# Patient Record
Sex: Female | Born: 1935
Health system: Southern US, Community
[De-identification: ages and names within clinical notes are randomized; demographics above are authoritative.]

---

## 1997-09-23 ENCOUNTER — Ambulatory Visit (HOSPITAL_COMMUNITY): Admission: RE | Admit: 1997-09-23 | Discharge: 1997-09-23 | Payer: Self-pay | Admitting: Internal Medicine

## 1998-12-30 ENCOUNTER — Encounter: Payer: Self-pay | Admitting: Emergency Medicine

## 1998-12-30 ENCOUNTER — Emergency Department (HOSPITAL_COMMUNITY): Admission: EM | Admit: 1998-12-30 | Discharge: 1998-12-30 | Payer: Self-pay | Admitting: Emergency Medicine

## 2002-06-12 ENCOUNTER — Other Ambulatory Visit: Admission: RE | Admit: 2002-06-12 | Discharge: 2002-06-12 | Payer: Self-pay | Admitting: Internal Medicine

## 2004-05-14 ENCOUNTER — Ambulatory Visit: Payer: Self-pay | Admitting: Internal Medicine

## 2004-08-10 ENCOUNTER — Ambulatory Visit: Payer: Self-pay | Admitting: Internal Medicine

## 2004-08-17 ENCOUNTER — Ambulatory Visit: Payer: Self-pay | Admitting: Internal Medicine

## 2004-08-19 ENCOUNTER — Encounter: Admission: RE | Admit: 2004-08-19 | Discharge: 2004-08-19 | Payer: Self-pay | Admitting: Internal Medicine

## 2004-09-15 ENCOUNTER — Ambulatory Visit (HOSPITAL_COMMUNITY): Admission: RE | Admit: 2004-09-15 | Discharge: 2004-09-15 | Payer: Self-pay | Admitting: Urology

## 2004-10-23 ENCOUNTER — Ambulatory Visit (HOSPITAL_COMMUNITY): Admission: RE | Admit: 2004-10-23 | Discharge: 2004-10-23 | Payer: Self-pay | Admitting: Urology

## 2004-10-23 ENCOUNTER — Ambulatory Visit (HOSPITAL_BASED_OUTPATIENT_CLINIC_OR_DEPARTMENT_OTHER): Admission: RE | Admit: 2004-10-23 | Discharge: 2004-10-23 | Payer: Self-pay | Admitting: Urology

## 2004-12-04 ENCOUNTER — Ambulatory Visit (HOSPITAL_COMMUNITY): Admission: RE | Admit: 2004-12-04 | Discharge: 2004-12-04 | Payer: Self-pay | Admitting: Urology

## 2005-02-21 ENCOUNTER — Emergency Department (HOSPITAL_COMMUNITY): Admission: EM | Admit: 2005-02-21 | Discharge: 2005-02-21 | Payer: Self-pay | Admitting: Emergency Medicine

## 2005-04-09 ENCOUNTER — Ambulatory Visit: Payer: Self-pay | Admitting: Internal Medicine

## 2005-04-09 ENCOUNTER — Ambulatory Visit (HOSPITAL_COMMUNITY): Admission: RE | Admit: 2005-04-09 | Discharge: 2005-04-09 | Payer: Self-pay | Admitting: Specialist

## 2005-05-04 ENCOUNTER — Ambulatory Visit: Payer: Self-pay | Admitting: Internal Medicine

## 2005-05-26 ENCOUNTER — Ambulatory Visit: Payer: Self-pay | Admitting: Internal Medicine

## 2005-11-08 ENCOUNTER — Ambulatory Visit: Payer: Self-pay | Admitting: Internal Medicine

## 2007-01-03 ENCOUNTER — Encounter: Payer: Self-pay | Admitting: Internal Medicine

## 2007-01-03 DIAGNOSIS — K219 Gastro-esophageal reflux disease without esophagitis: Secondary | ICD-10-CM | POA: Insufficient documentation

## 2007-01-03 DIAGNOSIS — N951 Menopausal and female climacteric states: Secondary | ICD-10-CM | POA: Insufficient documentation

## 2007-01-03 DIAGNOSIS — N1330 Unspecified hydronephrosis: Secondary | ICD-10-CM

## 2007-01-04 ENCOUNTER — Encounter: Payer: Self-pay | Admitting: Internal Medicine

## 2007-01-04 ENCOUNTER — Ambulatory Visit: Payer: Self-pay | Admitting: Internal Medicine

## 2007-05-10 ENCOUNTER — Telehealth (INDEPENDENT_AMBULATORY_CARE_PROVIDER_SITE_OTHER): Payer: Self-pay | Admitting: *Deleted

## 2007-05-11 ENCOUNTER — Ambulatory Visit: Payer: Self-pay | Admitting: Internal Medicine

## 2009-06-05 ENCOUNTER — Encounter: Payer: Self-pay | Admitting: Internal Medicine

## 2009-07-15 ENCOUNTER — Telehealth: Payer: Self-pay | Admitting: Internal Medicine

## 2009-07-15 ENCOUNTER — Ambulatory Visit: Payer: Self-pay | Admitting: Internal Medicine

## 2009-07-15 LAB — CONVERTED CEMR LAB
ALT: 13 units/L (ref 0–35)
AST: 21 units/L (ref 0–37)
Albumin: 3.8 g/dL (ref 3.5–5.2)
Basophils Absolute: 0.1 10*3/uL (ref 0.0–0.1)
Chloride: 109 meq/L (ref 96–112)
Eosinophils Absolute: 0.2 10*3/uL (ref 0.0–0.7)
Eosinophils Absolute: 0.2 10*3/uL (ref 0.0–0.7)
GFR calc non Af Amer: 74.88 mL/min (ref >60)
Glucose, Bld: 100 mg/dL — ABNORMAL HIGH (ref 70–99)
HCT: 25.3 % — ABNORMAL LOW (ref 36.0–46.0)
HCT: 27 % — ABNORMAL LOW (ref 36.0–46.0)
Hemoglobin: 7.5 g/dL — CL (ref 12.0–15.0)
Hemoglobin: 7.4 g/dL — ABNORMAL LOW (ref 12.0–15.0)
MCHC: 29.6 g/dL — ABNORMAL LOW (ref 30.0–36.0)
MCHC: 27.4 g/dL — ABNORMAL LOW (ref 30.0–36.0)
MCV: 67.3 fL — ABNORMAL LOW (ref 78.0–100.0)
Monocytes Relative: 14 % — ABNORMAL HIGH (ref 3–12)
Neutro Abs: 2.7 10*3/uL (ref 1.7–7.7)
Neutrophils Relative %: 54 % (ref 43–77)
Platelets: 356 10*3/uL (ref 150–400)
RBC: 4.01 M/uL (ref 3.87–5.11)
Sodium: 143 meq/L (ref 135–145)
TSH: 1.07 microintl units/mL (ref 0.35–5.50)
Total Protein: 6.9 g/dL (ref 6.0–8.3)

## 2009-07-16 ENCOUNTER — Telehealth: Payer: Self-pay | Admitting: Internal Medicine

## 2009-07-17 ENCOUNTER — Ambulatory Visit: Payer: Self-pay | Admitting: Internal Medicine

## 2009-07-17 DIAGNOSIS — D500 Iron deficiency anemia secondary to blood loss (chronic): Secondary | ICD-10-CM

## 2009-07-17 LAB — CONVERTED CEMR LAB
Basophils Absolute: 0 10*3/uL (ref 0.0–0.1)
Basophils Relative: 0 % (ref 0.0–3.0)
Eosinophils Relative: 4.4 % (ref 0.0–5.0)
Ferritin: 4 ng/mL — ABNORMAL LOW (ref 10.0–291.0)
Hemoglobin: 7.5 g/dL — CL (ref 12.0–15.0)
Lymphocytes Relative: 17.6 % (ref 12.0–46.0)
MCHC: 29.4 g/dL — ABNORMAL LOW (ref 30.0–36.0)
Monocytes Absolute: 0.4 10*3/uL (ref 0.1–1.0)
Monocytes Relative: 5.8 % (ref 3.0–12.0)
Neutro Abs: 4.6 10*3/uL (ref 1.4–7.7)
Neutrophils Relative %: 72.2 % (ref 43.0–77.0)
Platelets: 379 10*3/uL (ref 150.0–400.0)
RBC: 3.83 M/uL — ABNORMAL LOW (ref 3.87–5.11)

## 2009-07-18 ENCOUNTER — Telehealth: Payer: Self-pay | Admitting: Internal Medicine

## 2009-07-18 ENCOUNTER — Ambulatory Visit: Payer: Self-pay | Admitting: Gastroenterology

## 2009-07-18 ENCOUNTER — Encounter: Payer: Self-pay | Admitting: Gastroenterology

## 2009-07-18 DIAGNOSIS — R195 Other fecal abnormalities: Secondary | ICD-10-CM

## 2009-07-18 DIAGNOSIS — D509 Iron deficiency anemia, unspecified: Secondary | ICD-10-CM

## 2009-07-21 ENCOUNTER — Ambulatory Visit: Payer: Self-pay | Admitting: Gastroenterology

## 2009-07-21 ENCOUNTER — Encounter: Payer: Self-pay | Admitting: Physician Assistant

## 2009-07-21 LAB — CONVERTED CEMR LAB
Iron: 10 ug/dL — ABNORMAL LOW (ref 42–145)
Lymphocytes Relative: 22.8 % (ref 12.0–46.0)
MCHC: 30.4 g/dL (ref 30.0–36.0)
MCV: 65.7 fL — ABNORMAL LOW (ref 78.0–100.0)
Monocytes Relative: 10.6 % (ref 3.0–12.0)
Platelets: 352 10*3/uL (ref 150.0–400.0)
RBC: 3.84 M/uL — ABNORMAL LOW (ref 3.87–5.11)
RDW: 20.6 % — ABNORMAL HIGH (ref 11.5–14.6)
WBC: 6.1 10*3/uL (ref 4.5–10.5)

## 2009-07-22 ENCOUNTER — Telehealth: Payer: Self-pay | Admitting: Gastroenterology

## 2009-07-24 ENCOUNTER — Encounter: Payer: Self-pay | Admitting: Gastroenterology

## 2009-08-19 ENCOUNTER — Ambulatory Visit: Payer: Self-pay | Admitting: Gastroenterology

## 2009-08-22 LAB — CONVERTED CEMR LAB
Basophils Relative: 1.8 % (ref 0.0–3.0)
Eosinophils Absolute: 0.2 10*3/uL (ref 0.0–0.7)
Eosinophils Relative: 3.6 % (ref 0.0–5.0)
Ferritin: 5.8 ng/mL — ABNORMAL LOW (ref 10.0–291.0)
Folate: >20 ng/mL
Lymphs Abs: 1.3 10*3/uL (ref 0.7–4.0)
Monocytes Absolute: 0.6 10*3/uL (ref 0.1–1.0)
Monocytes Relative: 9.5 % (ref 3.0–12.0)
RBC: 4.45 M/uL (ref 3.87–5.11)
Vitamin B-12: 613 pg/mL (ref 211–911)

## 2009-08-25 ENCOUNTER — Encounter: Payer: Self-pay | Admitting: Gastroenterology

## 2009-08-25 ENCOUNTER — Ambulatory Visit: Payer: Self-pay | Admitting: Internal Medicine

## 2009-08-25 DIAGNOSIS — B029 Zoster without complications: Secondary | ICD-10-CM | POA: Insufficient documentation

## 2009-09-01 ENCOUNTER — Ambulatory Visit: Payer: Self-pay | Admitting: Internal Medicine

## 2009-09-02 ENCOUNTER — Encounter (HOSPITAL_COMMUNITY): Admission: RE | Admit: 2009-09-02 | Discharge: 2009-12-01 | Payer: Self-pay | Admitting: Surgery

## 2009-09-02 ENCOUNTER — Telehealth: Payer: Self-pay | Admitting: Gastroenterology

## 2009-09-05 ENCOUNTER — Ambulatory Visit: Payer: Self-pay | Admitting: Internal Medicine

## 2009-09-05 ENCOUNTER — Telehealth: Payer: Self-pay | Admitting: Internal Medicine

## 2009-09-08 ENCOUNTER — Telehealth: Payer: Self-pay

## 2009-09-10 ENCOUNTER — Encounter: Payer: Self-pay | Admitting: Gastroenterology

## 2009-09-11 ENCOUNTER — Encounter: Payer: Self-pay | Admitting: Internal Medicine

## 2009-09-11 ENCOUNTER — Telehealth: Payer: Self-pay | Admitting: Internal Medicine

## 2009-09-15 ENCOUNTER — Ambulatory Visit: Payer: Self-pay | Admitting: Internal Medicine

## 2009-09-25 ENCOUNTER — Telehealth: Payer: Self-pay | Admitting: Internal Medicine

## 2009-09-29 ENCOUNTER — Ambulatory Visit: Payer: Self-pay | Admitting: Family Medicine

## 2009-10-07 ENCOUNTER — Ambulatory Visit: Payer: Self-pay | Admitting: Internal Medicine

## 2009-10-07 DIAGNOSIS — B0229 Other postherpetic nervous system involvement: Secondary | ICD-10-CM | POA: Insufficient documentation

## 2009-10-20 ENCOUNTER — Ambulatory Visit: Payer: Self-pay | Admitting: Physician Assistant

## 2009-10-20 LAB — CONVERTED CEMR LAB: Transferrin: 216.5 mg/dL (ref 212.0–360.0)

## 2009-10-21 ENCOUNTER — Ambulatory Visit: Payer: Self-pay | Admitting: Internal Medicine

## 2009-10-22 ENCOUNTER — Telehealth: Payer: Self-pay | Admitting: Internal Medicine

## 2009-10-28 ENCOUNTER — Telehealth: Payer: Self-pay

## 2009-11-13 ENCOUNTER — Telehealth: Payer: Self-pay

## 2009-11-14 ENCOUNTER — Encounter: Payer: Self-pay | Admitting: Internal Medicine

## 2010-01-09 ENCOUNTER — Ambulatory Visit: Payer: Self-pay | Admitting: Internal Medicine

## 2010-01-09 LAB — CONVERTED CEMR LAB
BUN: 16 mg/dL (ref 6–23)
CO2: 30 meq/L (ref 19–32)
Chloride: 108 meq/L (ref 96–112)
Creatinine, Ser: 0.7 mg/dL (ref 0.4–1.2)
Eosinophils Relative: 3.2 % (ref 0.0–5.0)
GFR calc non Af Amer: 91.76 mL/min (ref >60)
Glucose, Bld: 95 mg/dL (ref 70–99)
HCT: 37.6 % (ref 36.0–46.0)
Lymphocytes Relative: 20 % (ref 12.0–46.0)
MCV: 90.8 fL (ref 78.0–100.0)
Monocytes Relative: 9.1 % (ref 3.0–12.0)
Neutrophils Relative %: 66.8 % (ref 43.0–77.0)
Platelets: 221 10*3/uL (ref 150.0–400.0)
Potassium: 4.8 meq/L (ref 3.5–5.1)
RBC: 4.14 M/uL (ref 3.87–5.11)

## 2010-05-04 ENCOUNTER — Ambulatory Visit: Payer: Self-pay | Admitting: Internal Medicine

## 2010-07-28 NOTE — Assessment & Plan Note (Signed)
Summary: COUGH/FILL IN PAPER FOR DMV/CJR   Vital Signs:  Patient profile:   76 year old female Weight:      112 pounds Temp:     98.5 degrees F oral Pulse rate:   120 / minute Pulse rhythm:   regular BP sitting:   140 / 86  (left arm) Cuff size:   regular  Vitals Entered By: Raechel Ache, RN (July 15, 2009 3:26 PM) CC: Needs form for DMV. C/o dry cough x 1 month.   CC:  Needs form for DMV. C/o dry cough x 1 month..  History of Present Illness: 76 year old patient who is seen today with the chief complaint of cough for 3 weeks duration.  Cough is  nonproductive, but is refractory,  bothersome and interfering with sleep, and work.  denies any shortness of breath, chest pain, fever or chills.  Her main reason for the visit today is DMV form completion.  She does have a history of visual impairmentand has seen Dr. Ashley Royalty in the past.  It is unclear that she has any medical conditions that interfere with her ability to safely operate a motor vehicle.  Her last visit here was over two years ago for a DMV exam.  She takes no chronic medications except OTC omeprazole.  Preventive Screening-Counseling & Management  Alcohol-Tobacco     Smoking Status: never  Allergies (verified): 1)  ! Bactrim (Sulfamethoxazole-Trimethoprim) 2)  ! Bicillin L-A (Penicillin G Benzathine) 3)  ! Biaxin (Clarithromycin)  Past History:  Past Medical History: Reviewed history from 01/03/2007 and no changes required. Menopausal syndrome GERD Bilateral hydronephrosis  Past Surgical History: Tonsillectomy G1P1A0  Family History: followed is 34, coronary artery disease mother died age 35, colon cancer one brother two sisters  Social History: Reviewed history and no changes required. Never Smoked one child, one grandchild  Review of Systems       The patient complains of severe indigestion/heartburn.  The patient denies anorexia, fever, weight loss, weight gain, vision loss, decreased  hearing, hoarseness, chest pain, syncope, dyspnea on exertion, peripheral edema, prolonged cough, headaches, hemoptysis, abdominal pain, melena, hematochezia, hematuria, incontinence, genital sores, muscle weakness, suspicious skin lesions, transient blindness, difficulty walking, depression, unusual weight change, abnormal bleeding, enlarged lymph nodes, angioedema, and breast masses.    Physical Exam  General:  underweight appearing.  anxious in no acute distress blood pressure 130/80underweight appearing.   Head:  Normocephalic and atraumatic without obvious abnormalities. No apparent alopecia or balding. Eyes:  No corneal or conjunctival inflammation noted. EOMI. Perrla. Funduscopic exam benign, without hemorrhages, exudates or papilledema. Vision grossly normal. Ears:  External ear exam shows no significant lesions or deformities.  Otoscopic examination reveals clear canals, tympanic membranes are intact bilaterally without bulging, retraction, inflammation or discharge. Hearing is grossly normal bilaterally. Nose:  External nasal examination shows no deformity or inflammation. Nasal mucosa are pink and moist without lesions or exudates. Mouth:  Oral mucosa and oropharynx without lesions or exudates.  Teeth in good repair. Neck:  No deformities, masses, or tenderness noted. Chest Wall:  No deformities, masses, or tenderness noted. Lungs:  Normal respiratory effort, chest expands symmetrically. Lungs are clear to auscultation, no crackles or wheezes. O2 saturation 98% Heart:  Normal rate and regular rhythm. S1 and S2 normal without gallop, murmur, click, rub or other extra sounds.  pulse rate 104 Abdomen:  Bowel sounds positive,abdomen soft and non-tender without masses, organomegaly or hernias noted. Msk:  osteoarthritic  changes involving the small joints of the  hands Pulses:  R and L carotid,radial,femoral,dorsalis pedis and posterior tibial pulses are full and equal  bilaterally Extremities:  No clubbing, cyanosis, edema, or deformity noted with normal full range of motion of all joints.   Skin:  Intact without suspicious lesions or rashes Cervical Nodes:  No lymphadenopathy noted   Impression & Recommendations:  Problem # 1:  HYDRONEPHROSIS, BILATERAL (ICD-591)  Problem # 2:  GERD (ICD-530.81)  Her updated medication list for this problem includes:    Prilosec Otc 20 Mg Tbec (Omeprazole magnesium) .Marland Kitchen... 1 once daily    Her updated medication list for this problem includes:    Prilosec Otc 20 Mg Tbec (Omeprazole magnesium) .Marland Kitchen... 1 once daily  Problem # 3:  MENOPAUSAL SYNDROME (ICD-627.2)  Complete Medication List: 1)  Prilosec Otc 20 Mg Tbec (Omeprazole magnesium) .Marland Kitchen.. 1 once daily 2)  Hydrocodone-homatropine 5-1.5 Mg/30ml Syrp (Hydrocodone-homatropine) .... One tsp every 6 hours for cough  Other Orders: Venipuncture ([address]) TLB-BMP (Basic Metabolic Panel-BMET) (80048-METABOL) TLB-CBC Platelet - w/Differential (85025-CBCD) TLB-Hepatic/Liver Function Pnl (80076-HEPATIC) TLB-TSH (Thyroid Stimulating Hormone) (84443-TSH)  Patient Instructions: 1)  Please schedule a follow-up appointment in 4 months CPX 2)  Limit your Sodium (Salt) to less than 2 grams a day(slightly less than 1/2 a teaspoon) to prevent fluid retention, swelling, or worsening of symptoms. 3)  Avoid foods high in acid (tomatoes, citrus juices, spicy foods). Avoid eating within two hours of lying down or before exercising. Do not over eat; try smaller more frequent meals. Elevate head of bed twelve inches when sleeping. 4)  It is important that you exercise regularly at least 20 minutes 5 times a week. If you develop chest pain, have severe difficulty breathing, or feel very tired , stop exercising immediately and seek medical attention. 5)  Take calcium +Vitamin D daily. Prescriptions: HYDROCODONE-HOMATROPINE 5-1.5 MG/5ML SYRP (HYDROCODONE-HOMATROPINE) one tsp every 6 hours for  cough  #6 oz x 0   Entered and Authorized by:   Gordy Savers  MD   Signed by:   Gordy Savers  MD on 07/15/2009   Method used:   Print then Give to Patient   RxID:   [id number]   Appended Document: Orders Update    Clinical Lists Changes  Orders: Added new Test order of T- * Misc. Laboratory test [phone]) - Signed

## 2010-07-28 NOTE — Progress Notes (Signed)
Summary: Critical value  Phone Note Other Incoming   Caller: Lab Reason for Call: Discuss lab or test results Action Taken: Information Sent Summary of Call: Critical value: Hgb:  7.5 gm Hct:  25.6 Initial call taken by: Lynann Beaver CMA,  July 18, 2009 2:02 PM

## 2010-07-28 NOTE — Assessment & Plan Note (Signed)
Summary: 1 month fup//ccm   Vital Signs:  Patient profile:   76 year old female Weight:      114 pounds Temp:     98.1 degrees F oral BP sitting:   140 / 80  (right arm) Cuff size:   regular  Vitals Entered By: Duard Brady LPN (October 21, 2009 3:17 PM) CC: 1 mos rov - no better - "cant work" Is Patient Diabetic? No   Primary Care Provider:  Eleonore Chiquito MD  CC:  1 mos rov - no better - "cant work".  History of Present Illness: a 76 year old patient who is in today for severe postherpetic neuralgia.  Two scheduled to return to work today, but she is unable due to her chest wall pain.  She remains fairly miserable and states that she can't even hardly bear to light touch of clothing to the area.  Her rash has totally resolved.  She is on low-dose Neurontin, but has been unable to tolerate any higher dosing due to weakness and dizziness  Allergies: 1)  ! Bactrim (Sulfamethoxazole-Trimethoprim) 2)  ! Bicillin L-A (Penicillin G Benzathine) 3)  ! Biaxin (Clarithromycin)  Past History:  Past Medical History: Menopausal syndrome GERD Bilateral hydronephrosis microcytic anemia/NEW 1/11 shingles February 2011 postherpetic neuralgia  Physical Exam  General:  alert  underweight appearing.  normal blood pressure, weight 114underweight appearing.   Chest Wall:  no dermatitis   Impression & Recommendations:  Problem # 1:  POSTHERPETIC NEURALGIA (ICD-053.19)  Problem # 2:  ANEMIA-IRON DEFICIENCY (ICD-280.9)  Her updated medication list for this problem includes:    Nu-iron 150 Mg Caps (Polysaccharide iron complex) .Marland Kitchen... Take 1 tab twice daily  Her updated medication list for this problem includes:    Nu-iron 150 Mg Caps (Polysaccharide iron complex) .Marland Kitchen... Take 1 tab twice daily  Complete Medication List: 1)  Prilosec Otc 20 Mg Tbec (Omeprazole magnesium) .Marland Kitchen.. 1 once daily 2)  Hydrocodone-homatropine 5-1.5 Mg/34ml Syrp (Hydrocodone-homatropine) .... One tsp every 6  hours for cough 3)  Nu-iron 150 Mg Caps (Polysaccharide iron complex) .... Take 1 tab twice daily 4)  Dexilant 60 Mg Cpdr (Dexlansoprazole) .Marland Kitchen.. 1 by mouth qd 5)  Valtrex 1 Gm Tabs (Valacyclovir hcl) .... One tid 6)  Hydrocodone-acetaminophen 5-500 Mg Tabs (Hydrocodone-acetaminophen) .... One or two every 6 hours for pain 7)  Lidoderm 5 % Ptch (Lidocaine) .... Apply every 12-24 hrs 8)  Gabapentin 100 Mg Caps (Gabapentin) .... One capsule 3 times daily 9)  Nortriptyline Hcl 10 Mg Caps (Nortriptyline hcl) .... One at bedtime  Patient Instructions: 1)  Please schedule a follow-up appointment in 3 months. Prescriptions: NORTRIPTYLINE HCL 10 MG CAPS (NORTRIPTYLINE HCL) one at bedtime  #90 x 0   Entered and Authorized by:   Gordy Savers  MD   Signed by:   Gordy Savers  MD on 10/21/2009   Method used:   [id number]       [address]       [place], [place]  [address]       Ph: [phone] or [phone]       Fax: [phone]   RxID:   [id number]

## 2010-07-28 NOTE — Progress Notes (Signed)
  Phone Note Outgoing Call   Call placed by: Raechel Ache, RN,  July 16, 2009 8:22 AM Call placed to: k Summary of Call: [facility] ASAP  Follow-up for Phone Call        no longer at work #- not employed there. Follow-up by: Raechel Ache, RN,  July 16, 2009 12:30 PM  Additional Follow-up for Phone Call Additional follow up Details #1::        needs appt with Dr Kirtland Bouchard ASAP! Additional Follow-up by: Raechel Ache, RN,  July 16, 2009 12:30 PM    Additional Follow-up for Phone Call Additional follow up Details #2::    left message on machine- call us ASAP- important! Follow-up by: Raechel Ache, RN,  July 17, 2009 9:49 AM  Additional Follow-up for Phone Call Additional follow up Details #3:: Details for Additional Follow-up Action Taken: patient called- will come in today for appt 3:30 - will call if she cannot get off work. I stressed importance of visit Additional Follow-up by: Raechel Ache, RN,  July 17, 2009 12:39 PM

## 2010-07-28 NOTE — Assessment & Plan Note (Signed)
Summary: ? shingles//ccm   Vital Signs:  Patient profile:   76 year old female Temp:     98 degrees F oral BP sitting:   160 / 90  (left arm) Cuff size:   regular  Vitals Entered By: Sid Falcon LPN (September 29, 1608 5:12 PM) CC: Shingles, ongoing pain   History of Present Illness: Followup shingles. Diagnosed last month. Involvement of left thoracic region around toward the breast. Severe neuropathic pain. Currently on gabapentin 200 mg 3 times daily with poor pain control. Also using hydrocodone with minimal improvement and Cymbalta 30 mg daily. Has been out of work for several weeks. Patient has burning ,stinging pain which is severe at times.  Poor sleep sec to pain.  Allergies: 1)  ! Bactrim (Sulfamethoxazole-Trimethoprim) 2)  ! Bicillin L-A (Penicillin G Benzathine) 3)  ! Biaxin (Clarithromycin)  Past History:  Past Medical History: Last updated: 08/25/2009 Menopausal syndrome GERD Bilateral hydronephrosis microcytic anemia/NEW 1/11 shingles February 2011 PMH reviewed for relevance  Physical Exam  General:  Well-developed,well-nourished,in no acute distress; alert,appropriate and cooperative throughout examination Lungs:  Normal respiratory effort, chest expands symmetrically. Lungs are clear to auscultation, no crackles or wheezes. Heart:  normal rate and regular rhythm.   Skin:  healing rash left thoracic region   Impression & Recommendations:  Problem # 1:  SHINGLES (ICD-053.9) patient has persistent postherpetic neuralgia. Titrate gabapentin 300 mg t.i.d.  Complete Medication List: 1)  Prilosec Otc 20 Mg Tbec (Omeprazole magnesium) .Marland Kitchen.. 1 once daily 2)  Hydrocodone-homatropine 5-1.5 Mg/59ml Syrp (Hydrocodone-homatropine) .... One tsp every 6 hours for cough 3)  Nu-iron 150 Mg Caps (Polysaccharide iron complex) .... Take 1 tab twice daily 4)  Dexilant 60 Mg Cpdr (Dexlansoprazole) .Marland Kitchen.. 1 by mouth qd 5)  Valtrex 1 Gm Tabs (Valacyclovir hcl) .... One tid 6)   Hydrocodone-acetaminophen 5-500 Mg Tabs (Hydrocodone-acetaminophen) .... One or two every 6 hours for pain 7)  Gabapentin 300 Mg Caps (Gabapentin) .... One by mouth three times a day 8)  Lidoderm 5 % Ptch (Lidocaine) .... Apply every 12-24 hrs  Patient Instructions: 1)  Increase gabapentin to 300 mg 3 times daily. 2)  Continue hydrocodone as needed at nighttime for relief Prescriptions: GABAPENTIN 300 MG CAPS (GABAPENTIN) one by mouth three times a day  #90 x 3   Entered and Authorized by:   Evelena Peat MD   Signed by:   Evelena Peat MD on 09/29/2009   Method used:   [facility]  [id number]       [address]       [place], [place]  [address]       Ph: [phone] or [phone]       Fax: [phone]   RxID:   [id number]

## 2010-07-28 NOTE — Letter (Signed)
Summary: Return to Work Letter  Return to Work Letter   Imported By: Maryln Gottron 11/18/2009 15:22:10  _____________________________________________________________________  External Attachment:    Type:   Image     Comment:   External Document

## 2010-07-28 NOTE — Op Note (Signed)
Summary: Infusion/[facility]  Infusion/[facility]   Imported By: Sherian Rein 09/22/2009 07:49:50  _____________________________________________________________________  External Attachment:    Type:   Image     Comment:   External Document

## 2010-07-28 NOTE — Progress Notes (Signed)
Summary: work release  work release faxed and pt aware original ready for pick up. KIK

## 2010-07-28 NOTE — Letter (Signed)
Summary: Medical Review for [facility]  Medical Review for DMV   Imported By: Maryln Gottron 09/16/2009 12:22:33  _____________________________________________________________________  External Attachment:    Type:   Image     Comment:   External Document

## 2010-07-28 NOTE — Letter (Signed)
Summary: Out of Work  Adult nurse at [employer]  [address]   [place], [place] [address]   Phone: [phone]  Fax: [phone]    September 15, 2009   Employee:  Neill Loft    To Whom It May Concern:   For Medical reasons, please excuse the above named employee from work for the following dates:  Start:   09-01-09  End:   09-30-09  If you need additional information, please feel free to contact our office.         Sincerely,    Gordy Savers  MD

## 2010-07-28 NOTE — Assessment & Plan Note (Signed)
Summary: 3 mo rov/mm/pt rescd//ccm----PT [facility] // RS---PT [facility]// RS  aand a the and and a the and a chief in the we will be a  Vital Signs:  Patient profile:   76 year old female Weight:      116 pounds Temp:     98.0 degrees F oral BP sitting:   112 / 80  (right arm) Cuff size:   regular  Vitals Entered By: Duard Brady LPN (January 09, 2010 1:37 PM) Lyndee Leo, [place] andCC: 3 mos rov - doing well Is Patient Diabetic? No   Primary Care Provider:  Eleonore Chiquito MD  CC:  3 mos rov - doing well.  History of Present Illness: a 76 year old patient who is seen today for follow-up.  She has suffered for several months with postherpetic neuralgia.  This now has resolved, and she is off medication.  She has a history of iron deficiency anemia, urinary retention with history of bilateral hydronephrosis.  She feels well today  Allergies: 1)  ! Bactrim (Sulfamethoxazole-Trimethoprim) 2)  ! Bicillin L-A (Penicillin G Benzathine) 3)  ! Biaxin (Clarithromycin)  Past History:  Past Medical History: Menopausal syndrome GERD Bilateral hydronephrosis microcytic anemia/NEW 1/11 shingles February 2011 postherpetic neuralgia chronic urinary retention  Past Surgical History: Tonsillectomy G1P1A0 no prior colonoscopies-declines urological procedure for hydronephrosis  Review of Systems  The patient denies anorexia, fever, weight loss, weight gain, vision loss, decreased hearing, hoarseness, chest pain, syncope, dyspnea on exertion, peripheral edema, prolonged cough, headaches, hemoptysis, abdominal pain, melena, hematochezia, severe indigestion/heartburn, hematuria, incontinence, genital sores, muscle weakness, suspicious skin lesions, transient blindness, difficulty walking, depression, unusual weight change, abnormal bleeding, enlarged lymph nodes, angioedema, and breast masses.    Physical Exam  General:  underweight appearing. normal blood pressureunderweight appearing.   Head:   Normocephalic and atraumatic without obvious abnormalities. No apparent alopecia or balding. Eyes:  No corneal or conjunctival inflammation noted. EOMI. Perrla. Funduscopic exam benign, without hemorrhages, exudates or papilledema. Vision grossly normal. Mouth:  Oral mucosa and oropharynx without lesions or exudates.  Teeth in good repair. Neck:  No deformities, masses, or tenderness noted. Lungs:  Normal respiratory effort, chest expands symmetrically. Lungs are clear to auscultation, no crackles or wheezes. Heart:  Normal rate and regular rhythm. S1 and S2 normal without gallop, murmur, click, rub or other extra sounds. Abdomen:  Bowel sounds positive,abdomen soft and non-tender without masses, organomegaly or hernias noted. Msk:  No deformity or scoliosis noted of thoracic or lumbar spine.     Impression & Recommendations:  Problem # 1:  POSTHERPETIC NEURALGIA (ICD-053.19)  Problem # 2:  ANEMIA-IRON DEFICIENCY (ICD-280.9)  Her updated medication list for this problem includes:    Nu-iron 150 Mg Caps (Polysaccharide iron complex) .Marland Kitchen... Take 1 tab twice daily  Her updated medication list for this problem includes:    Nu-iron 150 Mg Caps (Polysaccharide iron complex) .Marland Kitchen... Take 1 tab twice daily  Complete Medication List: 1)  Prilosec Otc 20 Mg Tbec (Omeprazole magnesium) .Marland Kitchen.. 1 once daily 2)  Nu-iron 150 Mg Caps (Polysaccharide iron complex) .... Take 1 tab twice daily 3)  Dexilant 60 Mg Cpdr (Dexlansoprazole) .Marland Kitchen.. 1 by mouth qd  Other Orders: Venipuncture ([address]) TLB-CBC Platelet - w/Differential (85025-CBCD) TLB-BMP (Basic Metabolic Panel-BMET) (80048-METABOL)  Patient Instructions: 1)  Please schedule a follow-up appointment in 6 months. 2)  Avoid foods high in acid (tomatoes, citrus juices, spicy foods). Avoid eating within two hours of lying down or before exercising. Do not over eat; try smaller more  frequent meals. Elevate head of bed twelve inches when  sleeping. Prescriptions: NU-IRON 150 MG CAPS (POLYSACCHARIDE IRON COMPLEX) Take 1 tab twice daily  #60 x 3   Entered and Authorized by:   Gordy Savers  MD   Signed by:   Gordy Savers  MD on 01/09/2010   Method used:   [facility]  [id number]       [address]       [place], [place]  [address]       Ph: [phone] or [phone]       Fax: [phone]   RxID:   [id number] DEXILANT 60 MG CPDR (DEXLANSOPRAZOLE) 1 by mouth qd  #30 x 11   Entered and Authorized by:   Gordy Savers  MD   Signed by:   Gordy Savers  MD on 01/09/2010   Method used:   [facility]        [facility]  [address] [id number]       [address]       [place], [place]  [address]       Ph: [phone] or [phone]       Fax: [phone]   RxID:   [id number]

## 2010-07-28 NOTE — Letter (Signed)
Summary: Out of Work  Adult nurse at [employer]  [address]   [place], [place] [address]   Phone: [phone]  Fax: [phone]    July 15, 2009   Employee:  Neill Loft    To Whom It May Concern:   For Medical reasons, please excuse the above named employee from work for the following dates:  Start:   07-15-2009  End:   07-16-2009  If you need additional information, please feel free to contact our office.         Sincerely,    Gordy Savers  MD

## 2010-07-28 NOTE — Letter (Signed)
Summary: Out of Work  Adult nurse at [employer]  [address]   [place], [place] [address]   Phone: [phone]  Fax: [phone]    January 09, 2010   Employee:  Neill Loft    To Whom It May Concern:   For Medical reasons, please excuse the above named employee from work for the following dates:  Start:   01-09-2010  End:   01-10-2010  If you need additional information, please feel free to contact our office.         Sincerely,    Gordy Savers  MD

## 2010-07-28 NOTE — Progress Notes (Signed)
  Phone Note From Other Clinic   Summary of Call: Notified by lab that patient has severe anemia with H/H 7.5/25.3.  Called and left patient a message Initial call taken by: Gordy Savers  MD,  July 15, 2009 5:30 PM

## 2010-07-28 NOTE — Assessment & Plan Note (Signed)
Summary: discuss pneumonia shots/dm  Nurse Visit   Allergies: 1)  ! Bactrim (Sulfamethoxazole-Trimethoprim) 2)  ! Bicillin L-A (Penicillin G Benzathine) 3)  ! Biaxin (Clarithromycin)  Immunizations Administered:  Pneumonia Vaccine:    Vaccine Type: Pneumovax    Site: right deltoid    Mfr: Merck    Dose: 0.5 ml    Route: IM    Given by: Duard Brady LPN    Exp. Date: 10/20/2011    Lot #: [id number]    VIS given: 06/02/09 version given May 04, 2010.    Physician counseled: yes  Orders Added: 1)  Pneumococcal Vaccine [90732] 2)  Admin 1st Vaccine [phone]

## 2010-07-28 NOTE — Miscellaneous (Signed)
Summary: RX NuIron  Clinical Lists Changes  Medications: Added new medication of NU-IRON 150 MG CAPS (POLYSACCHARIDE IRON COMPLEX) Take 1 tab twice daily - Signed Rx of NU-IRON 150 MG CAPS (POLYSACCHARIDE IRON COMPLEX) Take 1 tab twice daily;  #60 x 3;  Signed;  Entered by: Lowry Ram NCMA;  Authorized by: Sammuel Cooper PA-c;  Method used: [facility]  [address] [id number] [address] [place], [place], [place]  [address], Ph: [phone] or [phone], Fax: [phone]    Prescriptions: NU-IRON 150 MG CAPS (POLYSACCHARIDE IRON COMPLEX) Take 1 tab twice daily  #60 x 3   Entered by:   Lowry Ram NCMA   Authorized by:   Sammuel Cooper PA-c   Signed by:   Lowry Ram NCMA on 07/21/2009   Method used:   [facility]        [facility]  [address] [id number]       [address]       [place], [place]  [address]       Ph: [phone] or [phone]       Fax: [phone]   RxID:   [id number]

## 2010-07-28 NOTE — Progress Notes (Signed)
Summary: refills  Phone Note Call from Patient Call back at Home Phone [phone]   Caller: Patient Call For: Jarold Motto Reason for Call: Refill Medication, Talk to Nurse Summary of Call: Patient needs rx refills for her iron medication Initial call taken by: Tawni Levy,  September 02, 2009 10:53 AM  Follow-up for Phone Call        Rx refilled.  Pt notified. Follow-up by: Ashok Cordia RN,  September 02, 2009 12:30 PM    Prescriptions: NU-IRON 150 MG CAPS (POLYSACCHARIDE IRON COMPLEX) Take 1 tab twice daily  #60 x 3   Entered by:   Ashok Cordia RN   Authorized by:   Mardella Layman MD [facility]   Signed by:   Ashok Cordia RN on 09/02/2009   Method used:   [facility]        [facility]  [address] [id number]       [address]       [place], [place]  [address]       Ph: [phone] or [phone]       Fax: [phone]   RxID:   [id number]

## 2010-07-28 NOTE — Assessment & Plan Note (Signed)
Summary: ? shingles?/dm   Vital Signs:  Patient profile:   76 year old female Weight:      109 pounds Temp:     98.8 degrees F oral BP sitting:   160 / 76  (right arm)  Vitals Entered By: Duard Brady LPN (August 25, 2009 11:17 AM) ForCC: c.o rash on back - shingles? pain increasing, not sleeping , will need note for work Is Patient Diabetic? No   Primary Care Provider:  Eleonore Chiquito MD  CC:  c.o rash on back - shingles? pain increasing, not sleeping , and will need note for work.  History of Present Illness: 76 year old patient who has a history of iron deficiency anemia is status post a GI evaluation.  She remains on iron supplementation and is scheduled for parenteral iron therapy.  For the past two days.  She has noted pain and a rash involving her left posterior back region.  This has been quite painful.  Allergies: 1)  ! Bactrim (Sulfamethoxazole-Trimethoprim) 2)  ! Bicillin L-A (Penicillin G Benzathine) 3)  ! Biaxin (Clarithromycin)  Past History:  Past Medical History: Menopausal syndrome GERD Bilateral hydronephrosis microcytic anemia/NEW 1/11 shingles February 2011  Review of Systems       The patient complains of suspicious skin lesions.  The patient denies anorexia, fever, weight loss, weight gain, vision loss, decreased hearing, hoarseness, chest pain, syncope, dyspnea on exertion, peripheral edema, prolonged cough, headaches, hemoptysis, abdominal pain, melena, hematochezia, severe indigestion/heartburn, hematuria, incontinence, genital sores, muscle weakness, transient blindness, difficulty walking, depression, unusual weight change, abnormal bleeding, enlarged lymph nodes, angioedema, and breast masses.    Physical Exam  General:  uncomfortable but in no acute distress.  Blood pressure normal Skin:  herpetic rash in the left posterior back region   Impression & Recommendations:  Problem # 1:  SHINGLES (ICD-053.9)  Problem # 2:  ANEMIA,  SECONDARY TO CHRONIC BLOOD LOSS (ICD-280.0)  Her updated medication list for this problem includes:    Nu-iron 150 Mg Caps (Polysaccharide iron complex) .Marland Kitchen... Take 1 tab twice daily  Complete Medication List: 1)  Prilosec Otc 20 Mg Tbec (Omeprazole magnesium) .Marland Kitchen.. 1 once daily 2)  Hydrocodone-homatropine 5-1.5 Mg/73ml Syrp (Hydrocodone-homatropine) .... One tsp every 6 hours for cough 3)  Nu-iron 150 Mg Caps (Polysaccharide iron complex) .... Take 1 tab twice daily 4)  Dexilant 60 Mg Cpdr (Dexlansoprazole) .Marland Kitchen.. 1 by mouth qd 5)  Valtrex 1 Gm Tabs (Valacyclovir hcl) .... One tid 6)  Hydrocodone-acetaminophen 5-500 Mg Tabs (Hydrocodone-acetaminophen) .... One or two every 6 hours for pain  Patient Instructions: 1)  Please schedule a follow-up appointment in 1 month. Prescriptions: HYDROCODONE-ACETAMINOPHEN 5-500 MG TABS (HYDROCODONE-ACETAMINOPHEN) one or two every 6 hours for pain  #50 x 2   Entered and Authorized by:   Gordy Savers  MD   Signed by:   Gordy Savers  MD on 08/25/2009   Method used:   Print then Give to Patient   RxID:   [id number] VALTREX 1 GM TABS (VALACYCLOVIR HCL) one TID  #30 x 0   Entered and Authorized by:   Gordy Savers  MD   Signed by:   Gordy Savers  MD on 08/25/2009   Method used:   Print then Give to Patient   RxID:   [id number]

## 2010-07-28 NOTE — Progress Notes (Signed)
Summary: [employer] called re: pts dissability claim  Phone Note From Other Clinic Call back at [employer] [phone] Joni Reining    Caller: [employer]   -   Joni Reining Summary of Call: [employer] called re: pts dissability claim. Need to know when pt is suppose to be returning back to work. Please call to clarify.  Initial call taken by: Lucy Antigua,  Oct 28, 2009 1:09 PM  Follow-up for Phone Call        called Joni Reining -[facility] - [facility] if questions - pt was seen 4/26 - given a letter about unclear when to return to work -  will have rov in 3 mos - will make determination at that time. KIK Follow-up by: Duard Brady LPN,  Oct 29, 979 1:31 PM

## 2010-07-28 NOTE — Assessment & Plan Note (Signed)
Summary: F/U post EGD   History of Present Illness Visit Type: Follow-up Visit Primary GI MD: Elie Goody MD [facility] Primary Provider: Eleonore Chiquito MD Requesting Provider: Eleonore Chiquito MD Chief Complaint: follow-up EGD pt. has no problems or complaints at this time  Pt. had Dexilant samples but ran out and took Prilosec OTC   History of Present Illness:   Patient Is asymptomatic on daily Dexilant 60 mg a day. Endoscopy showed a large hiatal hernia with Sheria Lang erosions as the source of her chronic GI blood loss. She also is on Nu-Iron daily and denies NSAID abuse. She denies lower GI or hepatobiliary complaints.   GI Review of Systems      Denies abdominal pain, acid reflux, belching, bloating, chest pain, dysphagia with liquids, dysphagia with solids, heartburn, loss of appetite, nausea, vomiting, vomiting blood, weight loss, and  weight gain.        Denies anal fissure, black tarry stools, change in bowel habit, constipation, diarrhea, diverticulosis, fecal incontinence, heme positive stool, hemorrhoids, irritable bowel syndrome, jaundice, light color stool, liver problems, rectal bleeding, and  rectal pain.    Current Medications (verified): 1)  Prilosec Otc 20 Mg Tbec (Omeprazole Magnesium) .Marland Kitchen.. 1 Once Daily 2)  Hydrocodone-Homatropine 5-1.5 Mg/54ml Syrp (Hydrocodone-Homatropine) .... One Tsp Every 6 Hours For Cough 3)  Nu-Iron 150 Mg Caps (Polysaccharide Iron Complex) .... Take 1 Tab Twice Daily  Allergies (verified): 1)  ! Bactrim (Sulfamethoxazole-Trimethoprim) 2)  ! Bicillin L-A (Penicillin G Benzathine) 3)  ! Biaxin (Clarithromycin)  Past History:  Past medical, surgical, family and social histories (including risk factors) reviewed for relevance to current acute and chronic problems.  Past Medical History: Reviewed history from 07/18/2009 and no changes required. Menopausal syndrome GERD Bilateral hydronephrosis microcytic anemia/NEW 1/11  Past Surgical  History: Reviewed history from 07/17/2009 and no changes required. Tonsillectomy G1P1A0 no prior colonoscopies-declines  Family History: Reviewed history from 07/15/2009 and no changes required. followed is 67, coronary artery disease mother died age 23, colon cancer one brother two sisters  Social History: Reviewed history from 07/15/2009 and no changes required. Never Smoked one child, one grandchild Illicit Drug Use - no Occupation: Surveyor, minerals Alcohol Use - yes occasional Drug Use:  no  Review of Systems  The patient denies allergy/sinus, anemia, anxiety-new, arthritis/joint pain, back pain, blood in urine, breast changes/lumps, change in vision, confusion, cough, coughing up blood, depression-new, fainting, fatigue, fever, headaches-new, hearing problems, heart murmur, heart rhythm changes, itching, muscle pains/cramps, night sweats, nosebleeds, shortness of breath, skin rash, sleeping problems, sore throat, swelling of feet/legs, swollen lymph glands, thirst - excessive, urination - excessive, urination changes/pain, urine leakage, vision changes, and voice change.    Vital Signs:  Patient profile:   76 year old female Height:      64 inches Weight:      109.50 pounds BMI:     18.86 Pulse rate:   92 / minute Pulse rhythm:   regular BP sitting:   136 / 86  (left arm)  Vitals Entered By: Milford Cage NCMA (August 19, 2009 4:03 PM)  Physical Exam  General:  Well developed, well nourished, no acute distress. Head:  Normocephalic and atraumatic. Eyes:  PERRLA, no icterus.exam deferred to patient's ophthalmologist.  exam deferred to patient's ophthalmologist.   Psych:  Alert and cooperative. Normal mood and affect.   Impression & Recommendations:  Problem # 1:  GERD (ICD-530.81) Assessment Improved Continue reflux maneuvers and daily Dexilant 60 mg q.a.m. Orders: TLB-B12, Serum-Total ONLY (  11914-N82) TLB-Ferritin (82728-FER) TLB-Folic Acid (Folate)  (82746-FOL) TLB-IBC Pnl (Iron/FE;Transferrin) (83550-IBC) TLB-CBC Platelet - w/Differential (85025-CBCD)  Problem # 2:  ANEMIA-IRON DEFICIENCY (ICD-280.9) Assessment: Improved Repeat CBC and iron studies and advise on continued iron treatment Orders: TLB-B12, Serum-Total ONLY (95621-H08) TLB-Ferritin (82728-FER) TLB-Folic Acid (Folate) (82746-FOL) TLB-IBC Pnl (Iron/FE;Transferrin) (83550-IBC) TLB-CBC Platelet - w/Differential (85025-CBCD)  Patient Instructions: 1)  Copy sent to : Dr. Beverely Low 2)  Please continue current medications.  3)  Avoid foods high in acid content ( tomatoes, citrus juices, spicy foods) . Avoid eating within 3 to 4 hours of lying down or before exercising. Do not over eat; try smaller more frequent meals. Elevate head of bed four inches when sleeping.  4)  Please schedule a follow-up appointment as needed.  5)  Begin Dexilant daily. 6)  The medication list was reviewed and reconciled.  All changed / newly prescribed medications were explained.  A complete medication list was provided to the patient / caregiver. Prescriptions: DEXILANT 60 MG CPDR (DEXLANSOPRAZOLE) 1 by mouth qd  #30 x 11   Entered by:   Ashok Cordia RN   Authorized by:   Mardella Layman MD [facility]   Signed by:   Ashok Cordia RN on 08/19/2009   Method used:   [facility]  [id number]       [address]       [place], [place]  [address]       Ph: [phone] or [phone]       Fax: [phone]   RxID:   [id number]

## 2010-07-28 NOTE — Progress Notes (Signed)
Summary: diaibility co info  Phone Note Other Incoming   Caller: Rory Percy from [employer] Reason for Call: Get patient information Summary of Call: rec'd ppwk for diaiblilty claim, needs to know if there were any complications - why out of work 2/28 thru 3/21(rov) . call [phone] Initial call taken by: Duard Brady LPN,  September 08, 2009 1:13 PM  Follow-up for Phone Call        called Rory Percy -with [facility] - discussed pain assoc. with shingles, other dx anemia , and her rov.  Follow-up by: Duard Brady LPN,  September 08, 2009 1:18 PM

## 2010-07-28 NOTE — Progress Notes (Signed)
Summary: Out of work how long?  Phone Note Other Incoming   Caller: [employer] Reason for Call: Get patient information Summary of Call: needs to know how long pt is to be out of work and when next appt is to be. Office visit 10/21/09 indicated only that she is still out of work. can call back at [phone]  Will send to Dr, Amador Cunas to advised.    Initial call taken by: Duard Brady LPN,  October 22, 2009 11:29 AM  Follow-up for Phone Call        she received an open ended letter stating that she is disabled at this time; unclear when she will be to retrurn to work; the patient may chose a return to work date Follow-up by: Gordy Savers  MD,  October 22, 2009 11:44 AM  Additional Follow-up for Phone Call Additional follow up Details #1::        spoke with Bonnye Fava - as of last office visit - will f/u in 3 mos - KIK Additional Follow-up by: Duard Brady LPN,  October 22, 2009 12:49 PM

## 2010-07-28 NOTE — Letter (Signed)
Summary: [facility] Instructions  [facility]  [address] [place], [place] [address]   Phone: [phone]  Fax: [phone]       DELMA DRONE    1935/03/03    MRN: [id number]        Procedure Day /Date: 07-21-09     Arrival Time: 1:30 PM     Procedure Time: 2:30 PM     Location of Procedure:                    X     [facility] (4th Floor)  PREPARATION FOR COLONOSCOPY WITH MOVIPREP   Starting 5 days prior to your procedure 1-21-11do not eat nuts, seeds, popcorn, corn, beans, peas,  salads, or any raw vegetables.  Do not take any fiber supplements (e.g. Metamucil, Citrucel, and Benefiber).  THE DAY BEFORE YOUR PROCEDURE         DATE: 07-20-09 DAY: Sunday  1.  Drink clear liquids the entire day-NO SOLID FOOD  2.  Do not drink anything colored red or purple.  Avoid juices with pulp.  No orange juice.  3.  Drink at least 64 oz. (8 glasses) of fluid/clear liquids during the day to prevent dehydration and help the prep work efficiently.  CLEAR LIQUIDS INCLUDE: Water Jello Ice Popsicles Tea (sugar ok, no milk/cream) Powdered fruit flavored drinks Coffee (sugar ok, no milk/cream) Gatorade Juice: apple, white grape, white cranberry  Lemonade Clear bullion, consomm, broth Carbonated beverages (any kind) Strained chicken noodle soup Hard Candy                             4.  In the morning, mix first dose of MoviPrep solution:    Empty 1 Pouch A and 1 Pouch B into the disposable container    Add lukewarm drinking water to the top line of the container. Mix to dissolve    Refrigerate (mixed solution should be used within 24 hrs)  5.  Begin drinking the prep at 5:00 p.m. The MoviPrep container is divided by 4 marks.   Every 15 minutes drink the solution down to the next mark (approximately 8 oz) until the full liter is complete.   6.  Follow completed prep with 16 oz of clear liquid of your choice (Nothing red or purple).  Continue to drink  clear liquids until bedtime.  7.  Before going to bed, mix second dose of MoviPrep solution:    Empty 1 Pouch A and 1 Pouch B into the disposable container    Add lukewarm drinking water to the top line of the container. Mix to dissolve    Refrigerate  THE DAY OF YOUR PROCEDURE      DATE : 07-21-09  DAY: Monday  Beginning at 9:30 AM (5 hours before procedure):         1. Every 15 minutes, drink the solution down to the next mark (approx 8 oz) until the full liter is complete.  2. Follow completed prep with 16 oz. of clear liquid of your choice.    3. You may drink clear liquids until 12:30 PM (2 HOURS BEFORE PROCEDURE).   MEDICATION INSTRUCTIONS  Unless otherwise instructed, you should take regular prescription medications with a small sip of water   as early as possible the morning of your procedure.        OTHER INSTRUCTIONS  You will need a responsible adult at least 74  years of age to accompany you and drive you home.   This person must remain in the waiting room during your procedure.  Wear loose fitting clothing that is easily removed.  Leave jewelry and other valuables at home.  However, you may wish to bring a book to read or  an iPod/MP3 player to listen to music as you wait for your procedure to start.  Remove all body piercing jewelry and leave at home.  Total time from sign-in until discharge is approximately 2-3 hours.  You should go home directly after your procedure and rest.  You can resume normal activities the  day after your procedure.  The day of your procedure you should not:   Drive   Make legal decisions   Operate machinery   Drink alcohol   Return to work  You will receive specific instructions about eating, activities and medications before you leave.    The above instructions have been reviewed and explained to me by   _______________________    I fully understand and can verbalize these instructions  _____________________________ Date _________

## 2010-07-28 NOTE — Progress Notes (Signed)
Summary: Scheduled F/U visit with Dr. Jarold Motto  Phone Note Call from Patient   Caller: Patient Call For: Jacqueline Mathews CMA Summary of Call: Pt had EGD/COLON with Dr. Jarold Motto on 07-21-09.  The pt was told to call and scheduled a follow up visit in 1 month with Dr. Jarold Motto.  I schedueld this for her on 08-19-09 at 4:00 PM.  Initial call taken by: Joselyn Glassman,  July 22, 2009 11:36 AM

## 2010-07-28 NOTE — Assessment & Plan Note (Signed)
Summary: Low hemoglobin, 7.5, ref by Dr. Amador Cunas   History of Present Illness Visit Type: Initial Consult Primary GI MD: Elie Goody MD [facility] Primary Provider: Eleonore Chiquito MD Requesting Provider: Eleonore Chiquito MD Chief Complaint: low hemoglobin  7.5 History of Present Illness:   76 YO FEMALE NEW TO GI TODAY, REFFERRED BY DR Vernell Barrier TO DR Jarold Motto. PT WITH NEW DX OF MARKED MICROCYTIC ANEMIA AND HEME POSITIVE STOOL. SHE HAD GONE IN FOR CHECK-UP, SICK WITH URI OVER CHRISTMAS AND JUST STARTING TO FEEL BETTER. SHE HAS NO HX OF GI PROBLEMS OR ANEMIA. NO C/O ABDOMINAL PAIN,NO KNOWN MELENA OR HEMATOCHEZIA. DENIES WEAKNESS, DIZZINESS ETC. SHE HAS A CHRONIC BLADDER OUTLET DYSFUNCTION, AND PREVIOUSLY NOTED VERY LARGE (11CM) LEFT RENAL PELVIS. FAMILY HX IS POSITIVE FOR COLON CANCER IN HER MOTHER. HGB 7.4/HCT25.3, MCV66.4, LFTS WNL 07/15/09.   GI Review of Systems      Denies abdominal pain, acid reflux, belching, bloating, chest pain, dysphagia with liquids, dysphagia with solids, heartburn, loss of appetite, nausea, vomiting, vomiting blood, and  weight loss.      Reports heme positive stool.     Denies anal fissure, black tarry stools, change in bowel habit, constipation, diarrhea, diverticulosis, fecal incontinence, hemorrhoids, irritable bowel syndrome, jaundice, light color stool, liver problems, rectal bleeding, and  rectal pain.   Current Medications (verified): 1)  Prilosec Otc 20 Mg Tbec (Omeprazole Magnesium) .Marland Kitchen.. 1 Once Daily 2)  Hydrocodone-Homatropine 5-1.5 Mg/49ml Syrp (Hydrocodone-Homatropine) .... One Tsp Every 6 Hours For Cough  Allergies (verified): 1)  ! Bactrim (Sulfamethoxazole-Trimethoprim) 2)  ! Bicillin L-A (Penicillin G Benzathine) 3)  ! Biaxin (Clarithromycin)  Past History:  Past Medical History: Menopausal syndrome GERD Bilateral hydronephrosis microcytic anemia/NEW 1/11  Past Surgical History: Reviewed history from 07/17/2009 and no changes  required. Tonsillectomy G1P1A0 no prior colonoscopies-declines  Family History: Reviewed history from 07/15/2009 and no changes required. followed is 40, coronary artery disease mother died age 73, colon cancer one brother two sisters  Social History: Reviewed history from 07/15/2009 and no changes required. Never Smoked one child, one grandchild  Review of Systems  The patient denies allergy/sinus, anemia, anxiety-new, arthritis/joint pain, back pain, blood in urine, breast changes/lumps, change in vision, confusion, cough, coughing up blood, depression-new, fainting, fatigue, fever, headaches-new, hearing problems, heart murmur, heart rhythm changes, itching, menstrual pain, muscle pains/cramps, night sweats, nosebleeds, pregnancy symptoms, shortness of breath, skin rash, sleeping problems, sore throat, swelling of feet/legs, swollen lymph glands, thirst - excessive , urination - excessive , urination changes/pain, urine leakage, vision changes, and voice change.         ROS OTHERWISE AS IN HPI  Vital Signs:  Patient profile:   76 year old female Height:      64 inches Weight:      112 pounds BMI:     19.29 Pulse rate:   82 / minute Pulse rhythm:   regular BP sitting:   166 / 82  (right arm)  Vitals Entered By: Chales Abrahams CMA Duncan Dull) (July 18, 2009 2:23 PM)  Physical Exam  General:  Well developed, well nourished, no acute distress.,SMALL ,THIN Head:  Normocephalic and atraumatic. Eyes:  PERRLA, no icterus. Lungs:  Clear throughout to auscultation. Heart:  Regular rate and rhythm; no murmurs, rubs,  or bruits. Abdomen:  SOFT,ROUNDED MASS EFFECT IN LOWER ABDOMEN EXTENDING ABOVE THE UMBILICUS,NONTENDER, NO OTHER PALP MASS OR HSM. PT EMPTIED HER BLADDER AND WAS RE-EXAMINED,A BIT SOFTER BUT MASS EFFECT STILL PRESENT. Rectal:  NOT REPEATED, HEME  POSITIVE  ON EXAM EARLIER THIS WEEK. Extremities:  No clubbing, cyanosis, edema or deformities noted. Neurologic:  Alert and   oriented x4;  grossly normal neurologically. Psych:  Alert and cooperative. Normal mood and affect.anxious.    Impression & Recommendations:  Problem # 1:  ANEMIA-IRON DEFICIENCY (ICD-280.9) Assessment New 76 YO FEMALE WITH MARKED MICROCYTIC ANEMIA,HEMOCULT POSITIVE STOOL,AND FAMILY HX OF COLON CANCER. R/O COLONIC NEOPLASM,R/O UPPER TRACT LESION. PT HAS A MASS IN HER LOWER ABDOMEN WHICH IS PROBABLY HER BLADDER,VS A HUGELY DILATED RENAL PELVIS CBC, FE STUDIES TODAY SCHEDULE FOR COLONOSCOPY AND POSSIBLE EGD WITH DR Jarold Motto ON [facility] 1/24. PROCEDURES DISCUSSED IN DETAIL WITH PT, INCLUDING RISKS, BENEFITS AND ALTERNATIVES AND SHE IS ANXIOUS BUT AGREEABLE.  Problem # 2:  FAMILY HX COLON CANCER (ICD-V16.0) Assessment: Comment Only AS ABOVE  Problem # 3:  HYDRONEPHROSIS, BILATERAL (ICD-591) Assessment: Comment Only  Problem # 4:  GERD (ICD-530.81) Assessment: Comment Only ASYMPTOMATIC ON PRILOSEC.  Other Orders: TLB-IBC Pnl (Iron/FE;Transferrin) (83550-IBC) TLB-Iron, (Fe) Total (83540-FE) TLB-CBC Platelet - w/Differential (85025-CBCD) Colon/Endo (Colon/Endo)  Patient Instructions: 1)  Please go to the lab, basement level. 2)  We have scheduled the Colonoscopy/ possible Endoscopy with Dr. Sheryn Bison for 07-21-09.  3)  Colonoscopy/Endoscopy and conscous sedation brochure provided. 4)  We [facility] 5)  Copy sent to : Eleonore Chiquito, MD 6)  The medication list was reviewed and reconciled.  All changed / newly prescribed medications were explained.  A complete medication list was provided to the patient / caregiver.  Prescriptions: MOVIPREP 100 GM  SOLR (PEG-KCL-NACL-NASULF-NA ASC-C) As per prep instructions.  #1 x 0   Entered by:   Lowry Ram NCMA   Authorized by:   Sammuel Cooper PA-c   Signed by:   Lowry Ram NCMA on 07/18/2009   Method used:   [facility]  [address] [id number]       [address]       [place], [place]  [address]       Ph: [phone] or [phone]       Fax: [phone]   RxID:   [id number]

## 2010-07-28 NOTE — Medication Information (Signed)
Summary: Approved/medco  Approved/medco   Imported By: Lester  08/29/2009 09:44:58  _____________________________________________________________________  External Attachment:    Type:   Image     Comment:   External Document

## 2010-07-28 NOTE — Progress Notes (Signed)
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 to extend disablity benifits . I was unable to assist other than to explain that if pt is taking pain med as rx'd - side effects may inpair judgement and actions. Pt is to return to office the end of april unless having problems. [employer] explained that if pt returns to work - they will need a letter that will release her with no restrictions, or list limitations. I will review with Dr. Amador Cunas .  [phone] Initial call taken by: Duard Brady LPN,  September 25, 2009 3:52 PM  Follow-up for Phone Call        Will it be ok for me to send letter? what restrictions if any would need to be listed? I will only do this if requested by pt and her work .KIK Follow-up by: Duard Brady LPN,  September 26, 2009 11:58 AM  Additional Follow-up for Phone Call Additional follow up Details #1::        will ressess next rov Additional Follow-up by: Gordy Savers  MD,  September 26, 2009 12:50 PM

## 2010-07-28 NOTE — Progress Notes (Signed)
Summary: daughter requests call    Caller: daughter, Truett Perna [phone] Call For: kwia Summary of Call: Could you call me & let me know what's going on with her?  She told her blood count low, wants to put her in hospital, bleeding internally.  Knows if I tell her I'm calling she'll say no.  Sent me a couple texts & she is working today.   Initial call taken by: Rudy Jew, RN,  July 18, 2009 3:02 PM    called and discussed

## 2010-07-28 NOTE — Progress Notes (Signed)
Summary: papers DMV? - done and faxed  Phone Note Call from Patient   Caller: vm Call For: kim Summary of Call: Re medical papers to be filled out & mailed to [facility].  They have not received & sent letter of license revocation.   Initial call taken by: Rudy Jew, RN,  September 11, 2009 8:59 AM  Follow-up for Phone Call        I see where there was an office visit 07/15/2009 that mentions DMV ppwk - I dont not see any ppwk that has been scanned into chart. will ask Dr. Amador Cunas to advise.  Follow-up by: Duard Brady LPN,  September 11, 2009 11:52 AM  Additional Follow-up for Phone Call Additional follow up Details #1::        will get out today Additional Follow-up by: Gordy Savers  MD,  September 11, 2009 12:37 PM    Additional Follow-up for Phone Call Additional follow up Details #2::    spoke with pt - Dr. Ashley Royalty has done his letter, I will see that our letter is faxed today.  KIK Follow-up by: Duard Brady LPN,  September 11, 2009 4:01 PM

## 2010-07-28 NOTE — Progress Notes (Signed)
Summary: papers  Phone Note Call from Patient Call back at Home Phone [phone]   Caller: Patient Call For: Gordy Savers  MD Summary of Call: pls call when papers are ready Initial call taken by: Raechel Ache, RN,  September 05, 2009 9:41 AM  Follow-up for Phone Call        papers with Dr. Amador Cunas to complete. Will call when ready.  Follow-up by: Duard Brady LPN,  September 05, 2009 9:56 AM  Additional Follow-up for Phone Call Additional follow up Details #1::        done Additional Follow-up by: Gordy Savers  MD,  September 05, 2009 1:00 PM    Additional Follow-up for Phone Call Additional follow up Details #2::    papers given to cindy to notify pt to pick up. KIK Follow-up by: Duard Brady LPN,  September 05, 2009 1:18 PM

## 2010-07-28 NOTE — Letter (Signed)
Summary: Generic Letter  [facility]  [address] [place], [place] [address]   Phone: [phone]  Fax: [phone]    10/21/2009  Jacqueline Mathews [address] [place], [place]  [address]  Dear Milford Cage:   [name] is a patient followed in the internal medicine practice and was treated for herpes zoster involving the chest wall region in February of this year.  Unfortunately, she has developed severe postherpetic neuralgia, and remains quite symptomatic.  She continues to have significant chest wall pain, weakness, poor sleep habits and is unable to work at this time due to this complication.  This condition may last weeks to months, and it is unclear when she will be able to return to full-time employment.     Sincerely,   Eleonore Chiquito  MD

## 2010-07-28 NOTE — Letter (Signed)
Summary: Out of Work  Adult nurse at [employer]  [address]   [place], [place] [address]   Phone: [phone]  Fax: [phone]    September 01, 2009   Employee:  Neill Loft    To Whom It May Concern:   For Medical reasons, please excuse the above named employee from work for the following dates:  Start:   08-25-2009  End:   09-15-2009 if improved  If you need additional information, please feel free to contact our office.         Sincerely,    Gordy Savers  MD

## 2010-07-28 NOTE — Assessment & Plan Note (Signed)
Summary: PAIN FROM SHINGLES/PER KIM/CJR   Vital Signs:  Patient profile:   76 year old female Weight:      112 pounds Temp:     97.4 degrees F oral BP sitting:   140 / 80  (right arm) Cuff size:   regular  Vitals Entered By: Duard Brady LPN (October 07, 2009 11:02 AM) CC: c/o still with pain r/t shingles - can't go to work - medication making her dizzy Is Patient Diabetic? No   Primary Care Provider:  Eleonore Chiquito MD  CC:  c/o still with pain r/t shingles - can't go to work - medication making her dizzy.  History of Present Illness: 76 year old patient who is seen today for follow-up of her severe postherpetic neuralgia.  She is still quite uncomfortable.  She failed an attempt to titrate her Neurontin to 300 t.i.d..  This caused much too much sedation, but she did tolerate 100 mg t.i.d..  Will down titrate and consider a slow up titration weekly.  She will need another note to be out of work for at least two more weeks  Allergies: 1)  ! Bactrim (Sulfamethoxazole-Trimethoprim) 2)  ! Bicillin L-A (Penicillin G Benzathine) 3)  ! Biaxin (Clarithromycin)  Physical Exam  General:  quite uncomfortable underweight appearing.  underweight appearing.   Skin:  resolving herpetic eruption left posterolateral chest wall   Impression & Recommendations:  Problem # 1:  SHINGLES (ICD-053.9)  Problem # 2:  POSTHERPETIC NEURALGIA (ICD-053.19)  Complete Medication List: 1)  Prilosec Otc 20 Mg Tbec (Omeprazole magnesium) .Marland Kitchen.. 1 once daily 2)  Hydrocodone-homatropine 5-1.5 Mg/47ml Syrp (Hydrocodone-homatropine) .... One tsp every 6 hours for cough 3)  Nu-iron 150 Mg Caps (Polysaccharide iron complex) .... Take 1 tab twice daily 4)  Dexilant 60 Mg Cpdr (Dexlansoprazole) .Marland Kitchen.. 1 by mouth qd 5)  Valtrex 1 Gm Tabs (Valacyclovir hcl) .... One tid 6)  Hydrocodone-acetaminophen 5-500 Mg Tabs (Hydrocodone-acetaminophen) .... One or two every 6 hours for pain 7)  Gabapentin 300 Mg Caps  (Gabapentin) .... One by mouth three times a day 8)  Lidoderm 5 % Ptch (Lidocaine) .... Apply every 12-24 hrs 9)  Gabapentin 100 Mg Caps (Gabapentin) .... One capsule 3 times daily  Patient Instructions: 1)  Please schedule a follow-up appointment in 3 months. Prescriptions: GABAPENTIN 100 MG CAPS (GABAPENTIN) one capsule 3 times daily  #100 x 6   Entered and Authorized by:   Gordy Savers  MD   Signed by:   Gordy Savers  MD on 10/07/2009   Method used:   Print then Give to Patient   RxID:   [id number] GABAPENTIN 100 MG CAPS (GABAPENTIN) one capsule 3 times daily  #100 x 6   Entered and Authorized by:   Gordy Savers  MD   Signed by:   Gordy Savers  MD on 10/07/2009   Method used:   [id number]       [address]       [place], [place]  [address]       Ph: [phone] or [phone]       Fax: [phone]   RxID:   [id number]

## 2010-07-28 NOTE — Assessment & Plan Note (Signed)
Summary: 1 month rov/njr/PT RESCD//CCM   Vital Signs:  Mathews profile:   76 year old female Weight:      110 pounds Temp:     98.8 degrees F oral BP sitting:   180 / 92  (left arm) Cuff size:   regular  Vitals Entered By: Duard Brady LPN (September 15, 2009 3:14 PM) CC: c/o of shingles pain worse - upper abd and under(L) breast Is Mathews Diabetic? No   Primary Care Provider:  Eleonore Chiquito MD  CC:  c/o of shingles pain worse - upper abd and under(L) breast.  History of Present Illness: Jacqueline Mathews who is seen today for follow-up of  shingles.  She is having considerable pain involving her left chest wall region.  She has been using analgesics sparingly and is on Neurontin  100 mg t.i.d. She has completed treatment with Valtrex.  Is also being followed for GI bleeding and anemia.  that has stabilized.  Allergies: 1)  ! Bactrim (Sulfamethoxazole-Trimethoprim) 2)  ! Bicillin L-A (Penicillin G Benzathine) 3)  ! Biaxin (Clarithromycin)  Past History:  Past Medical History: Reviewed history from 08/25/2009 and no changes required. Menopausal syndrome GERD Bilateral hydronephrosis microcytic anemia/NEW 1/11 shingles February 2011  Review of Systems       The Mathews complains of chest pain.  The Mathews denies anorexia, fever, weight loss, weight gain, vision loss, decreased hearing, hoarseness, syncope, dyspnea on exertion, peripheral edema, prolonged cough, headaches, hemoptysis, abdominal pain, melena, hematochezia, severe indigestion/heartburn, hematuria, incontinence, genital sores, muscle weakness, suspicious skin lesions, transient blindness, difficulty walking, depression, unusual weight change, abnormal bleeding, enlarged lymph nodes, angioedema, and breast masses.    Physical Exam  General:  elderly uncomfortable.  No acute distress Chest Wall:  resolving herpetic rash, most marked over the left posterolateral chest wall, but extending almost to the  midline anteriorly   Impression & Recommendations:  Problem # 1:  SHINGLES (ICD-053.9)  Problem # 2:  ANEMIA-IRON DEFICIENCY (ICD-280.9)  Her updated medication list for this problem includes:    Nu-iron 150 Mg Caps (Polysaccharide iron complex) .Marland Kitchen... Take 1 tab twice daily  Her updated medication list for this problem includes:    Nu-iron 150 Mg Caps (Polysaccharide iron complex) .Marland Kitchen... Take 1 tab twice daily  Complete Medication List: 1)  Prilosec Otc 20 Mg Tbec (Omeprazole magnesium) .Marland Kitchen.. 1 once daily 2)  Hydrocodone-homatropine 5-1.5 Mg/66ml Syrp (Hydrocodone-homatropine) .... One tsp every 6 hours for cough 3)  Nu-iron 150 Mg Caps (Polysaccharide iron complex) .... Take 1 tab twice daily 4)  Dexilant 60 Mg Cpdr (Dexlansoprazole) .Marland Kitchen.. 1 by mouth qd 5)  Valtrex 1 Gm Tabs (Valacyclovir hcl) .... One tid 6)  Hydrocodone-acetaminophen 5-500 Mg Tabs (Hydrocodone-acetaminophen) .... One or two every 6 hours for pain 7)  Gabapentin 100 Mg Caps (Gabapentin) .... Two  tablet 3 times daily 8)  Lidoderm 5 % Ptch (Lidocaine) .... Apply every 12-24 hrs  Mathews Instructions: 1)  Please schedule a follow-up appointment in 1 month. Prescriptions: HYDROCODONE-ACETAMINOPHEN 5-500 MG TABS (HYDROCODONE-ACETAMINOPHEN) one or two every 6 hours for pain  #50 x 2   Entered and Authorized by:   Gordy Savers  MD   Signed by:   Gordy Savers  MD on 09/15/2009   Method used:   Print then Give to Mathews   RxID:   [id number] GABAPENTIN 100 MG CAPS (GABAPENTIN) two  tablet 3 times daily  #100 x 4   Entered and Authorized by:   Janett Labella  Amador Cunas  MD   Signed by:   Gordy Savers  MD on 09/15/2009   Method used:   Print then Give to Mathews   RxID:   [id number] LIDODERM 5 % PTCH (LIDOCAINE) apply every 12-24 hrs  #20 x 4   Entered and Authorized by:   Gordy Savers  MD   Signed by:   Gordy Savers  MD on 09/15/2009   Method used:   Print then Give to Mathews    RxID:   [id number]

## 2010-07-28 NOTE — Assessment & Plan Note (Signed)
Summary: shingles/cdw   Vital Signs:  Patient profile:   76 year old female Weight:      110 pounds Temp:     98.6 degrees F oral BP sitting:   146 / 90  (right arm) Cuff size:   regular  Vitals Entered By: Duard Brady LPN (September 02, 5782 12:46 PM) CC: c/o shingle pain increasing  FMLA ppwk  Is Patient Diabetic? No   Primary Care Provider:  Eleonore Chiquito MD  CC:  c/o shingle pain increasing  FMLA ppwk .  History of Present Illness: 76 year-old patient who was seen one week ago and for shingles involving the left chest wall area.  She is on day 7 of 10, Valtrex, and has been taken analgesics for pain.  She continues to have considerable pain and is quite miserable.  She is scheduled to return to work tomorrow and is also scheduled for parenteral iron.  She is having difficulty sleeping due to the pain.  Allergies: 1)  ! Bactrim (Sulfamethoxazole-Trimethoprim) 2)  ! Bicillin L-A (Penicillin G Benzathine) 3)  ! Biaxin (Clarithromycin)  Past History:  Past Medical History: Reviewed history from 08/25/2009 and no changes required. Menopausal syndrome GERD Bilateral hydronephrosis microcytic anemia/NEW 1/11 shingles February 2011  Review of Systems       The patient complains of suspicious skin lesions.  The patient denies anorexia, fever, weight loss, weight gain, vision loss, decreased hearing, hoarseness, chest pain, syncope, dyspnea on exertion, peripheral edema, prolonged cough, headaches, hemoptysis, abdominal pain, melena, hematochezia, severe indigestion/heartburn, hematuria, incontinence, genital sores, muscle weakness, transient blindness, difficulty walking, depression, unusual weight change, abnormal bleeding, enlarged lymph nodes, angioedema, and breast masses.    Physical Exam  General:  Well-developed,well-nourished; quite uncomfortable, but in no acute distress.  Blood pressure normal Skin:  herpetic rash in the left lateral chest wall area.  This is  most marked posteriorly that extends almost to the mid chest area anteriorly   Impression & Recommendations:  Problem # 1:  SHINGLES (ICD-053.9)  Problem # 2:  ANEMIA-IRON DEFICIENCY (ICD-280.9)  Her updated medication list for this problem includes:    Nu-iron 150 Mg Caps (Polysaccharide iron complex) .Marland Kitchen... Take 1 tab twice daily  Her updated medication list for this problem includes:    Nu-iron 150 Mg Caps (Polysaccharide iron complex) .Marland Kitchen... Take 1 tab twice daily  Complete Medication List: 1)  Prilosec Otc 20 Mg Tbec (Omeprazole magnesium) .Marland Kitchen.. 1 once daily 2)  Hydrocodone-homatropine 5-1.5 Mg/7ml Syrp (Hydrocodone-homatropine) .... One tsp every 6 hours for cough 3)  Nu-iron 150 Mg Caps (Polysaccharide iron complex) .... Take 1 tab twice daily 4)  Dexilant 60 Mg Cpdr (Dexlansoprazole) .Marland Kitchen.. 1 by mouth qd 5)  Valtrex 1 Gm Tabs (Valacyclovir hcl) .... One tid 6)  Hydrocodone-acetaminophen 5-500 Mg Tabs (Hydrocodone-acetaminophen) .... One or two every 6 hours for pain 7)  Gabapentin 100 Mg Caps (Gabapentin) .... One tablet 3 times daily  Patient Instructions: 1)  Please schedule a follow-up appointment in 2 weeks. 2)  Limit your Sodium (Salt). Prescriptions: GABAPENTIN 100 MG CAPS (GABAPENTIN) one tablet 3 times daily  #50 x 4   Entered and Authorized by:   Gordy Savers  MD   Signed by:   Gordy Savers  MD on 09/01/2009   Method used:   Print then Give to Patient   RxID:   [id number] GABAPENTIN 100 MG CAPS (GABAPENTIN) one tablet 3 times daily  #50 x 0   Entered and Authorized  by:   Gordy Savers  MD   Signed by:   Gordy Savers  MD on 09/01/2009   Method used:   [facility]  [address] [id number]       [address]       [place], [place]  [address]       Ph: [phone] or [phone]       Fax: [phone]   RxID:   [id number]

## 2010-07-28 NOTE — Procedures (Signed)
Summary: Upper Endoscopy  Patient: Jacqueline Mathews Note: All result statuses are Final unless otherwise noted.  Tests: (1) Upper Endoscopy (EGD)   EGD Upper Endoscopy       DONE     [facility]     [address]     [place], [place]  [address]           ENDOSCOPY PROCEDURE REPORT           PATIENT:  Jacqueline Mathews, Jacqueline Mathews  MR#:  [id number]     BIRTHDATE:  [birth year], 74 yrs. old  GENDER:  female           ENDOSCOPIST:  Vania Rea. Jarold Motto, MD, [facility]     Referred by:           PROCEDURE DATE:  07/21/2009     PROCEDURE:  EGD with biopsy     ASA CLASS:  Class II     INDICATIONS:  FOBT + stool, iron deficiency anemia ON CHRONIC     PRILOSEC RX.           MEDICATIONS:   There was residual sedation effect present from     prior procedure., glycopyrrolate (Robinal) 0.2 mg IV     TOPICAL ANESTHETIC:  Exactacain Spray           DESCRIPTION OF PROCEDURE:   After the risks benefits and     alternatives of the procedure were thoroughly explained, informed     consent was obtained.  The LB [id number] endoscope was     introduced through the mouth and advanced to the second portion of     the duodenum, without limitations.  The instrument was slowly     withdrawn as the mucosa was fully examined.     <<PROCEDUREIMAGES>>           A hiatal hernia was found. LARGE 8CM. HIATIAL HERNIA WITH CAMERON     EROSIONS IN HERNIA SAC.  Multiple erosions were found in the     distal esophagus.  stenosis.  The stomach was entered and closely     examined. The antrum, angularis, and lesser curvature were well     visualized, including a retroflexed view of the cardia and fundus.     The stomach wall was normally distensable. The scope passed easily     through the pylorus into the duodenum.  Folds were noted.     FLATTENED DUODENAL FOLDS BIOPSIED.    Retroflexed views revealed a     hiatal hernia.    The scope was then withdrawn from the patient     and the procedure completed.        COMPLICATIONS:  None           ENDOSCOPIC IMPRESSION:     1) Hiatal hernia     2) Erosions, multiple in the distal esophagus     3) Normal stomach     ESOPHAGEAL STRICTURE DILATED WITH THE ENDOSCOPE.R/O CELIAC     DISEASE.CHRONIC GERD AND LARGE HH.     RECOMMENDATIONS:     1) anti-reflux regimen to be follow     2) follow-up: office 1 month(s)     DEXILANT 60 MG/DAY AND PO IRON RX.           REPEAT EXAM:  No           ______________________________     Vania Rea. Jarold Motto, MD, [facility]           CC:  n.     eSIGNED:   Vania Rea. Akaash Vandewater at 07/21/2009 03:21 PM           Edgardo Roys, [id number]  Note: An exclamation mark (!) indicates a result that was not dispersed into the flowsheet. Document Creation Date: 07/21/2009 3:21 PM _______________________________________________________________________  (1) Order result status: Final Collection or observation date-time: 07/21/2009 15:09 Requested date-time:  Receipt date-time:  Reported date-time:  Referring Physician:   Ordering Physician: Sheryn Bison [phone]) Specimen Source:  Source: Launa Grill Order Number: [id number] Lab site:   Appended Document: Upper Endoscopy noted

## 2010-07-28 NOTE — Letter (Signed)
Summary: Out of Work  Adult nurse at [employer]  [address]   [place], [place] [address]   Phone: [phone]  Fax: [phone]    October 07, 2009   Employee:  Neill Loft    To Whom It May Concern:   For Medical reasons, please excuse the above named employee from work for the following dates:  Start:   10-07-2009  End:   10-21-2009  If you need additional information, please feel free to contact our office.         Sincerely,    Gordy Savers  MD

## 2010-07-28 NOTE — Letter (Signed)
Summary: Out of Work  Adult nurse at [employer]  [address]   [place], [place] [address]   Phone: [phone]  Fax: [phone]    August 25, 2009   Employee:  Neill Loft    To Whom It May Concern:   For Medical reasons, please excuse the above named employee from work for the following dates:  Start:   08-25-2009  End:    09-02-2008  If you need additional information, please feel free to contact our office.         Sincerely,    Gordy Savers  MD

## 2010-07-28 NOTE — Letter (Signed)
Summary: Out of Work  Adult nurse at [employer]  [address]   [place], [place] [address]   Phone: [phone]  Fax: [phone]    September 29, 2009   Employee:  NEERA TENG    To Whom It May Concern:   For Medical reasons, please excuse the above named employee from work for the following dates:  Start:   09-29-09  End:   10-07-09  If you need additional information, please feel free to contact our office.         Sincerely,    Evelena Peat MD

## 2010-07-28 NOTE — Procedures (Signed)
Summary: Colonoscopy  Patient: Jacqueline Mathews Note: All result statuses are Final unless otherwise noted.  Tests: (1) Colonoscopy (COL)   COL Colonoscopy           DONE     [facility]     [address]     [place], [place]  [address]           COLONOSCOPY PROCEDURE REPORT           PATIENT:  [name], [name]  MR#:  [id number]     BIRTHDATE:  February 19, 1935, 74 yrs. old  GENDER:  female           ENDOSCOPIST:  Vania Rea. Jarold Motto, MD, [facility]     Referred by:           PROCEDURE DATE:  07/21/2009     PROCEDURE:  Colonoscopy, Diagnostic     ASA CLASS:  Class II     INDICATIONS:  Blood in stool, FOBT positive stool, Iron Deficiency     Anemia, rectal bleeding           MEDICATIONS:   Fentanyl 50 mcg IV, Versed 7 mg IV           DESCRIPTION OF PROCEDURE:   After the risks benefits and     alternatives of the procedure were thoroughly explained, informed     consent was obtained.  Digital rectal exam was performed and     revealed no abnormalities.   The LB [id number] endoscope     was introduced through the anus and advanced to the cecum, which     was identified by both the appendix and ileocecal valve, without     limitations.  The quality of the prep was excellent, using     MoviPrep.  The instrument was then slowly withdrawn as the colon     was fully examined.     <<PROCEDUREIMAGES>>           FINDINGS:  Scattered diverticula were found sigmoid to descending     hemorrhoids were found.  No polyps or cancers were seen.     Retroflexed views in the rectum revealed hemorrhoids.    The scope     was then withdrawn from the patient and the procedure completed.           COMPLICATIONS:  None           ENDOSCOPIC IMPRESSION:     1) Diverticula, scattered in the sigmoid to descending     2) No polyps or cancers     3) Hemorrhoids     RECOMMENDATIONS:     1) high fiber diet     2) Upper endoscopy will be scheduled           REPEAT EXAM:  No        ______________________________     Vania Rea. Jarold Motto, MD, [facility]           CC:  Gordy Savers, MD           n.     Rosalie Doctor:   Vania Rea. Patterson at 07/21/2009 03:03 PM           Edgardo Roys, [id number]  Note: An exclamation mark (!) indicates a result that was not dispersed into the flowsheet. Document Creation Date: 07/21/2009 3:03 PM _______________________________________________________________________  (1) Order result status: Final Collection or observation date-time: 07/21/2009 14:57 Requested date-time:  Receipt date-time:  Reported date-time:  Referring Physician:  Ordering Physician: Sheryn Bison [phone]) Specimen Source:  Source: Launa Grill Order Number: [id number] Lab site:   Appended Document: Colonoscopy     Procedures Next Due Date:    Colonoscopy: 07/2014

## 2010-07-28 NOTE — Letter (Signed)
Summary: Patient Notice-Endo Biopsy Results  [facility]  [address] [place], [place] [address]   Phone: [phone]  Fax: [phone]        July 24, 2009 MRN: [id number]    Jacqueline Mathews [address] [place], [place]  [address]    Dear [name],  I am pleased to inform you that the biopsies taken during your recent endoscopic examination did not show any evidence of cancer upon pathologic examination.  Additional information/recommendations:  __No further action is needed at this time.  Please follow-up with      your primary care physician for your other healthcare needs.  __ Please call [phone] to schedule a return visit to review      your condition.  xx__ Continue with the treatment plan as outlined on the day of your      exam.  __ You should have a repeat endoscopic examination for this problem              in _ months/years.   Please call us if you are having persistent problems or have questions about your condition that have not been fully answered at this time.  Sincerely,  Mardella Layman MD [facility]  This letter has been electronically signed by your physician.  Appended Document: Patient Notice-Endo Biopsy Results Letter mailed 1.28.11

## 2010-07-28 NOTE — Assessment & Plan Note (Signed)
Summary: per PK anemia   Vital Signs:  Patient profile:   76 year old female O2 Sat:      97 % on Room air Temp:     98.8 degrees F oral Pulse rate:   100 / minute Pulse rhythm:   regular BP sitting:   160 / 86  (left arm) Cuff size:   regular  Vitals Entered By: Raechel Ache, RN (July 17, 2009 4:02 PM)  O2 Flow:  Room air CC: F/u anemia.   CC:  F/u anemia.Marland Kitchen  History of Present Illness: 76 year old patient who was seen a couple days ago for a DMV evaluation after a lengthy absence.  Laboratory studies were obtained and revealed a mildly severe microcytic anemia.  The patient returns today for follow-up.  The patient denies any abdominal pain.  She feels that she may have lost some weight over the past year, but it is unclear.  She denies any change in her bowel habits or melena.  She denies any urinary difficulties, incontinence.  Family history is positive for colon cancer-her mother died of complications of colon cancer- but the patient has in the past refused colonoscopies. the patient has been at work and denies any functional limitations.  Denies any weakness or any presyncopal symptoms  Allergies: 1)  ! Bactrim (Sulfamethoxazole-Trimethoprim) 2)  ! Bicillin L-A (Penicillin G Benzathine) 3)  ! Biaxin (Clarithromycin)  Past History:  Past Medical History: Menopausal syndrome GERD Bilateral hydronephrosis microcytic anemia  Past Surgical History: Tonsillectomy G1P1A0 no prior colonoscopies-declines  Family History: Reviewed history from 07/15/2009 and no changes required. followed is 58, coronary artery disease mother died age 92, colon cancer one brother two sisters  Social History: Reviewed history from 07/15/2009 and no changes required. Never Smoked one child, one grandchild  Review of Systems       The patient complains of anorexia, weight loss, and prolonged cough.  The patient denies fever, weight gain, vision loss, decreased hearing, hoarseness,  chest pain, syncope, dyspnea on exertion, peripheral edema, headaches, hemoptysis, abdominal pain, melena, hematochezia, severe indigestion/heartburn, hematuria, incontinence, genital sores, muscle weakness, suspicious skin lesions, transient blindness, difficulty walking, depression, unusual weight change, abnormal bleeding, enlarged lymph nodes, angioedema, and breast masses.    Physical Exam  General:  Well-developed,well-nourished,in no acute distress; alert,appropriate and cooperative throughout examination; slightly pale, but not strikingly so; blood pressure 160/90 without orthostatic change Head:  Normocephalic and atraumatic without obvious abnormalities. No apparent alopecia or balding. Eyes:  mild arcus Mouth:  Oral mucosa and oropharynx without lesions or exudates.  Teeth in good repair. Neck:  No deformities, masses, or tenderness noted. Chest Wall:  scoliosis.   Breasts:  No mass, nodules, thickening, tenderness, bulging, retraction, inflamation, nipple discharge or skin changes noted.   Lungs:  diminished breath sounds left base Heart:  Normal rate and regular rhythm. S1 and S2 normal without gallop, murmur, click, rub or other extra sounds. Abdomen:  distended bladder to the level of the umbilicus Rectal:  normal-appearing hematest positive stool Msk:  osteo-arthritic changes involve the small joints of the hands Extremities:  left ankle edema Psych:  moderately anxious.     Impression & Recommendations:  Problem # 1:  HYDRONEPHROSIS, BILATERAL (ICD-591)  Orders: DRE (A5409) Venipuncture (81191) TLB-CBC Platelet - w/Differential (85025-CBCD) TLB-Ferritin (82728-FER)  Problem # 2:  ANEMIA, SECONDARY TO CHRONIC BLOOD LOSS (ICD-280.0)  Orders: DRE (Y7829) Venipuncture (56213) TLB-CBC Platelet - w/Differential (85025-CBCD) TLB-Ferritin (82728-FER) hospital admission  was encouraged but she vigorously  declines and becomes quite tearful.  The patient has been  instructed to take iron twice daily and will arrange prompt GI evaluation to include colonoscopy.  The patient is aware of the strong possibility of colon cancer.  She has been instructed to report to the ED if she becomes symptomatic  Problem # 3:  HYDRONEPHROSIS, BILATERAL (ICD-591)  Orders: DRE (N0272) Venipuncture (53664) TLB-CBC Platelet - w/Differential (85025-CBCD) TLB-Ferritin (82728-FER) patient apparently has findings worrisome for atonic bladder, but denies any urinary difficulties.  Renal indices have been normal, will further evaluate with an abdominal and pelvic CT, along with colonoscopy.  Will likely need referral back to urology  Complete Medication List: 1)  Prilosec Otc 20 Mg Tbec (Omeprazole magnesium) .Marland Kitchen.. 1 once daily 2)  Hydrocodone-homatropine 5-1.5 Mg/49ml Syrp (Hydrocodone-homatropine) .... One tsp every 6 hours for cough  Patient Instructions: 1)  take iron twice daily 2)  GI follow-up as scheduled 3)  report to the emergency room if you develop any weakness, dizziness 4)  call the office for hospital admission if desired Prescriptions: PRILOSEC OTC 20 MG TBEC (OMEPRAZOLE MAGNESIUM) 1 once daily  #90 x 0   Entered and Authorized by:   Gordy Savers  MD   Signed by:   Gordy Savers  MD on 07/17/2009   Method used:   Electronically to        [facility]  [id number] (retail)       [address]       [place], [place]  [address]       Ph: [phone] or [phone]       Fax: [phone]   RxID:   [id number]

## 2010-10-21 ENCOUNTER — Other Ambulatory Visit: Payer: Self-pay | Admitting: Physician Assistant

## 2010-11-13 NOTE — Op Note (Signed)
NAMELIBORIA, PUTNAM NO.:  [id number]   MEDICAL RECORD NO.:  [id number]          PATIENT TYPE:  AMB   LOCATION:  [facility]                         FACILITY[facility]  [facility]   PHYSICIAN:  Bertram Millard. Dahlstedt, M.D.DATE OF BIRTH:  08-Jul-1934   DATE OF PROCEDURE:  10/23/2004  DATE OF DISCHARGE:                                 OPERATIVE REPORT   PREOPERATIVE DIAGNOSIS:  Left hydronephrosis, probable ureteropelvic  junction obstruction.   POSTOPERATIVE DIAGNOSIS:  Left hydronephrosis, probable ureteropelvic  junction obstruction.   PRINCIPAL PROCEDURE:  Cystoscopy, left retrograde ureteral pyelogram, double  J stent placement, interpretive fluoroscopy.   SURGEON:  Bertram Millard. Dahlstedt, M.D.   ANESTHESIA:  General with LMA.   COMPLICATIONS:  None.   BRIEF HISTORY:  This nice 76 year old lady presents at this time for stent  placement. She originally presented to my office initially on February 28.  At that time she was having left flank pain with nausea and vomiting. She  underwent an evaluation for a palpable mass in the left upper quadrant. She  was found to have bilateral hydronephrosis. The left renal pelvis at that  time was over 11 cm in size, the right was smaller. She had normal renal  function.   At initial presentation, she was found to have infected urine and was given  an extended course of antibiotics.   Evaluation has revealed probable UPJ obstruction in this lady. She had a  Lasix renogram which revealed some delay on the right but significant on the  left.   At this point, I think it is worthwhile to place a double J stent. First,  this will hopefully relieve any obstructive symptoms. Secondly, it will get  the renal function on the left down to baseline and we can see whether we  can was preserve this kidney or not.   The risks and complications of cysto and double J stent placement were  discussed with the patient at length. She understands these and  desires to  proceed.   DESCRIPTION OF PROCEDURE:  The patient was given preoperative IV antibiotics  (Cipro),  taken to the operating room where general anesthetic was  administered. She was placed in dorsal lithotomy position. Genitalia and  perineum were prepped and draped. A 22 French panendoscope was placed in her  bladder which was normal. Urine was sent for culture. Both ureteral orifices  were normal in configuration and location.   A left retrograde pyelogram was performed. This revealed a normal left  ureter. At the UPJ, there was significant beaking, and a dilated renal  pelvis proximal to this. Due to the presence of some bacteria in the urine,  I did not perform a significant retrograde under pressure. I just wanted to  see that the ureter was normal.   After retrograde pyelogram, I placed a guidewire endoscopically using  fluoroscopic guidance through the left ureter and into the renal pelvis.  Over top of this, a 6 French x 26 cm double J stent was placed. Good curls  were seen proximally and distally when the guidewire was removed. At this  point,  the bladder was decompressed, the scope removed, the procedure  terminated.   The patient tolerated the procedure well. She was awakened and extubated.  She was taken to the PACU in stable condition.   She will be sent home on Cipro 250 mg one p.o. b.i.d. for 10 days.  Additionally, I have sent her home on oxybutynin 5 mg one p.o. q.6 h p.r.n.  bladder spasm/frequency (#30).   She will follow-up with me in approximately 2 weeks.      SMD/MEDQ  D:  10/23/2004  T:  10/23/2004  Job:  [id number]   cc:   Gordy Savers, M.D. [facility]

## 2011-05-27 ENCOUNTER — Ambulatory Visit (INDEPENDENT_AMBULATORY_CARE_PROVIDER_SITE_OTHER): Payer: Medicare Other | Admitting: Internal Medicine

## 2011-05-27 DIAGNOSIS — Z23 Encounter for immunization: Secondary | ICD-10-CM

## 2012-05-23 ENCOUNTER — Ambulatory Visit (INDEPENDENT_AMBULATORY_CARE_PROVIDER_SITE_OTHER): Payer: Medicare Other | Admitting: Internal Medicine

## 2012-05-23 DIAGNOSIS — Z23 Encounter for immunization: Secondary | ICD-10-CM

## 2013-05-16 ENCOUNTER — Ambulatory Visit (INDEPENDENT_AMBULATORY_CARE_PROVIDER_SITE_OTHER): Payer: Medicare Other

## 2013-05-16 DIAGNOSIS — Z23 Encounter for immunization: Secondary | ICD-10-CM

## 2014-05-15 ENCOUNTER — Telehealth: Payer: Self-pay | Admitting: Internal Medicine

## 2014-05-15 NOTE — Telephone Encounter (Signed)
ok 

## 2014-05-15 NOTE — Telephone Encounter (Signed)
Pt has not seen you in 12+yrs. Would like to know if you will accept her back?  Pt is medicare.

## 2014-05-17 NOTE — Telephone Encounter (Signed)
Pt has been scheduled.  °

## 2014-08-01 ENCOUNTER — Encounter: Payer: Self-pay | Admitting: Internal Medicine

## 2014-08-01 ENCOUNTER — Ambulatory Visit (INDEPENDENT_AMBULATORY_CARE_PROVIDER_SITE_OTHER): Payer: Medicare Other | Admitting: Internal Medicine

## 2014-08-01 VITALS — BP 138/82 | HR 80 | Temp 97.4°F | Resp 18 | Ht 64.0 in | Wt 112.5 lb

## 2014-08-01 DIAGNOSIS — D509 Iron deficiency anemia, unspecified: Secondary | ICD-10-CM | POA: Diagnosis not present

## 2014-08-01 DIAGNOSIS — E785 Hyperlipidemia, unspecified: Secondary | ICD-10-CM | POA: Diagnosis not present

## 2014-08-01 DIAGNOSIS — N1339 Other hydronephrosis: Secondary | ICD-10-CM | POA: Diagnosis not present

## 2014-08-01 DIAGNOSIS — Z0000 Encounter for general adult medical examination without abnormal findings: Secondary | ICD-10-CM | POA: Diagnosis not present

## 2014-08-01 DIAGNOSIS — Z23 Encounter for immunization: Secondary | ICD-10-CM

## 2014-08-01 LAB — COMPREHENSIVE METABOLIC PANEL
ALBUMIN: 4.3 g/dL (ref 3.5–5.2)
ALK PHOS: 86 U/L (ref 39–117)
ALT: 18 U/L (ref 0–35)
AST: 21 U/L (ref 0–37)
BUN: 15 mg/dL (ref 6–23)
CHLORIDE: 106 meq/L (ref 96–112)
CO2: 27 meq/L (ref 19–32)
Calcium: 10.2 mg/dL (ref 8.4–10.5)
Creatinine, Ser: 0.75 mg/dL (ref 0.40–1.20)
GFR: 79.58 mL/min (ref >60.00)
GLUCOSE: 87 mg/dL (ref 70–99)
Potassium: 6 mEq/L — ABNORMAL HIGH (ref 3.5–5.1)
SODIUM: 140 meq/L (ref 135–145)
TOTAL PROTEIN: 7.4 g/dL (ref 6.0–8.3)
Total Bilirubin: 0.7 mg/dL (ref 0.2–1.2)

## 2014-08-01 LAB — CBC WITH DIFFERENTIAL/PLATELET
BASOS ABS: 0 10*3/uL (ref 0.0–0.1)
Basophils Relative: 0.5 % (ref 0.0–3.0)
EOS ABS: 0.2 10*3/uL (ref 0.0–0.7)
EOS PCT: 2.5 % (ref 0.0–5.0)
HCT: 46.7 % — ABNORMAL HIGH (ref 36.0–46.0)
Hemoglobin: 15.9 g/dL — ABNORMAL HIGH (ref 12.0–15.0)
LYMPHS ABS: 1.5 10*3/uL (ref 0.7–4.0)
Lymphocytes Relative: 18 % (ref 12.0–46.0)
MCHC: 34 g/dL (ref 30.0–36.0)
MCV: 90.3 fl (ref 78.0–100.0)
MONO ABS: 0.6 10*3/uL (ref 0.1–1.0)
Monocytes Relative: 7.7 % (ref 3.0–12.0)
NEUTROS ABS: 5.9 10*3/uL (ref 1.4–7.7)
NEUTROS PCT: 71.3 % (ref 43.0–77.0)
Platelets: 228 10*3/uL (ref 150.0–400.0)
RBC: 5.16 Mil/uL — ABNORMAL HIGH (ref 3.87–5.11)
RDW: 14.9 % (ref 11.5–15.5)
WBC: 8.3 10*3/uL (ref 4.0–10.5)

## 2014-08-01 LAB — LIPID PANEL
CHOL/HDL RATIO: 3
CHOLESTEROL: 244 mg/dL — AB (ref 0–200)
HDL: 77.5 mg/dL (ref >39.00)
LDL CALC: 151 mg/dL — AB (ref 0–99)
NONHDL: 166.5
Triglycerides: 80 mg/dL (ref 0.0–149.0)
VLDL: 16 mg/dL (ref 0.0–40.0)

## 2014-08-01 LAB — TSH: TSH: 1.57 u[IU]/mL (ref 0.35–4.50)

## 2014-08-01 NOTE — Patient Instructions (Signed)
Limit your sodium (Salt) intake   Health Maintenance Adopting a healthy lifestyle and getting preventive care can go a long way to promote health and wellness. Talk with your health care provider about what schedule of regular examinations is right for you. This is a good chance for you to check in with your provider about disease prevention and staying healthy. In between checkups, there are plenty of things you can do on your own. Experts have done a lot of research about which lifestyle changes and preventive measures are most likely to keep you healthy. Ask your health care provider for more information. WEIGHT AND DIET  Eat a healthy diet  Be sure to include plenty of vegetables, fruits, low-fat dairy products, and lean protein.  Do not eat a lot of foods high in solid fats, added sugars, or salt.  Get regular exercise. This is one of the most important things you can do for your health.  Most adults should exercise for at least 150 minutes each week. The exercise should increase your heart rate and make you sweat (moderate-intensity exercise).  Most adults should also do strengthening exercises at least twice a week. This is in addition to the moderate-intensity exercise.  Maintain a healthy weight  Body mass index (BMI) is a measurement that can be used to identify possible weight problems. It estimates body fat based on height and weight. Your health care provider can help determine your BMI and help you achieve or maintain a healthy weight.  For females 32 years of age and older:   A BMI below 18.5 is considered underweight.  A BMI of 18.5 to 24.9 is normal.  A BMI of 25 to 29.9 is considered overweight.  A BMI of 30 and above is considered obese.  Watch levels of cholesterol and blood lipids  You should start having your blood tested for lipids and cholesterol at 78 years of age, then have this test every 5 years.  You may need to have your cholesterol levels checked  more often if:  Your lipid or cholesterol levels are high.  You are older than 78 years of age.  You are at high risk for heart disease.  CANCER SCREENING   Lung Cancer  Lung cancer screening is recommended for adults 17-53 years old who are at high risk for lung cancer because of a history of smoking.  A yearly low-dose CT scan of the lungs is recommended for people who:  Currently smoke.  Have quit within the past 15 years.  Have at least a 30-pack-year history of smoking. A pack year is smoking an average of one pack of cigarettes a day for 1 year.  Yearly screening should continue until it has been 15 years since you quit.  Yearly screening should stop if you develop a health problem that would prevent you from having lung cancer treatment.  Breast Cancer  Practice breast self-awareness. This means understanding how your breasts normally appear and feel.  It also means doing regular breast self-exams. Let your health care provider know about any changes, no matter how small.  If you are in your 20s or 30s, you should have a clinical breast exam (CBE) by a health care provider every 1-3 years as part of a regular health exam.  If you are 18 or older, have a CBE every year. Also consider having a breast X-ray (mammogram) every year.  If you have a family history of breast cancer, talk to your health care provider  about genetic screening.  If you are at high risk for breast cancer, talk to your health care provider about having an MRI and a mammogram every year.  Breast cancer gene (BRCA) assessment is recommended for women who have family members with BRCA-related cancers. BRCA-related cancers include:  Breast.  Ovarian.  Tubal.  Peritoneal cancers.  Results of the assessment will determine the need for genetic counseling and BRCA1 and BRCA2 testing. Cervical Cancer Routine pelvic examinations to screen for cervical cancer are no longer recommended for  nonpregnant women who are considered low risk for cancer of the pelvic organs (ovaries, uterus, and vagina) and who do not have symptoms. A pelvic examination may be necessary if you have symptoms including those associated with pelvic infections. Ask your health care provider if a screening pelvic exam is right for you.   The Pap test is the screening test for cervical cancer for women who are considered at risk.  If you had a hysterectomy for a problem that was not cancer or a condition that could lead to cancer, then you no longer need Pap tests.  If you are older than 65 years, and you have had normal Pap tests for the past 10 years, you no longer need to have Pap tests.  If you have had past treatment for cervical cancer or a condition that could lead to cancer, you need Pap tests and screening for cancer for at least 20 years after your treatment.  If you no longer get a Pap test, assess your risk factors if they change (such as having a new sexual partner). This can affect whether you should start being screened again.  Some women have medical problems that increase their chance of getting cervical cancer. If this is the case for you, your health care provider may recommend more frequent screening and Pap tests.  The human papillomavirus (HPV) test is another test that may be used for cervical cancer screening. The HPV test looks for the virus that can cause cell changes in the cervix. The cells collected during the Pap test can be tested for HPV.  The HPV test can be used to screen women 6 years of age and older. Getting tested for HPV can extend the interval between normal Pap tests from three to five years.  An HPV test also should be used to screen women of any age who have unclear Pap test results.  After 78 years of age, women should have HPV testing as often as Pap tests.  Colorectal Cancer  This type of cancer can be detected and often prevented.  Routine colorectal cancer  screening usually begins at 78 years of age and continues through 78 years of age.  Your health care provider may recommend screening at an earlier age if you have risk factors for colon cancer.  Your health care provider may also recommend using home test kits to check for hidden blood in the stool.  A small camera at the end of a tube can be used to examine your colon directly (sigmoidoscopy or colonoscopy). This is done to check for the earliest forms of colorectal cancer.  Routine screening usually begins at age 21.  Direct examination of the colon should be repeated every 5-10 years through 78 years of age. However, you may need to be screened more often if early forms of precancerous polyps or small growths are found. Skin Cancer  Check your skin from head to toe regularly.  Tell your health care provider  about any new moles or changes in moles, especially if there is a change in a mole's shape or color.  Also tell your health care provider if you have a mole that is larger than the size of a pencil eraser.  Always use sunscreen. Apply sunscreen liberally and repeatedly throughout the day.  Protect yourself by wearing long sleeves, pants, a wide-brimmed hat, and sunglasses whenever you are outside. HEART DISEASE, DIABETES, AND HIGH BLOOD PRESSURE   Have your blood pressure checked at least every 1-2 years. High blood pressure causes heart disease and increases the risk of stroke.  If you are between 55 years and 79 years old, ask your health care provider if you should take aspirin to prevent strokes.  Have regular diabetes screenings. This involves taking a blood sample to check your fasting blood sugar level.  If you are at a normal weight and have a low risk for diabetes, have this test once every three years after 78 years of age.  If you are overweight and have a high risk for diabetes, consider being tested at a younger age or more often. PREVENTING INFECTION  Hepatitis  B  If you have a higher risk for hepatitis B, you should be screened for this virus. You are considered at high risk for hepatitis B if:  You were born in a country where hepatitis B is common. Ask your health care provider which countries are considered high risk.  Your parents were born in a high-risk country, and you have not been immunized against hepatitis B (hepatitis B vaccine).  You have HIV or AIDS.  You use needles to inject street drugs.  You live with someone who has hepatitis B.  You have had sex with someone who has hepatitis B.  You get hemodialysis treatment.  You take certain medicines for conditions, including cancer, organ transplantation, and autoimmune conditions. Hepatitis C  Blood testing is recommended for:  Everyone born from 1945 through 1965.  Anyone with known risk factors for hepatitis C. Sexually transmitted infections (STIs)  You should be screened for sexually transmitted infections (STIs) including gonorrhea and chlamydia if:  You are sexually active and are younger than 78 years of age.  You are older than 78 years of age and your health care provider tells you that you are at risk for this type of infection.  Your sexual activity has changed since you were last screened and you are at an increased risk for chlamydia or gonorrhea. Ask your health care provider if you are at risk.  If you do not have HIV, but are at risk, it may be recommended that you take a prescription medicine daily to prevent HIV infection. This is called pre-exposure prophylaxis (PrEP). You are considered at risk if:  You are sexually active and do not regularly use condoms or know the HIV status of your partner(s).  You take drugs by injection.  You are sexually active with a partner who has HIV. Talk with your health care provider about whether you are at high risk of being infected with HIV. If you choose to begin PrEP, you should first be tested for HIV. You should  then be tested every 3 months for as long as you are taking PrEP.  PREGNANCY   If you are premenopausal and you may become pregnant, ask your health care provider about preconception counseling.  If you may become pregnant, take 400 to 800 micrograms (mcg) of folic acid every day.  If you   want to prevent pregnancy, talk to your health care provider about birth control (contraception). OSTEOPOROSIS AND MENOPAUSE   Osteoporosis is a disease in which the bones lose minerals and strength with aging. This can result in serious bone fractures. Your risk for osteoporosis can be identified using a bone density scan.  If you are 70 years of age or older, or if you are at risk for osteoporosis and fractures, ask your health care provider if you should be screened.  Ask your health care provider whether you should take a calcium or vitamin D supplement to lower your risk for osteoporosis.  Menopause may have certain physical symptoms and risks.  Hormone replacement therapy may reduce some of these symptoms and risks. Talk to your health care provider about whether hormone replacement therapy is right for you.  HOME CARE INSTRUCTIONS   Schedule regular health, dental, and eye exams.  Stay current with your immunizations.   Do not use any tobacco products including cigarettes, chewing tobacco, or electronic cigarettes.  If you are pregnant, do not drink alcohol.  If you are breastfeeding, limit how much and how often you drink alcohol.  Limit alcohol intake to no more than 1 drink per day for nonpregnant women. One drink equals 12 ounces of beer, 5 ounces of wine, or 1 ounces of hard liquor.  Do not use street drugs.  Do not share needles.  Ask your health care provider for help if you need support or information about quitting drugs.  Tell your health care provider if you often feel depressed.  Tell your health care provider if you have ever been abused or do not feel safe at  home. Document Released: 12/28/2010 Document Revised: 10/29/2013 Document Reviewed: 05/16/2013 [facility] Patient Information 2015 [place], [place]. This information is not intended to replace advice given to you by your health care provider. Make sure you discuss any questions you have with your health care provider.

## 2014-08-01 NOTE — Progress Notes (Signed)
Pre visit review using our clinic review tool, if applicable. No additional management support is needed unless otherwise documented below in the visit note. 

## 2014-08-01 NOTE — Progress Notes (Signed)
Subjective:    Patient ID: Jacqueline Mathews, female    DOB: 1935/03/05, 80 y.o.   MRN: [id number]  HPI   80 year old patient who is seen today to reestablish after a several year absence.  She has a history of bilateral hydronephrosis as well as chronic urinary retention.  She feels well today without concerns or complaints She was evaluated in 2011 for GI bleeding and had both upper and lower endoscopies.  This revealed some ulcerative esophagitis.  She is followed by ophthalmology twice annually for glaucoma.  Otherwise, has not seen a doctor in some time  Allergies: 1) ! Bactrim (Sulfamethoxazole-Trimethoprim) 2) ! Bicillin L-A (Penicillin G Benzathine) 3) ! Biaxin (Clarithromycin)  Past History:  Past Medical History: Menopausal syndrome GERD Bilateral hydronephrosis microcytic anemia/NEW 1/11 shingles February 2011 postherpetic neuralgia chronic urinary retention  Past Surgical History: Tonsillectomy G1P1A0 no prior colonoscopies-declines urological procedure for hydronephrosis  1. Risk factors, based on past  M,S,F history-no cardiovascular risk factors  2.  Physical activities: Remains fairly active with yard work.  No exercise restrictions  3.  Depression/mood: No history depression or mood disorder  4.  Hearing: No significant deficits  5.  ADL's: Independent in all aspects of daily living  6.  Fall risk: Low  7.  Home safety: No problems identified  8.  Height weight, and visual acuity; height and weight stable no change in visual acuity.  Is followed by annually by ophthalmology due to a history of glaucoma  9.  Counseling: Heart healthy diet and regular exercise encouraged.  Annual clinical examination is recommended  10. Lab orders based on risk factors: Laboratory update including CBC and renal indices will be reviewed  11. Referral : Not appropriate at this time  12. Care plan: Heart healthy diet and regular exercise encouraged  13.  Cognitive assessment: Alert in order with normal affect.  No cognitive dysfunction  14. Screening: Preventive services will include annual health examinations with screening lab.  Bi- annual mammograms will be considered  15. Provider List Update: Includes primary care and ophthalmology    Review of Systems  Constitutional: Negative for fever, appetite change, fatigue and unexpected weight change.  HENT: Negative for congestion, dental problem, ear pain, hearing loss, mouth sores, nosebleeds, sinus pressure, sore throat, tinnitus, trouble swallowing and voice change.   Eyes: Negative for photophobia, pain, redness and visual disturbance.  Respiratory: Negative for cough, chest tightness and shortness of breath.   Cardiovascular: Negative for chest pain, palpitations and leg swelling.  Gastrointestinal: Negative for nausea, vomiting, abdominal pain, diarrhea, constipation, blood in stool, abdominal distention and rectal pain.  Genitourinary: Negative for dysuria, urgency, frequency, hematuria, flank pain, vaginal bleeding, vaginal discharge, difficulty urinating, genital sores, vaginal pain, menstrual problem and pelvic pain.  Musculoskeletal: Negative for back pain, arthralgias and neck stiffness.  Skin: Negative for rash.  Neurological: Negative for dizziness, syncope, speech difficulty, weakness, light-headedness, numbness and headaches.  Hematological: Negative for adenopathy. Does not bruise/bleed easily.  Psychiatric/Behavioral: Negative for suicidal ideas, behavioral problems, self-injury, dysphoric mood and agitation. The patient is not nervous/anxious.        Objective:   Physical Exam  Constitutional: She is oriented to person, place, and time. She appears well-developed and well-nourished.  HENT:  Head: Normocephalic and atraumatic.  Right Ear: External ear normal.  Left Ear: External ear normal.  Mouth/Throat: Oropharynx is clear and moist.  Arcus senilis  Eyes:  Conjunctivae and EOM are normal.  Neck: Normal range of motion. Neck  supple. No JVD present. No thyromegaly present.  Cardiovascular: Normal rate, regular rhythm and normal heart sounds.   No murmur heard. Pedal pulses not easily palpable  Pulmonary/Chest: Effort normal and breath sounds normal. She has no wheezes. She has no rales.  Abdominal: Soft. Bowel sounds are normal. She exhibits mass. She exhibits no distension. There is no tenderness. There is no rebound and no guarding.  Mass effect to the level of the umbilicus  Musculoskeletal: Normal range of motion. She exhibits no edema or tenderness.  The left lumbar musculature was quite prominent tight and tense Scoliosis noted  Marked osteoarthritic changes of the small joints of the hands  Neurological: She is alert and oriented to person, place, and time. She has normal reflexes. No cranial nerve deficit. She exhibits normal muscle tone. Coordination normal.  Skin: Skin is warm and dry. No rash noted.  Psychiatric: She has a normal mood and affect. Her behavior is normal.          Assessment & Plan:   Preventive health examination Chronic urinary retention History of bilateral hydronephrosis History of GI bleeding  We'll check updated lab including CBC and renal indices. Immunizations updated  Laboratory studies are normal.  Repeat one year

## 2014-08-19 ENCOUNTER — Encounter: Payer: Self-pay | Admitting: Gastroenterology

## 2014-09-18 DIAGNOSIS — H4011X2 Primary open-angle glaucoma, moderate stage: Secondary | ICD-10-CM | POA: Diagnosis not present

## 2015-03-05 DIAGNOSIS — H4011X2 Primary open-angle glaucoma, moderate stage: Secondary | ICD-10-CM | POA: Diagnosis not present

## 2015-09-04 DIAGNOSIS — H348322 Tributary (branch) retinal vein occlusion, left eye, stable: Secondary | ICD-10-CM | POA: Diagnosis not present

## 2015-09-04 DIAGNOSIS — H401131 Primary open-angle glaucoma, bilateral, mild stage: Secondary | ICD-10-CM | POA: Diagnosis not present

## 2015-09-04 DIAGNOSIS — H31012 Macula scars of posterior pole (postinflammatory) (post-traumatic), left eye: Secondary | ICD-10-CM | POA: Diagnosis not present

## 2015-09-04 DIAGNOSIS — H5213 Myopia, bilateral: Secondary | ICD-10-CM | POA: Diagnosis not present

## 2016-03-10 DIAGNOSIS — H401131 Primary open-angle glaucoma, bilateral, mild stage: Secondary | ICD-10-CM | POA: Diagnosis not present

## 2016-04-14 DIAGNOSIS — H401131 Primary open-angle glaucoma, bilateral, mild stage: Secondary | ICD-10-CM | POA: Diagnosis not present

## 2016-04-14 DIAGNOSIS — H2513 Age-related nuclear cataract, bilateral: Secondary | ICD-10-CM | POA: Diagnosis not present

## 2016-04-29 DIAGNOSIS — H401121 Primary open-angle glaucoma, left eye, mild stage: Secondary | ICD-10-CM | POA: Diagnosis not present

## 2016-04-29 DIAGNOSIS — H2512 Age-related nuclear cataract, left eye: Secondary | ICD-10-CM | POA: Diagnosis not present

## 2016-05-28 DIAGNOSIS — H34832 Tributary (branch) retinal vein occlusion, left eye, with macular edema: Secondary | ICD-10-CM | POA: Diagnosis not present

## 2016-07-02 DIAGNOSIS — H34832 Tributary (branch) retinal vein occlusion, left eye, with macular edema: Secondary | ICD-10-CM | POA: Diagnosis not present

## 2017-01-14 DIAGNOSIS — H34832 Tributary (branch) retinal vein occlusion, left eye, with macular edema: Secondary | ICD-10-CM | POA: Diagnosis not present

## 2017-02-14 DIAGNOSIS — H34832 Tributary (branch) retinal vein occlusion, left eye, with macular edema: Secondary | ICD-10-CM | POA: Diagnosis not present

## 2017-03-21 DIAGNOSIS — H34832 Tributary (branch) retinal vein occlusion, left eye, with macular edema: Secondary | ICD-10-CM | POA: Diagnosis not present

## 2017-03-21 DIAGNOSIS — H25811 Combined forms of age-related cataract, right eye: Secondary | ICD-10-CM | POA: Diagnosis not present

## 2017-03-24 DIAGNOSIS — H401131 Primary open-angle glaucoma, bilateral, mild stage: Secondary | ICD-10-CM | POA: Diagnosis not present

## 2017-03-24 DIAGNOSIS — H2511 Age-related nuclear cataract, right eye: Secondary | ICD-10-CM | POA: Diagnosis not present

## 2017-03-30 DIAGNOSIS — H401111 Primary open-angle glaucoma, right eye, mild stage: Secondary | ICD-10-CM | POA: Diagnosis not present

## 2017-03-30 DIAGNOSIS — H2511 Age-related nuclear cataract, right eye: Secondary | ICD-10-CM | POA: Diagnosis not present

## 2017-03-30 DIAGNOSIS — H401131 Primary open-angle glaucoma, bilateral, mild stage: Secondary | ICD-10-CM | POA: Diagnosis not present

## 2017-04-12 DIAGNOSIS — H34832 Tributary (branch) retinal vein occlusion, left eye, with macular edema: Secondary | ICD-10-CM | POA: Diagnosis not present

## 2017-06-10 DIAGNOSIS — H34832 Tributary (branch) retinal vein occlusion, left eye, with macular edema: Secondary | ICD-10-CM | POA: Diagnosis not present

## 2017-09-12 DIAGNOSIS — H401131 Primary open-angle glaucoma, bilateral, mild stage: Secondary | ICD-10-CM | POA: Diagnosis not present

## 2017-10-24 DIAGNOSIS — H401131 Primary open-angle glaucoma, bilateral, mild stage: Secondary | ICD-10-CM | POA: Diagnosis not present

## 2018-05-05 DIAGNOSIS — H401131 Primary open-angle glaucoma, bilateral, mild stage: Secondary | ICD-10-CM | POA: Diagnosis not present

## 2019-02-21 DIAGNOSIS — H401131 Primary open-angle glaucoma, bilateral, mild stage: Secondary | ICD-10-CM | POA: Diagnosis not present

## 2019-02-21 DIAGNOSIS — H34832 Tributary (branch) retinal vein occlusion, left eye, with macular edema: Secondary | ICD-10-CM | POA: Diagnosis not present

## 2019-04-04 DIAGNOSIS — H34832 Tributary (branch) retinal vein occlusion, left eye, with macular edema: Secondary | ICD-10-CM | POA: Diagnosis not present

## 2019-05-09 DIAGNOSIS — H34832 Tributary (branch) retinal vein occlusion, left eye, with macular edema: Secondary | ICD-10-CM | POA: Diagnosis not present

## 2019-06-15 DIAGNOSIS — H34832 Tributary (branch) retinal vein occlusion, left eye, with macular edema: Secondary | ICD-10-CM | POA: Diagnosis not present

## 2019-07-17 ENCOUNTER — Ambulatory Visit: Payer: Medicare Other | Attending: Internal Medicine

## 2019-07-17 DIAGNOSIS — Z23 Encounter for immunization: Secondary | ICD-10-CM

## 2019-07-17 NOTE — Progress Notes (Signed)
   Covid-19 Vaccination Clinic  Name:  AVONNA IRIBE    MRN: [id number] DOB: Jan 20, 1935  07/17/2019  Ms. Peddy was observed post Covid-19 immunization for 15 minutes without incidence. She was provided with Vaccine Information Sheet and instruction to access the V-Safe system.   Ms. Janice was instructed to call 911 with any severe reactions post vaccine: Marland Kitchen Difficulty breathing  . Swelling of your face and throat  . A fast heartbeat  . A bad rash all over your body  . Dizziness and weakness    Immunizations Administered    Name Date Dose VIS Date Route   Pfizer COVID-19 Vaccine 07/17/2019 10:35 AM 0.3 mL 06/08/2019 Intramuscular   Manufacturer: [employer], [employer]   Lot: [id number]   NDC: [address]-2

## 2019-08-07 ENCOUNTER — Ambulatory Visit: Payer: Medicare Other | Attending: Internal Medicine

## 2019-08-07 DIAGNOSIS — Z23 Encounter for immunization: Secondary | ICD-10-CM | POA: Insufficient documentation

## 2019-08-07 NOTE — Progress Notes (Signed)
   Covid-19 Vaccination Clinic  Name:  EMIAH PELLICANO    MRN: [id number] DOB: Aug 20, 1934  08/07/2019  Ms. Lichtman was observed post Covid-19 immunization for 15 minutes without incidence. She was provided with Vaccine Information Sheet and instruction to access the V-Safe system.   Ms. Conrow was instructed to call 911 with any severe reactions post vaccine: Marland Kitchen Difficulty breathing  . Swelling of your face and throat  . A fast heartbeat  . A bad rash all over your body  . Dizziness and weakness    Immunizations Administered    Name Date Dose VIS Date Route   Pfizer COVID-19 Vaccine 08/07/2019 10:09 AM 0.3 mL 06/08/2019 Intramuscular   Manufacturer: [employer], [employer]   Lot: [id number]   NDC: [address]-6

## 2019-08-15 DIAGNOSIS — H34832 Tributary (branch) retinal vein occlusion, left eye, with macular edema: Secondary | ICD-10-CM | POA: Diagnosis not present

## 2019-09-19 DIAGNOSIS — H401131 Primary open-angle glaucoma, bilateral, mild stage: Secondary | ICD-10-CM | POA: Diagnosis not present

## 2019-09-19 DIAGNOSIS — H34832 Tributary (branch) retinal vein occlusion, left eye, with macular edema: Secondary | ICD-10-CM | POA: Diagnosis not present

## 2019-10-30 ENCOUNTER — Emergency Department (HOSPITAL_COMMUNITY): Payer: Medicare Other

## 2019-10-30 ENCOUNTER — Encounter (HOSPITAL_COMMUNITY): Payer: Self-pay

## 2019-10-30 ENCOUNTER — Inpatient Hospital Stay (HOSPITAL_COMMUNITY)
Admission: EM | Admit: 2019-10-30 | Discharge: 2019-11-03 | DRG: 964 | Disposition: A | Discharge status: Skilled Nursing Facility | Payer: Medicare Other | Attending: Internal Medicine | Admitting: Internal Medicine

## 2019-10-30 ENCOUNTER — Other Ambulatory Visit: Payer: Self-pay

## 2019-10-30 DIAGNOSIS — S36892D Contusion of other intra-abdominal organs, subsequent encounter: Secondary | ICD-10-CM | POA: Diagnosis not present

## 2019-10-30 DIAGNOSIS — D62 Acute posthemorrhagic anemia: Secondary | ICD-10-CM | POA: Diagnosis present

## 2019-10-30 DIAGNOSIS — Z882 Allergy status to sulfonamides status: Secondary | ICD-10-CM | POA: Diagnosis not present

## 2019-10-30 DIAGNOSIS — K661 Hemoperitoneum: Secondary | ICD-10-CM | POA: Diagnosis not present

## 2019-10-30 DIAGNOSIS — M6282 Rhabdomyolysis: Secondary | ICD-10-CM | POA: Diagnosis not present

## 2019-10-30 DIAGNOSIS — S3991XA Unspecified injury of abdomen, initial encounter: Secondary | ICD-10-CM | POA: Diagnosis not present

## 2019-10-30 DIAGNOSIS — R2689 Other abnormalities of gait and mobility: Secondary | ICD-10-CM | POA: Diagnosis not present

## 2019-10-30 DIAGNOSIS — Z20822 Contact with and (suspected) exposure to covid-19: Secondary | ICD-10-CM | POA: Diagnosis present

## 2019-10-30 DIAGNOSIS — Z743 Need for continuous supervision: Secondary | ICD-10-CM | POA: Diagnosis not present

## 2019-10-30 DIAGNOSIS — Z03818 Encounter for observation for suspected exposure to other biological agents ruled out: Secondary | ICD-10-CM | POA: Diagnosis not present

## 2019-10-30 DIAGNOSIS — N179 Acute kidney failure, unspecified: Secondary | ICD-10-CM | POA: Diagnosis not present

## 2019-10-30 DIAGNOSIS — S3993XA Unspecified injury of pelvis, initial encounter: Secondary | ICD-10-CM | POA: Diagnosis not present

## 2019-10-30 DIAGNOSIS — Z800 Family history of malignant neoplasm of digestive organs: Secondary | ICD-10-CM

## 2019-10-30 DIAGNOSIS — R110 Nausea: Secondary | ICD-10-CM | POA: Diagnosis not present

## 2019-10-30 DIAGNOSIS — W19XXXA Unspecified fall, initial encounter: Secondary | ICD-10-CM | POA: Diagnosis not present

## 2019-10-30 DIAGNOSIS — E872 Acidosis: Secondary | ICD-10-CM | POA: Diagnosis present

## 2019-10-30 DIAGNOSIS — E860 Dehydration: Secondary | ICD-10-CM | POA: Diagnosis not present

## 2019-10-30 DIAGNOSIS — S0990XA Unspecified injury of head, initial encounter: Secondary | ICD-10-CM | POA: Diagnosis not present

## 2019-10-30 DIAGNOSIS — M6281 Muscle weakness (generalized): Secondary | ICD-10-CM | POA: Diagnosis not present

## 2019-10-30 DIAGNOSIS — Z880 Allergy status to penicillin: Secondary | ICD-10-CM | POA: Diagnosis not present

## 2019-10-30 DIAGNOSIS — R55 Syncope and collapse: Secondary | ICD-10-CM | POA: Diagnosis not present

## 2019-10-30 DIAGNOSIS — R58 Hemorrhage, not elsewhere classified: Secondary | ICD-10-CM

## 2019-10-30 DIAGNOSIS — R278 Other lack of coordination: Secondary | ICD-10-CM | POA: Diagnosis not present

## 2019-10-30 DIAGNOSIS — W1830XA Fall on same level, unspecified, initial encounter: Secondary | ICD-10-CM | POA: Diagnosis present

## 2019-10-30 DIAGNOSIS — S37011A Minor contusion of right kidney, initial encounter: Secondary | ICD-10-CM | POA: Diagnosis present

## 2019-10-30 DIAGNOSIS — I959 Hypotension, unspecified: Secondary | ICD-10-CM | POA: Diagnosis not present

## 2019-10-30 DIAGNOSIS — N135 Crossing vessel and stricture of ureter without hydronephrosis: Secondary | ICD-10-CM | POA: Diagnosis present

## 2019-10-30 DIAGNOSIS — S199XXA Unspecified injury of neck, initial encounter: Secondary | ICD-10-CM | POA: Diagnosis not present

## 2019-10-30 DIAGNOSIS — S37011D Minor contusion of right kidney, subsequent encounter: Secondary | ICD-10-CM | POA: Diagnosis not present

## 2019-10-30 DIAGNOSIS — K219 Gastro-esophageal reflux disease without esophagitis: Secondary | ICD-10-CM | POA: Diagnosis not present

## 2019-10-30 DIAGNOSIS — D500 Iron deficiency anemia secondary to blood loss (chronic): Secondary | ICD-10-CM | POA: Diagnosis not present

## 2019-10-30 DIAGNOSIS — Y92009 Unspecified place in unspecified non-institutional (private) residence as the place of occurrence of the external cause: Secondary | ICD-10-CM | POA: Diagnosis not present

## 2019-10-30 DIAGNOSIS — S299XXA Unspecified injury of thorax, initial encounter: Secondary | ICD-10-CM | POA: Diagnosis not present

## 2019-10-30 DIAGNOSIS — M2550 Pain in unspecified joint: Secondary | ICD-10-CM | POA: Diagnosis not present

## 2019-10-30 DIAGNOSIS — R0902 Hypoxemia: Secondary | ICD-10-CM | POA: Diagnosis not present

## 2019-10-30 DIAGNOSIS — S0993XA Unspecified injury of face, initial encounter: Secondary | ICD-10-CM | POA: Diagnosis not present

## 2019-10-30 DIAGNOSIS — D649 Anemia, unspecified: Secondary | ICD-10-CM | POA: Diagnosis not present

## 2019-10-30 DIAGNOSIS — Z881 Allergy status to other antibiotic agents status: Secondary | ICD-10-CM

## 2019-10-30 DIAGNOSIS — R1310 Dysphagia, unspecified: Secondary | ICD-10-CM | POA: Diagnosis not present

## 2019-10-30 DIAGNOSIS — Z7401 Bed confinement status: Secondary | ICD-10-CM | POA: Diagnosis not present

## 2019-10-30 DIAGNOSIS — S36892A Contusion of other intra-abdominal organs, initial encounter: Secondary | ICD-10-CM | POA: Diagnosis not present

## 2019-10-30 DIAGNOSIS — I10 Essential (primary) hypertension: Secondary | ICD-10-CM | POA: Diagnosis not present

## 2019-10-30 DIAGNOSIS — R4020 Unspecified coma: Secondary | ICD-10-CM | POA: Diagnosis not present

## 2019-10-30 DIAGNOSIS — R933 Abnormal findings on diagnostic imaging of other parts of digestive tract: Secondary | ICD-10-CM | POA: Diagnosis not present

## 2019-10-30 DIAGNOSIS — R2681 Unsteadiness on feet: Secondary | ICD-10-CM | POA: Diagnosis not present

## 2019-10-30 LAB — CBC WITH DIFFERENTIAL/PLATELET
Abs Immature Granulocytes: 0.11 10*3/uL — ABNORMAL HIGH (ref 0.00–0.07)
Basophils Absolute: 0 10*3/uL (ref 0.0–0.1)
Basophils Relative: 0 %
Eosinophils Absolute: 0 10*3/uL (ref 0.0–0.5)
Eosinophils Relative: 0 %
HCT: 29.2 % — ABNORMAL LOW (ref 36.0–46.0)
Hemoglobin: 9.8 g/dL — ABNORMAL LOW (ref 12.0–15.0)
Immature Granulocytes: 1 %
Lymphocytes Relative: 6 %
Lymphs Abs: 1 10*3/uL (ref 0.7–4.0)
MCH: 32.1 pg (ref 26.0–34.0)
MCHC: 33.6 g/dL (ref 30.0–36.0)
MCV: 95.7 fL (ref 80.0–100.0)
Monocytes Absolute: 1.1 10*3/uL — ABNORMAL HIGH (ref 0.1–1.0)
Monocytes Relative: 6 %
Neutro Abs: 16 10*3/uL — ABNORMAL HIGH (ref 1.7–7.7)
Neutrophils Relative %: 87 %
Platelets: 233 10*3/uL (ref 150–400)
RBC: 3.05 MIL/uL — ABNORMAL LOW (ref 3.87–5.11)
RDW: 13.8 % (ref 11.5–15.5)
WBC: 18.2 10*3/uL — ABNORMAL HIGH (ref 4.0–10.5)
nRBC: 0 % (ref 0.0–0.2)

## 2019-10-30 LAB — COMPREHENSIVE METABOLIC PANEL
ALT: 21 U/L (ref 0–44)
AST: 47 U/L — ABNORMAL HIGH (ref 15–41)
Albumin: 3 g/dL — ABNORMAL LOW (ref 3.5–5.0)
Alkaline Phosphatase: 47 U/L (ref 38–126)
Anion gap: 16 — ABNORMAL HIGH (ref 5–15)
BUN: 30 mg/dL — ABNORMAL HIGH (ref 8–23)
CO2: 17 mmol/L — ABNORMAL LOW (ref 22–32)
Calcium: 9 mg/dL (ref 8.9–10.3)
Chloride: 109 mmol/L (ref 98–111)
Creatinine, Ser: 1.34 mg/dL — ABNORMAL HIGH (ref 0.44–1.00)
GFR calc Af Amer: 43 mL/min — ABNORMAL LOW (ref >60)
GFR calc non Af Amer: 37 mL/min — ABNORMAL LOW (ref >60)
Glucose, Bld: 137 mg/dL — ABNORMAL HIGH (ref 70–99)
Potassium: 4.1 mmol/L (ref 3.5–5.1)
Sodium: 142 mmol/L (ref 135–145)
Total Bilirubin: 1.3 mg/dL — ABNORMAL HIGH (ref 0.3–1.2)
Total Protein: 5.6 g/dL — ABNORMAL LOW (ref 6.5–8.1)

## 2019-10-30 LAB — I-STAT CHEM 8, ED
BUN: 28 mg/dL — ABNORMAL HIGH (ref 8–23)
Calcium, Ion: 1.12 mmol/L — ABNORMAL LOW (ref 1.15–1.40)
Chloride: 110 mmol/L (ref 98–111)
Creatinine, Ser: 1 mg/dL (ref 0.44–1.00)
Glucose, Bld: 132 mg/dL — ABNORMAL HIGH (ref 70–99)
HCT: 27 % — ABNORMAL LOW (ref 36.0–46.0)
Hemoglobin: 9.2 g/dL — ABNORMAL LOW (ref 12.0–15.0)
Potassium: 3.9 mmol/L (ref 3.5–5.1)
Sodium: 142 mmol/L (ref 135–145)
TCO2: 20 mmol/L — ABNORMAL LOW (ref 22–32)

## 2019-10-30 LAB — PROTIME-INR
INR: 1 (ref 0.8–1.2)
Prothrombin Time: 13 seconds (ref 11.4–15.2)

## 2019-10-30 LAB — TROPONIN I (HIGH SENSITIVITY)
Troponin I (High Sensitivity): 34 ng/L — ABNORMAL HIGH (ref <18)
Troponin I (High Sensitivity): 48 ng/L — ABNORMAL HIGH (ref <18)

## 2019-10-30 LAB — LACTIC ACID, PLASMA
Lactic Acid, Venous: 3.2 mmol/L (ref 0.5–1.9)
Lactic Acid, Venous: 1.6 mmol/L (ref 0.5–1.9)

## 2019-10-30 LAB — ABO/RH: ABO/RH(D): O POS

## 2019-10-30 LAB — ETHANOL: Alcohol, Ethyl (B): <10 mg/dL (ref <10)

## 2019-10-30 LAB — RESPIRATORY PANEL BY RT PCR (FLU A&B, COVID)
Influenza A by PCR: NEGATIVE
Influenza B by PCR: NEGATIVE
SARS Coronavirus 2 by RT PCR: NEGATIVE

## 2019-10-30 LAB — LIPASE, BLOOD: Lipase: 18 U/L (ref 11–51)

## 2019-10-30 LAB — POC OCCULT BLOOD, ED: Fecal Occult Bld: NEGATIVE

## 2019-10-30 MED ORDER — ONDANSETRON HCL 4 MG/2ML IJ SOLN
4.0000 mg | Freq: Once | INTRAMUSCULAR | Status: AC
  Administered 2019-10-30: 4 mg via INTRAVENOUS
  Filled 2019-10-30: qty 2

## 2019-10-30 MED ORDER — PANTOPRAZOLE SODIUM 40 MG IV SOLR
40.0000 mg | Freq: Once | INTRAVENOUS | Status: AC
  Administered 2019-10-30: 40 mg via INTRAVENOUS
  Filled 2019-10-30: qty 40

## 2019-10-30 MED ORDER — CIPROFLOXACIN IN D5W 400 MG/200ML IV SOLN
400.0000 mg | Freq: Once | INTRAVENOUS | Status: AC
  Administered 2019-10-30: 400 mg via INTRAVENOUS
  Filled 2019-10-30: qty 200

## 2019-10-30 MED ORDER — METRONIDAZOLE IN NACL 5-0.79 MG/ML-% IV SOLN
500.0000 mg | Freq: Once | INTRAVENOUS | Status: AC
  Administered 2019-10-30: 500 mg via INTRAVENOUS
  Filled 2019-10-30: qty 100

## 2019-10-30 MED ORDER — SODIUM CHLORIDE 0.9 % IV BOLUS
500.0000 mL | Freq: Once | INTRAVENOUS | Status: AC
  Administered 2019-10-30: 500 mL via INTRAVENOUS

## 2019-10-30 MED ORDER — IOHEXOL 300 MG/ML  SOLN
100.0000 mL | Freq: Once | INTRAMUSCULAR | Status: AC | PRN
  Administered 2019-10-30: 100 mL via INTRAVENOUS

## 2019-10-30 NOTE — Consult Note (Signed)
Surgical Evaluation Requesting provider: Dr. Rodena MedinMessick  Chief Complaint: Fall  HPI: 85 year old woman in relatively good health who presents to the ER after a fall which occurred yesterday around 3:30 PM.  She was watching her daughter's large dog and she thinks the dog may have pulled her down.  She states that she felt like she fainted, and wound up lying on her abdomen on the kitchen floor.  She was there from 3:30 PM yesterday until this evening when she was found because she felt too weak to get up.  She states that she is just sore all over, but denies significant abdominal pain.  She reports her last bowel movement was yesterday and was normal.  She denies any prior abdominal surgeries.  She has been worked up with a CT of the head, C-spine, chest abdomen pelvis with the final read below, and surgery is consulted regarding mesenteric hematoma versus other etiology of a large heterogenous fluid lesion in the lower abdomen.   Allergies  Allergen Reactions  . Clarithromycin Hives  . Penicillin G Benzathine Hives  . Sulfamethoxazole-Trimethoprim Hives    History reviewed. No pertinent past medical history.  History reviewed. No pertinent surgical history.  History reviewed. No pertinent family history.  Social History   Socioeconomic History  . Marital status: Widowed    Spouse name: Not on file  . Number of children: Not on file  . Years of education: Not on file  . Highest education level: Not on file  Occupational History  . Not on file  Tobacco Use  . Smoking status: Never Smoker  . Smokeless tobacco: Never Used  Substance and Sexual Activity  . Alcohol use: No    Alcohol/week: 0.0 standard drinks  . Drug use: No  . Sexual activity: Not on file  Other Topics Concern  . Not on file  Social History Narrative  . Not on file   Social Determinants of Health   Financial Resource Strain:   . Difficulty of Paying Living Expenses:   Food Insecurity:   . Worried About  Programme researcher, broadcasting/film/videounning Out of Food in the Last Year:   . Baristaan Out of Food in the Last Year:   Transportation Needs:   . Freight forwarderLack of Transportation (Medical):   Marland Kitchen. Lack of Transportation (Non-Medical):   Physical Activity:   . Days of Exercise per Week:   . Minutes of Exercise per Session:   Stress:   . Feeling of Stress :   Social Connections:   . Frequency of Communication with Friends and Family:   . Frequency of Social Gatherings with Friends and Family:   . Attends Religious Services:   . Active Member of Clubs or Organizations:   . Attends BankerClub or Organization Meetings:   Marland Kitchen. Marital Status:     No current facility-administered medications on file prior to encounter.   Current Outpatient Medications on File Prior to Encounter  Medication Sig Dispense Refill  . bimatoprost (LUMIGAN) 0.01 % SOLN Place 1 drop into both eyes at bedtime.     . timolol (TIMOPTIC) 0.25 % ophthalmic solution Place 1 drop into both eyes in the morning and at bedtime.      Review of Systems: a complete, 10pt review of systems was completed with pertinent positives and negatives as documented in the HPI  Physical Exam: Vitals:   10/30/19 2030 10/30/19 2045  BP: 139/74 138/81  Pulse: (!) 107 (!) 107  Resp: 18 19  Temp:    SpO2: 97% 97%  Gen: A&Ox3, no distress  Eyes: lids and conjunctivae normal, no icterus. Pupils equally round and reactive to light.  Neck: supple without mass or thyromegaly no C-spine tenderness Chest: respiratory effort is normal. No crepitus or tenderness on palpation of the chest. Breath sounds equal.  Cardiovascular: RRR with palpable distal pulses, no pedal edema Gastrointestinal: soft, nondistended, minimally tender in the left lower quadrant, no peritoneal signs.  No hepatomegaly or splenomegaly, palpable fullness in the left lower quadrant Muscoloskeletal: no clubbing or cyanosis of the fingers.  Strength is symmetrical throughout.  Range of motion of bilateral upper and lower extremities  normal without pain, crepitation or contracture. Neuro: cranial nerves grossly intact.  Sensation intact to light touch diffusely. Psych: appropriate mood and affect, normal insight/judgment intact  Skin: warm and dry   CBC Latest Ref Rng & Units 10/30/2019 10/30/2019 08/01/2014  WBC 4.0 - 10.5 K/uL - 18.2(H) 8.3  Hemoglobin 12.0 - 15.0 g/dL 1.6(X) 0.9(U) 15.9(H)  Hematocrit 36.0 - 46.0 % 27.0(L) 29.2(L) 46.7(H)  Platelets 150 - 400 K/uL - 233 228.0    CMP Latest Ref Rng & Units 10/30/2019 10/30/2019 08/01/2014  Glucose 70 - 99 mg/dL 045(W) 098(J) 87  BUN 8 - 23 mg/dL 19(J) 47(W) 15  Creatinine 0.44 - 1.00 mg/dL 2.95 6.21(H) 0.86  Sodium 135 - 145 mmol/L 142 142 140  Potassium 3.5 - 5.1 mmol/L 3.9 4.1 6.0(H)  Chloride 98 - 111 mmol/L 110 109 106  CO2 22 - 32 mmol/L - 17(L) 27  Calcium 8.9 - 10.3 mg/dL - 9.0 57.8  Total Protein 6.5 - 8.1 g/dL - 5.6(L) 7.4  Total Bilirubin 0.3 - 1.2 mg/dL - 1.3(H) 0.7  Alkaline Phos 38 - 126 U/L - 47 86  AST 15 - 41 U/L - 47(H) 21  ALT 0 - 44 U/L - 21 18    Lab Results  Component Value Date   INR 1.0 10/30/2019    Imaging: DG Pelvis 1-2 Views  Result Date: 10/30/2019 CLINICAL DATA:  Syncope EXAM: PELVIS - 1-2 VIEW COMPARISON:  None. FINDINGS: Supine frontal view of the pelvis excludes portions of the proximal left femur. No acute displaced fracture. Symmetrical bilateral hip osteoarthritis. Left convex scoliosis of the lumbar spine. Sacroiliac joints are normal. IMPRESSION: 1. No acute displaced fracture. Electronically Signed   By: Sharlet Salina M.D.   On: 10/30/2019 20:51   CT Head Wo Contrast  Result Date: 10/30/2019 CLINICAL DATA:  85 year old female with trauma. EXAM: CT HEAD WITHOUT CONTRAST CT MAXILLOFACIAL WITHOUT CONTRAST CT CERVICAL SPINE WITHOUT CONTRAST TECHNIQUE: Multidetector CT imaging of the head, cervical spine, and maxillofacial structures were performed using the standard protocol without intravenous contrast. Multiplanar CT image  reconstructions of the cervical spine and maxillofacial structures were also generated. COMPARISON:  None. FINDINGS: CT HEAD FINDINGS Brain: Moderate age-related atrophy and chronic microvascular ischemic changes. There is no acute intracranial hemorrhage. No mass effect or midline shift. No extra-axial fluid collection. A 5 mm rounded calcified structure to the right of the anterior falx may represent a small calcified meningioma. Vascular: No hyperdense vessel or unexpected calcification. Skull: Normal. Negative for fracture or focal lesion. Other: None CT MAXILLOFACIAL FINDINGS Osseous: No acute fracture. No mandibular subluxation Orbits: The globes and retro-orbital fat are preserved. Sinuses: Clear. Soft tissues: Negative. CT CERVICAL SPINE FINDINGS Alignment: No acute subluxation. Skull base and vertebrae: No acute fracture. Osteopenia. Soft tissues and spinal canal: No prevertebral fluid or swelling. No visible canal hematoma. Disc levels:  No acute findings.  Multilevel degenerative changes. Upper chest: Emphysema. Other: Bilateral carotid bulb calcified plaques. IMPRESSION: 1. No acute intracranial hemorrhage. Age-related atrophy and chronic microvascular ischemic changes. 2. No acute/traumatic cervical spine pathology. 3. No acute facial bone fractures. Electronically Signed   By: Elgie Collard M.D.   On: 10/30/2019 21:38   CT Chest W Contrast  Result Date: 10/30/2019 CLINICAL DATA:  [place]are[place] Se[place] yesterday, found down, hematemesis, syncope EXAM: CT CHEST, ABDOMEN, AND PELVIS WITH CONTRAST TECHNIQUE: Multidetector CT imaging of the chest, abdomen and pelvis was performed following the standard protocol during bolus administration of intravenous contrast. CONTRAST:  OMNIPAQUE IOHEXOL 300 MG/ML  SOLN COMPARISON:  None. FINDINGS: CT CHEST FINDINGS Cardiovascular: Heart and great vessels are unremarkable without pericardial effusion. Significant atherosclerosis of the aorta and coronary vessels. No evidence  of thoracic aortic aneurysm or dissection. Mediastinum/Nodes: There is a large hiatal hernia. 1 cm hypodensity left lobe thyroid does not warrant further follow-up based on size and patient age. Trachea and esophagus are unremarkable. No pathologic adenopathy. Lungs/Pleura: No airspace disease, effusion, or pneumothorax. Central airways are patent. Musculoskeletal: No acute displaced fractures. Rotatory scoliosis of the thoracolumbar spine. Reconstructed images demonstrate no additional findings. CT ABDOMEN PELVIS FINDINGS Hepatobiliary: There are innumerable hepatic cysts replacing the majority of the left lobe of the liver. Gallbladder is moderately distended small calcified gallstones. No evidence of cholecystitis. Pancreas: Unremarkable. No pancreatic ductal dilatation or surrounding inflammatory changes. Spleen: No splenic injury or perisplenic hematoma. Adrenals/Urinary Tract: The left kidney has been removed in the interim. Large extrarenal pelvis right kidney again seen. No evidence of hydronephrosis. Nonobstructing 7 mm calculus lower pole right kidney. The bladder is grossly unremarkable. The adrenals are not identified. Stomach/Bowel: There is a large hiatal hernia with the majority of the stomach in an intrathoracic position. There is no bowel obstruction or ileus. Diverticulosis of the sigmoid colon without diverticulitis. Vascular/Lymphatic: There is extensive atherosclerosis of the aorta and its distal branches. No pathologic adenopathy. Reproductive: Uterus is atrophic.  No adnexal masses. Other: Heterogeneous free fluid is seen throughout the abdomen and pelvis. Fluid in the right flank is fairly simple in appearance compatible with bland ascites. Heterogeneous fluid of mixed attenuation within the central abdomen and pelvis extends approximately 16.7 x 10.8 cm in transverse dimension, consistent with intraperitoneal hemorrhage and likely clot. An underlying mesenteric mass would be difficult to  exclude delayed imaging through this region does not demonstrate any pooling of contrast to suggest ongoing hemorrhage. There is no free intra-abdominal gas.  No abdominal wall hernia. Musculoskeletal: No acute displaced fractures. Reconstructed images demonstrate no additional findings. IMPRESSION: 1. Heterogeneous process in the central abdomen and pelvis, likely a large mesenteric hematoma. No contrast extravasation to suggest active hemorrhage. Underlying mass in this region cannot be excluded, and appropriate follow-up will be required. 2. No acute intrathoracic trauma. 3. Nonobstructing right renal calculus. The left kidney is not identified and likely surgically absent. 4. Innumerable hepatic cysts. 5. Large hiatal hernia. 6. 1 cm left lobe thyroid nodule. Not clinically significant; no follow-up imaging recommended. (Ref: J Am Coll Radiol. 2015 Feb;12(2): 143-50). 7.  Aortic Atherosclerosis (ICD10-I70.0). These results were called by telephone at the time of interpretation on 10/30/2019 at 9:48 pm to provider [facility] , who verbally acknowledged these results. Electronically Signed   By: Sharlet Salina M.D.   On: 10/30/2019 21:48   CT Cervical Spine Wo Contrast  Result Date: 10/30/2019 CLINICAL DATA:  85 year old female with trauma. EXAM: CT HEAD WITHOUT CONTRAST CT  MAXILLOFACIAL WITHOUT CONTRAST CT CERVICAL SPINE WITHOUT CONTRAST TECHNIQUE: Multidetector CT imaging of the head, cervical spine, and maxillofacial structures were performed using the standard protocol without intravenous contrast. Multiplanar CT image reconstructions of the cervical spine and maxillofacial structures were also generated. COMPARISON:  None. FINDINGS: CT HEAD FINDINGS Brain: Moderate age-related atrophy and chronic microvascular ischemic changes. There is no acute intracranial hemorrhage. No mass effect or midline shift. No extra-axial fluid collection. A 5 mm rounded calcified structure to the right of the anterior falx  may represent a small calcified meningioma. Vascular: No hyperdense vessel or unexpected calcification. Skull: Normal. Negative for fracture or focal lesion. Other: None CT MAXILLOFACIAL FINDINGS Osseous: No acute fracture. No mandibular subluxation Orbits: The globes and retro-orbital fat are preserved. Sinuses: Clear. Soft tissues: Negative. CT CERVICAL SPINE FINDINGS Alignment: No acute subluxation. Skull base and vertebrae: No acute fracture. Osteopenia. Soft tissues and spinal canal: No prevertebral fluid or swelling. No visible canal hematoma. Disc levels:  No acute findings. Multilevel degenerative changes. Upper chest: Emphysema. Other: Bilateral carotid bulb calcified plaques. IMPRESSION: 1. No acute intracranial hemorrhage. Age-related atrophy and chronic microvascular ischemic changes. 2. No acute/traumatic cervical spine pathology. 3. No acute facial bone fractures. Electronically Signed   By: Anner Crete M.D.   On: 10/30/2019 21:38   CT ABDOMEN PELVIS W CONTRAST  Result Date: 10/30/2019 CLINICAL DATA:  [place] yesterday, found down, hematemesis, syncope EXAM: CT CHEST, ABDOMEN, AND PELVIS WITH CONTRAST TECHNIQUE: Multidetector CT imaging of the chest, abdomen and pelvis was performed following the standard protocol during bolus administration of intravenous contrast. CONTRAST:  155mL OMNIPAQUE IOHEXOL 300 MG/ML  SOLN COMPARISON:  None. FINDINGS: CT CHEST FINDINGS Cardiovascular: Heart and great vessels are unremarkable without pericardial effusion. Significant atherosclerosis of the aorta and coronary vessels. No evidence of thoracic aortic aneurysm or dissection. Mediastinum/Nodes: There is a large hiatal hernia. 1 cm hypodensity left lobe thyroid does not warrant further follow-up based on size and patient age. Trachea and esophagus are unremarkable. No pathologic adenopathy. Lungs/Pleura: No airspace disease, effusion, or pneumothorax. Central airways are patent. Musculoskeletal: No acute  displaced fractures. Rotatory scoliosis of the thoracolumbar spine. Reconstructed images demonstrate no additional findings. CT ABDOMEN PELVIS FINDINGS Hepatobiliary: There are innumerable hepatic cysts replacing the majority of the left lobe of the liver. Gallbladder is moderately distended small calcified gallstones. No evidence of cholecystitis. Pancreas: Unremarkable. No pancreatic ductal dilatation or surrounding inflammatory changes. Spleen: No splenic injury or perisplenic hematoma. Adrenals/Urinary Tract: The left kidney has been removed in the interim. Large extrarenal pelvis right kidney again seen. No evidence of hydronephrosis. Nonobstructing 7 mm calculus lower pole right kidney. The bladder is grossly unremarkable. The adrenals are not identified. Stomach/Bowel: There is a large hiatal hernia with the majority of the stomach in an intrathoracic position. There is no bowel obstruction or ileus. Diverticulosis of the sigmoid colon without diverticulitis. Vascular/Lymphatic: There is extensive atherosclerosis of the aorta and its distal branches. No pathologic adenopathy. Reproductive: Uterus is atrophic.  No adnexal masses. Other: Heterogeneous free fluid is seen throughout the abdomen and pelvis. Fluid in the right flank is fairly simple in appearance compatible with bland ascites. Heterogeneous fluid of mixed attenuation within the central abdomen and pelvis extends approximately 16.7 x 10.8 cm in transverse dimension, consistent with intraperitoneal hemorrhage and likely clot. An underlying mesenteric mass would be difficult to exclude delayed imaging through this region does not demonstrate any pooling of contrast to suggest ongoing hemorrhage. There is no free intra-abdominal gas.  No abdominal wall hernia. Musculoskeletal: No acute displaced fractures. Reconstructed images demonstrate no additional findings. IMPRESSION: 1. Heterogeneous process in the central abdomen and pelvis, likely a large  mesenteric hematoma. No contrast extravasation to suggest active hemorrhage. Underlying mass in this region cannot be excluded, and appropriate follow-up will be required. 2. No acute intrathoracic trauma. 3. Nonobstructing right renal calculus. The left kidney is not identified and likely surgically absent. 4. Innumerable hepatic cysts. 5. Large hiatal hernia. 6. 1 cm left lobe thyroid nodule. Not clinically significant; no follow-up imaging recommended. (Ref: J Am Coll Radiol. 2015 Feb;12(2): 143-50). 7.  Aortic Atherosclerosis (ICD10-I70.0). These results were called by telephone at the time of interpretation on 10/30/2019 at 9:48 pm to provider [facility] , who verbally acknowledged these results. Electronically Signed   By: Sharlet Salina M.D.   On: 10/30/2019 21:48   DG Chest Port 1 View  Result Date: 10/30/2019 CLINICAL DATA:  Syncope EXAM: PORTABLE CHEST 1 VIEW COMPARISON:  04/09/2005 FINDINGS: Single frontal view of the chest demonstrates a stable cardiac silhouette. Atherosclerosis of the thoracic aorta. No airspace disease, effusion, or pneumothorax. Stable rotatory scoliosis of the thoracolumbar spine. IMPRESSION: No acute intrathoracic process. Electronically Signed   By: Sharlet Salina M.D.   On: 10/30/2019 20:50   CT Maxillofacial Wo Contrast  Result Date: 10/30/2019 CLINICAL DATA:  85 year old female with trauma. EXAM: CT HEAD WITHOUT CONTRAST CT MAXILLOFACIAL WITHOUT CONTRAST CT CERVICAL SPINE WITHOUT CONTRAST TECHNIQUE: Multidetector CT imaging of the head, cervical spine, and maxillofacial structures were performed using the standard protocol without intravenous contrast. Multiplanar CT image reconstructions of the cervical spine and maxillofacial structures were also generated. COMPARISON:  None. FINDINGS: CT HEAD FINDINGS Brain: Moderate age-related atrophy and chronic microvascular ischemic changes. There is no acute intracranial hemorrhage. No mass effect or midline shift. No  extra-axial fluid collection. A 5 mm rounded calcified structure to the right of the anterior falx may represent a small calcified meningioma. Vascular: No hyperdense vessel or unexpected calcification. Skull: Normal. Negative for fracture or focal lesion. Other: None CT MAXILLOFACIAL FINDINGS Osseous: No acute fracture. No mandibular subluxation Orbits: The globes and retro-orbital fat are preserved. Sinuses: Clear. Soft tissues: Negative. CT CERVICAL SPINE FINDINGS Alignment: No acute subluxation. Skull base and vertebrae: No acute fracture. Osteopenia. Soft tissues and spinal canal: No prevertebral fluid or swelling. No visible canal hematoma. Disc levels:  No acute findings. Multilevel degenerative changes. Upper chest: Emphysema. Other: Bilateral carotid bulb calcified plaques. IMPRESSION: 1. No acute intracranial hemorrhage. Age-related atrophy and chronic microvascular ischemic changes. 2. No acute/traumatic cervical spine pathology. 3. No acute facial bone fractures. Electronically Signed   By: Elgie Collard M.D.   On: 10/30/2019 21:38     A/P: 85 year old woman who sustained a ground-level fall over 24 hours ago.  She has numerous abnormal findings on her abdominal CT including multiple liver cysts and an absent left kidney, as well as heterogenous fluid of mixed attenuation in the central abdomen and pelvis which while it appears to be consistent with intraperitoneal hemorrhage, this would be an unlikely injury pattern for the mechanism and I think more likely underlying neoplasm should be considered.  She will be admitted for observation to the medicine service, trauma team will follow along. Ok for sips/ ice chips tonight.   Patient Active Problem List   Diagnosis Date Noted  . 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 SHINGLES 08/25/2009  . Iron deficiency anemia 07/18/2009  . BLOOD IN STOOL, OCCULT 07/18/2009  .  ANEMIA, SECONDARY TO CHRONIC BLOOD LOSS 07/17/2009  . GERD 01/03/2007  .  HYDRONEPHROSIS, BILATERAL 01/03/2007  . MENOPAUSAL SYNDROME 01/03/2007       Phylliss Blakes, MD [facility]  See AMION to contact appropriate on-call provider

## 2019-10-30 NOTE — ED Notes (Signed)
[name] l please call with an update  [phone]

## 2019-10-30 NOTE — ED Triage Notes (Signed)
Pt bib [facility] w/ c/o syncope and fall. Pt fell at 1530 yesterday and found today at 1820. Pt states she may have had a syncopal episode. Pt endorses emesis episode with possible blood. Pt AOx4, EMS VSS. Pt endorses bilat hip pain.

## 2019-10-30 NOTE — ED Provider Notes (Signed)
[facility] EMERGENCY DEPARTMENT Provider Note   CSN: [id number] Arrival date & time: 10/30/19  1909     History Chief Complaint  Patient presents with   Near Syncope    Jacqueline Mathews is a 85 y.o. female.  85 year old female with prior medical history as detailed below presents for evaluation following being found on the floor of her residence.  Patient reports that yesterday she fell onto her chest and abdomen while at home.  She complains that she was too weak to get off the floor.  She may have been on the floor of her residence for close to 20 hours.  She reports lower abdominal discomfort.  She complains of nausea and vomiting.  She denies chest pain or shortness of breath.  She denies specific head injury.  She denies LOC.  The history is provided by the patient and medical records.  Fall This is a new problem. The current episode started yesterday. The problem occurs rarely. The problem has not changed since onset.Associated symptoms include abdominal pain. Pertinent negatives include no chest pain. Nothing aggravates the symptoms. Nothing relieves the symptoms.       History reviewed. No pertinent past medical history.  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 No pertinent surgical history.   OB History   No obstetric history on file.     History reviewed. No pertinent family history.  Social History   Tobacco Use   Smoking status: Never Smoker   Smokeless tobacco: Never Used  Substance Use Topics   Alcohol use: No    Alcohol/week: 0.0 standard drinks   Drug use: No    Home Medications Prior to Admission medications    Medication Sig Start Date End Date Taking? Authorizing Provider  bimatoprost (LUMIGAN) 0.01 % SOLN Place 1 drop into both eyes at bedtime.    Yes [provider]  timolol (TIMOPTIC) 0.25 % ophthalmic solution Place 1 drop into both eyes in the morning and at bedtime. 10/19/19  Yes [provider]    Allergies    Clarithromycin, Penicillin g benzathine, and Sulfamethoxazole-trimethoprim  Review of Systems   Review of Systems  Cardiovascular: Negative for chest pain.  Gastrointestinal: Positive for abdominal pain.  All other systems reviewed and are negative.   Physical Exam Updated Vital Signs BP 138/81    Pulse (!) 107    Temp 97.7 F (36.5 C)    Resp 19    SpO2 97%   Physical Exam Vitals and nursing note reviewed.  Constitutional:      General: She is not in acute distress.    Appearance: She is well-developed. She is ill-appearing.  HENT:     Head: Normocephalic and atraumatic.  Eyes:     Conjunctiva/sclera: Conjunctivae normal.     Pupils: Pupils are equal, round, and reactive to light.  Cardiovascular:     Rate and Rhythm: Normal rate and regular rhythm.     Heart sounds: Normal heart sounds.  Pulmonary:     Effort: Pulmonary effort is normal. No respiratory distress.     Breath sounds: Normal breath sounds.  Abdominal:     General: There is no distension.  Palpations: Abdomen is soft.     Tenderness: There is abdominal tenderness.     Comments: Mild tenderness to the left lower abdomen without rebound or guarding.  Musculoskeletal:        General: No deformity. Normal range of motion.     Cervical back: Normal range of motion and neck supple.  Skin:    General: Skin is warm and dry.  Neurological:     General: No focal deficit present.     Mental Status: She is alert and oriented to person, place, and time.     ED Results / Procedures / Treatments   Labs (all labs ordered are listed, but only abnormal results are displayed) Labs  Reviewed  COMPREHENSIVE METABOLIC PANEL - Abnormal; Notable for the following components:      Result Value   CO2 17 (*)    Glucose, Bld 137 (*)    BUN 30 (*)    Creatinine, Ser 1.34 (*)    Total Protein 5.6 (*)    Albumin 3.0 (*)    AST 47 (*)    Total Bilirubin 1.3 (*)    GFR calc non Af Amer 37 (*)    GFR calc Af Amer 43 (*)    Anion gap 16 (*)    All other components within normal limits  LACTIC ACID, PLASMA - Abnormal; Notable for the following components:   Lactic Acid, Venous 3.2 (*)    All other components within normal limits  CBC WITH DIFFERENTIAL/PLATELET - Abnormal; Notable for the following components:   WBC 18.2 (*)    RBC 3.05 (*)    Hemoglobin 9.8 (*)    HCT 29.2 (*)    Neutro Abs 16.0 (*)    Monocytes Absolute 1.1 (*)    Abs Immature Granulocytes 0.11 (*)    All other components within normal limits  I-STAT CHEM 8, ED - Abnormal; Notable for the following components:   BUN 28 (*)    Glucose, Bld 132 (*)    Calcium, Ion 1.12 (*)    TCO2 20 (*)    Hemoglobin 9.2 (*)    HCT 27.0 (*)    All other components within normal limits  TROPONIN I (HIGH SENSITIVITY) - Abnormal; Notable for the following components:   Troponin I (High Sensitivity) 34 (*)    All other components within normal limits  RESPIRATORY PANEL BY RT PCR (FLU A&B, COVID)  ETHANOL  PROTIME-INR  LIPASE, BLOOD  URINALYSIS, ROUTINE W REFLEX MICROSCOPIC  LACTIC ACID, PLASMA  RAPID URINE DRUG SCREEN, HOSP PERFORMED  POC OCCULT BLOOD, ED  TYPE AND SCREEN  ABO/RH  TROPONIN I (HIGH SENSITIVITY)    EKG EKG Interpretation  Date/Time:  Tuesday Oct 30 2019 19:40:10 EDT Ventricular Rate:  112 PR Interval:    QRS Duration: 109 QT Interval:  341 QTC Calculation: 466 R Axis:   -168 Text Interpretation: Sinus tachycardia Right axis deviation Borderline repolarization abnormality Confirmed by Kristine Royal [phone]) on 10/30/2019 7:47:30 PM   Radiology DG Pelvis 1-2 Views  Result Date:  10/30/2019 CLINICAL DATA:  Syncope EXAM: PELVIS - 1-2 VIEW COMPARISON:  None. FINDINGS: Supine frontal view of the pelvis excludes portions of the proximal left femur. No acute displaced fracture. Symmetrical bilateral hip osteoarthritis. Left convex scoliosis of the lumbar spine. Sacroiliac joints are normal. IMPRESSION: 1. No acute displaced fracture. Electronically Signed   By: Sharlet Salina M.D.   On: 10/30/2019 20:51   CT Head Wo Contrast  Result Date: 10/30/2019  CLINICAL DATA:  85 year old female with trauma. EXAM: CT HEAD WITHOUT CONTRAST CT MAXILLOFACIAL WITHOUT CONTRAST CT CERVICAL SPINE WITHOUT CONTRAST TECHNIQUE: Multidetector CT imaging of the head, cervical spine, and maxillofacial structures were performed using the standard protocol without intravenous contrast. Multiplanar CT image reconstructions of the cervical spine and maxillofacial structures were also generated. COMPARISON:  None. FINDINGS: CT HEAD FINDINGS Brain: Moderate age-related atrophy and chronic microvascular ischemic changes. There is no acute intracranial hemorrhage. No mass effect or midline shift. No extra-axial fluid collection. A 5 mm rounded calcified structure to the right of the anterior falx may represent a small calcified meningioma. Vascular: No hyperdense vessel or unexpected calcification. Skull: Normal. Negative for fracture or focal lesion. Other: None CT MAXILLOFACIAL FINDINGS Osseous: No acute fracture. No mandibular subluxation Orbits: The globes and retro-orbital fat are preserved. Sinuses: Clear. Soft tissues: Negative. CT CERVICAL SPINE FINDINGS Alignment: No acute subluxation. Skull base and vertebrae: No acute fracture. Osteopenia. Soft tissues and spinal canal: No prevertebral fluid or swelling. No visible canal hematoma. Disc levels:  No acute findings. Multilevel degenerative changes. Upper chest: Emphysema. Other: Bilateral carotid bulb calcified plaques. IMPRESSION: 1. No acute intracranial hemorrhage.  Age-related atrophy and chronic microvascular ischemic changes. 2. No acute/traumatic cervical spine pathology. 3. No acute facial bone fractures. Electronically Signed   By: Anner Crete M.D.   On: 10/30/2019 21:38   CT Chest W Contrast  Result Date: 10/30/2019 CLINICAL DATA:  [place] yesterday, found down, hematemesis, syncope EXAM: CT CHEST, ABDOMEN, AND PELVIS WITH CONTRAST TECHNIQUE: Multidetector CT imaging of the chest, abdomen and pelvis was performed following the standard protocol during bolus administration of intravenous contrast. CONTRAST:  121mL OMNIPAQUE IOHEXOL 300 MG/ML  SOLN COMPARISON:  None. FINDINGS: CT CHEST FINDINGS Cardiovascular: Heart and great vessels are unremarkable without pericardial effusion. Significant atherosclerosis of the aorta and coronary vessels. No evidence of thoracic aortic aneurysm or dissection. Mediastinum/Nodes: There is a large hiatal hernia. 1 cm hypodensity left lobe thyroid does not warrant further follow-up based on size and patient age. Trachea and esophagus are unremarkable. No pathologic adenopathy. Lungs/Pleura: No airspace disease, effusion, or pneumothorax. Central airways are patent. Musculoskeletal: No acute displaced fractures. Rotatory scoliosis of the thoracolumbar spine. Reconstructed images demonstrate no additional findings. CT ABDOMEN PELVIS FINDINGS Hepatobiliary: There are innumerable hepatic cysts replacing the majority of the left lobe of the liver. Gallbladder is moderately distended small calcified gallstones. No evidence of cholecystitis. Pancreas: Unremarkable. No pancreatic ductal dilatation or surrounding inflammatory changes. Spleen: No splenic injury or perisplenic hematoma. Adrenals/Urinary Tract: The left kidney has been removed in the interim. Large extrarenal pelvis right kidney again seen. No evidence of hydronephrosis. Nonobstructing 7 mm calculus lower pole right kidney. The bladder is grossly unremarkable. The adrenals are not  identified. Stomach/Bowel: There is a large hiatal hernia with the majority of the stomach in an intrathoracic position. There is no bowel obstruction or ileus. Diverticulosis of the sigmoid colon without diverticulitis. Vascular/Lymphatic: There is extensive atherosclerosis of the aorta and its distal branches. No pathologic adenopathy. Reproductive: Uterus is atrophic.  No adnexal masses. Other: Heterogeneous free fluid is seen throughout the abdomen and pelvis. Fluid in the right flank is fairly simple in appearance compatible with bland ascites. Heterogeneous fluid of mixed attenuation within the central abdomen and pelvis extends approximately 16.7 x 10.8 cm in transverse dimension, consistent with intraperitoneal hemorrhage and likely clot. An underlying mesenteric mass would be difficult to exclude delayed imaging through this region does not demonstrate any pooling  of contrast to suggest ongoing hemorrhage. There is no free intra-abdominal gas.  No abdominal wall hernia. Musculoskeletal: No acute displaced fractures. Reconstructed images demonstrate no additional findings. IMPRESSION: 1. Heterogeneous process in the central abdomen and pelvis, likely a large mesenteric hematoma. No contrast extravasation to suggest active hemorrhage. Underlying mass in this region cannot be excluded, and appropriate follow-up will be required. 2. No acute intrathoracic trauma. 3. Nonobstructing right renal calculus. The left kidney is not identified and likely surgically absent. 4. Innumerable hepatic cysts. 5. Large hiatal hernia. 6. 1 cm left lobe thyroid nodule. Not clinically significant; no follow-up imaging recommended. (Ref: J Am Coll Radiol. 2015 Feb;12(2): 143-50). 7.  Aortic Atherosclerosis (ICD10-I70.0). These results were called by telephone at the time of interpretation on 10/30/2019 at 9:48 pm to provider [facility] , who verbally acknowledged these results. Electronically Signed   By: Sharlet Salina M.D.    On: 10/30/2019 21:48   CT Cervical Spine Wo Contrast  Result Date: 10/30/2019 CLINICAL DATA:  85 year old female with trauma. EXAM: CT HEAD WITHOUT CONTRAST CT MAXILLOFACIAL WITHOUT CONTRAST CT CERVICAL SPINE WITHOUT CONTRAST TECHNIQUE: Multidetector CT imaging of the head, cervical spine, and maxillofacial structures were performed using the standard protocol without intravenous contrast. Multiplanar CT image reconstructions of the cervical spine and maxillofacial structures were also generated. COMPARISON:  None. FINDINGS: CT HEAD FINDINGS Brain: Moderate age-related atrophy and chronic microvascular ischemic changes. There is no acute intracranial hemorrhage. No mass effect or midline shift. No extra-axial fluid collection. A 5 mm rounded calcified structure to the right of the anterior falx may represent a small calcified meningioma. Vascular: No hyperdense vessel or unexpected calcification. Skull: Normal. Negative for fracture or focal lesion. Other: None CT MAXILLOFACIAL FINDINGS Osseous: No acute fracture. No mandibular subluxation Orbits: The globes and retro-orbital fat are preserved. Sinuses: Clear. Soft tissues: Negative. CT CERVICAL SPINE FINDINGS Alignment: No acute subluxation. Skull base and vertebrae: No acute fracture. Osteopenia. Soft tissues and spinal canal: No prevertebral fluid or swelling. No visible canal hematoma. Disc levels:  No acute findings. Multilevel degenerative changes. Upper chest: Emphysema. Other: Bilateral carotid bulb calcified plaques. IMPRESSION: 1. No acute intracranial hemorrhage. Age-related atrophy and chronic microvascular ischemic changes. 2. No acute/traumatic cervical spine pathology. 3. No acute facial bone fractures. Electronically Signed   By: Elgie Collard M.D.   On: 10/30/2019 21:38   CT ABDOMEN PELVIS W CONTRAST  Result Date: 10/30/2019 CLINICAL DATA:  [place] yesterday, found down, hematemesis, syncope EXAM: CT CHEST, ABDOMEN, AND PELVIS WITH CONTRAST  TECHNIQUE: Multidetector CT imaging of the chest, abdomen and pelvis was performed following the standard protocol during bolus administration of intravenous contrast. CONTRAST:  OMNIPAQUE IOHEXOL 300 MG/ML  SOLN COMPARISON:  None. FINDINGS: CT CHEST FINDINGS Cardiovascular: Heart and great vessels are unremarkable without pericardial effusion. Significant atherosclerosis of the aorta and coronary vessels. No evidence of thoracic aortic aneurysm or dissection. Mediastinum/Nodes: There is a large hiatal hernia. 1 cm hypodensity left lobe thyroid does not warrant further follow-up based on size and patient age. Trachea and esophagus are unremarkable. No pathologic adenopathy. Lungs/Pleura: No airspace disease, effusion, or pneumothorax. Central airways are patent. Musculoskeletal: No acute displaced fractures. Rotatory scoliosis of the thoracolumbar spine. Reconstructed images demonstrate no additional findings. CT ABDOMEN PELVIS FINDINGS Hepatobiliary: There are innumerable hepatic cysts replacing the majority of the left lobe of the liver. Gallbladder is moderately distended small calcified gallstones. No evidence of cholecystitis. Pancreas: Unremarkable. No pancreatic ductal dilatation or surrounding inflammatory changes. Spleen:  No splenic injury or perisplenic hematoma. Adrenals/Urinary Tract: The left kidney has been removed in the interim. Large extrarenal pelvis right kidney again seen. No evidence of hydronephrosis. Nonobstructing 7 mm calculus lower pole right kidney. The bladder is grossly unremarkable. The adrenals are not identified. Stomach/Bowel: There is a large hiatal hernia with the majority of the stomach in an intrathoracic position. There is no bowel obstruction or ileus. Diverticulosis of the sigmoid colon without diverticulitis. Vascular/Lymphatic: There is extensive atherosclerosis of the aorta and its distal branches. No pathologic adenopathy. Reproductive: Uterus is atrophic.  No  adnexal masses. Other: Heterogeneous free fluid is seen throughout the abdomen and pelvis. Fluid in the right flank is fairly simple in appearance compatible with bland ascites. Heterogeneous fluid of mixed attenuation within the central abdomen and pelvis extends approximately 16.7 x 10.8 cm in transverse dimension, consistent with intraperitoneal hemorrhage and likely clot. An underlying mesenteric mass would be difficult to exclude delayed imaging through this region does not demonstrate any pooling of contrast to suggest ongoing hemorrhage. There is no free intra-abdominal gas.  No abdominal wall hernia. Musculoskeletal: No acute displaced fractures. Reconstructed images demonstrate no additional findings. IMPRESSION: 1. Heterogeneous process in the central abdomen and pelvis, likely a large mesenteric hematoma. No contrast extravasation to suggest active hemorrhage. Underlying mass in this region cannot be excluded, and appropriate follow-up will be required. 2. No acute intrathoracic trauma. 3. Nonobstructing right renal calculus. The left kidney is not identified and likely surgically absent. 4. Innumerable hepatic cysts. 5. Large hiatal hernia. 6. 1 cm left lobe thyroid nodule. Not clinically significant; no follow-up imaging recommended. (Ref: J Am Coll Radiol. 2015 Feb;12(2): 143-50). 7.  Aortic Atherosclerosis (ICD10-I70.0). These results were called by telephone at the time of interpretation on 10/30/2019 at 9:48 pm to provider [facility] , who verbally acknowledged these results. Electronically Signed   By: Sharlet Salina M.D.   On: 10/30/2019 21:48   DG Chest Port 1 View  Result Date: 10/30/2019 CLINICAL DATA:  Syncope EXAM: PORTABLE CHEST 1 VIEW COMPARISON:  04/09/2005 FINDINGS: Single frontal view of the chest demonstrates a stable cardiac silhouette. Atherosclerosis of the thoracic aorta. No airspace disease, effusion, or pneumothorax. Stable rotatory scoliosis of the thoracolumbar spine.  IMPRESSION: No acute intrathoracic process. Electronically Signed   By: Sharlet Salina M.D.   On: 10/30/2019 20:50   CT Maxillofacial Wo Contrast  Result Date: 10/30/2019 CLINICAL DATA:  85 year old female with trauma. EXAM: CT HEAD WITHOUT CONTRAST CT MAXILLOFACIAL WITHOUT CONTRAST CT CERVICAL SPINE WITHOUT CONTRAST TECHNIQUE: Multidetector CT imaging of the head, cervical spine, and maxillofacial structures were performed using the standard protocol without intravenous contrast. Multiplanar CT image reconstructions of the cervical spine and maxillofacial structures were also generated. COMPARISON:  None. FINDINGS: CT HEAD FINDINGS Brain: Moderate age-related atrophy and chronic microvascular ischemic changes. There is no acute intracranial hemorrhage. No mass effect or midline shift. No extra-axial fluid collection. A 5 mm rounded calcified structure to the right of the anterior falx may represent a small calcified meningioma. Vascular: No hyperdense vessel or unexpected calcification. Skull: Normal. Negative for fracture or focal lesion. Other: None CT MAXILLOFACIAL FINDINGS Osseous: No acute fracture. No mandibular subluxation Orbits: The globes and retro-orbital fat are preserved. Sinuses: Clear. Soft tissues: Negative. CT CERVICAL SPINE FINDINGS Alignment: No acute subluxation. Skull base and vertebrae: No acute fracture. Osteopenia. Soft tissues and spinal canal: No prevertebral fluid or swelling. No visible canal hematoma. Disc levels:  No acute findings. Multilevel degenerative changes.  Upper chest: Emphysema. Other: Bilateral carotid bulb calcified plaques. IMPRESSION: 1. No acute intracranial hemorrhage. Age-related atrophy and chronic microvascular ischemic changes. 2. No acute/traumatic cervical spine pathology. 3. No acute facial bone fractures. Electronically Signed   By: Elgie Collard M.D.   On: 10/30/2019 21:38    Procedures Procedures (including critical care time) CRITICAL  CARE Performed by: Wynetta Fines   Total critical care time: 60 minutes  Critical care time was exclusive of separately billable procedures and treating other patients.  Critical care was necessary to treat or prevent imminent or life-threatening deterioration.  Critical care was time spent personally by me on the following activities: development of treatment plan with patient and/or surrogate as well as nursing, discussions with consultants, evaluation of patient's response to treatment, examination of patient, obtaining history from patient or surrogate, ordering and performing treatments and interventions, ordering and review of laboratory studies, ordering and review of radiographic studies, pulse oximetry and re-evaluation of patient's condition.   Medications Ordered in ED Medications  metroNIDAZOLE (FLAGYL) IVPB 500 mg (has no administration in time range)  ciprofloxacin (CIPRO) IVPB 400 mg (has no administration in time range)  sodium chloride 0.9 % bolus 500 mL (0 mLs Intravenous Stopped 10/30/19 2122)  ondansetron (ZOFRAN) injection 4 mg (4 mg Intravenous Given 10/30/19 1955)  pantoprazole (PROTONIX) injection 40 mg (40 mg Intravenous Given 10/30/19 1955)  sodium chloride 0.9 % bolus 500 mL (500 mLs Intravenous New Bag/Given 10/30/19 2122)  iohexol (OMNIPAQUE) 300 MG/ML solution 100 mL (100 mLs Intravenous Contrast Given 10/30/19 2112)    ED Course  I have reviewed the triage vital signs and the nursing notes.  Pertinent labs & imaging results that were available during my care of the patient were reviewed by me and considered in my medical decision making (see chart for details).    MDM Rules/Calculators/A&P                      MDM  Screen complete  JONEL SICK was evaluated in Emergency Department on 10/30/2019 for the symptoms described in the history of present illness. She was evaluated in the context of the global COVID-19 pandemic, which necessitated consideration  that the patient might be at risk for infection with the SARS-CoV-2 virus that causes COVID-19. Institutional protocols and algorithms that pertain to the evaluation of patients at risk for COVID-19 are in a state of rapid change based on information released by regulatory bodies including the CDC and federal and state organizations. These policies and algorithms were followed during the patient's care in the ED.  Patient is presenting for evaluation following an unwitnessed fall at home.  Patient's presentation is concerning given the reported length of time that she was on the floor. She additionally was complaining of diffuse lower abdominal pain and associated vomiting.  Work-up demonstrates evidence of dehydration with a elevated lactic acid.    CT imaging reveals large intraperitoneal possible hemorrhage.  There is no evidence of active extravasation.  Patient appears to be improved following her initial ED evaluation.  Her blood pressure is elevated.  Her heart rate is improved with hydration.    Patient's left kidney was not clearly visualized on CT imaging.  Patient is emphatically reporting that she has not had prior surgery on the left kidney or removal of the left kidney.  Presentation is concerning for possible pathology in the left kidney with resulting hemorrhage.  Case discussed with urology Dr. Alvester Morin who will evaluate  the patient.  Surgery is aware of case.  Dr. Doylene Canardonner will evaluate the patient.  Imaging reviewed with Dr. Doylene Canardonner.  She does not feel that the patient requires acute surgical intervention.  Patient requires admission for further work-up and treatment.  Hospitalist service aware of case and will evaluate for same.   Final Clinical Impression(s) / ED Diagnoses Final diagnoses:  Syncope and collapse  Intraabdominal hemorrhage    Rx / DC Orders ED Discharge Orders    None       Wynetta FinesMessick, Edvin Albus C, MD 10/30/19 2253

## 2019-10-31 ENCOUNTER — Encounter (HOSPITAL_COMMUNITY): Payer: Self-pay | Admitting: Internal Medicine

## 2019-10-31 ENCOUNTER — Inpatient Hospital Stay (HOSPITAL_COMMUNITY): Payer: Medicare Other

## 2019-10-31 DIAGNOSIS — R58 Hemorrhage, not elsewhere classified: Secondary | ICD-10-CM

## 2019-10-31 DIAGNOSIS — R55 Syncope and collapse: Secondary | ICD-10-CM

## 2019-10-31 DIAGNOSIS — S36892D Contusion of other intra-abdominal organs, subsequent encounter: Secondary | ICD-10-CM

## 2019-10-31 DIAGNOSIS — S36892A Contusion of other intra-abdominal organs, initial encounter: Principal | ICD-10-CM

## 2019-10-31 LAB — URINALYSIS, ROUTINE W REFLEX MICROSCOPIC
Bilirubin Urine: NEGATIVE
Glucose, UA: NEGATIVE mg/dL
Ketones, ur: NEGATIVE mg/dL
Nitrite: NEGATIVE
Protein, ur: NEGATIVE mg/dL
RBC / HPF: >50 RBC/hpf — ABNORMAL HIGH (ref 0–5)
Specific Gravity, Urine: >1.046 — ABNORMAL HIGH (ref 1.005–1.030)
pH: 5 (ref 5.0–8.0)

## 2019-10-31 LAB — CBC
HCT: 25.4 % — ABNORMAL LOW (ref 36.0–46.0)
HCT: 23.4 % — ABNORMAL LOW (ref 36.0–46.0)
HCT: 23.7 % — ABNORMAL LOW (ref 36.0–46.0)
HCT: 24.3 % — ABNORMAL LOW (ref 36.0–46.0)
Hemoglobin: 8.4 g/dL — ABNORMAL LOW (ref 12.0–15.0)
Hemoglobin: 7.9 g/dL — ABNORMAL LOW (ref 12.0–15.0)
Hemoglobin: 7.8 g/dL — ABNORMAL LOW (ref 12.0–15.0)
Hemoglobin: 7.9 g/dL — ABNORMAL LOW (ref 12.0–15.0)
MCH: 31.7 pg (ref 26.0–34.0)
MCH: 32.2 pg (ref 26.0–34.0)
MCH: 32 pg (ref 26.0–34.0)
MCH: 31.7 pg (ref 26.0–34.0)
MCHC: 33.1 g/dL (ref 30.0–36.0)
MCHC: 33.8 g/dL (ref 30.0–36.0)
MCHC: 32.9 g/dL (ref 30.0–36.0)
MCHC: 32.5 g/dL (ref 30.0–36.0)
MCV: 95.8 fL (ref 80.0–100.0)
MCV: 95.5 fL (ref 80.0–100.0)
MCV: 97.1 fL (ref 80.0–100.0)
MCV: 97.6 fL (ref 80.0–100.0)
Platelets: 178 10*3/uL (ref 150–400)
Platelets: 153 10*3/uL (ref 150–400)
Platelets: 154 10*3/uL (ref 150–400)
Platelets: 178 10*3/uL (ref 150–400)
RBC: 2.65 MIL/uL — ABNORMAL LOW (ref 3.87–5.11)
RBC: 2.45 MIL/uL — ABNORMAL LOW (ref 3.87–5.11)
RBC: 2.44 MIL/uL — ABNORMAL LOW (ref 3.87–5.11)
RBC: 2.49 MIL/uL — ABNORMAL LOW (ref 3.87–5.11)
RDW: 13.9 % (ref 11.5–15.5)
RDW: 14.1 % (ref 11.5–15.5)
RDW: 14.3 % (ref 11.5–15.5)
RDW: 14.3 % (ref 11.5–15.5)
WBC: 14.6 10*3/uL — ABNORMAL HIGH (ref 4.0–10.5)
WBC: 13.9 10*3/uL — ABNORMAL HIGH (ref 4.0–10.5)
WBC: 13.4 10*3/uL — ABNORMAL HIGH (ref 4.0–10.5)
WBC: 13.7 10*3/uL — ABNORMAL HIGH (ref 4.0–10.5)
nRBC: 0 % (ref 0.0–0.2)
nRBC: 0 % (ref 0.0–0.2)
nRBC: 0 % (ref 0.0–0.2)
nRBC: 0 % (ref 0.0–0.2)

## 2019-10-31 LAB — RETICULOCYTES
Immature Retic Fract: 18.1 % — ABNORMAL HIGH (ref 2.3–15.9)
RBC.: 2.55 MIL/uL — ABNORMAL LOW (ref 3.87–5.11)
Retic Count, Absolute: 55.8 10*3/uL (ref 19.0–186.0)
Retic Ct Pct: 2.2 % (ref 0.4–3.1)

## 2019-10-31 LAB — VITAMIN B12: Vitamin B-12: 4365 pg/mL — ABNORMAL HIGH (ref 180–914)

## 2019-10-31 LAB — BASIC METABOLIC PANEL
Anion gap: 10 (ref 5–15)
BUN: 26 mg/dL — ABNORMAL HIGH (ref 8–23)
CO2: 21 mmol/L — ABNORMAL LOW (ref 22–32)
Calcium: 8.7 mg/dL — ABNORMAL LOW (ref 8.9–10.3)
Chloride: 109 mmol/L (ref 98–111)
Creatinine, Ser: 0.99 mg/dL (ref 0.44–1.00)
GFR calc Af Amer: >60 mL/min (ref >60)
GFR calc non Af Amer: 53 mL/min — ABNORMAL LOW (ref >60)
Glucose, Bld: 112 mg/dL — ABNORMAL HIGH (ref 70–99)
Potassium: 4.2 mmol/L (ref 3.5–5.1)
Sodium: 140 mmol/L (ref 135–145)

## 2019-10-31 LAB — IRON AND TIBC
Iron: 27 ug/dL — ABNORMAL LOW (ref 28–170)
Saturation Ratios: 12 % (ref 10.4–31.8)
TIBC: 232 ug/dL — ABNORMAL LOW (ref 250–450)
UIBC: 205 ug/dL

## 2019-10-31 LAB — FOLATE: Folate: 9.2 ng/mL (ref >5.9)

## 2019-10-31 LAB — ECHOCARDIOGRAM COMPLETE
Height: 64 in
Weight: 1604.95 oz

## 2019-10-31 LAB — FERRITIN: Ferritin: 105 ng/mL (ref 11–307)

## 2019-10-31 LAB — CK: Total CK: 1209 U/L — ABNORMAL HIGH (ref 38–234)

## 2019-10-31 MED ORDER — DOCUSATE SODIUM 100 MG PO CAPS
100.0000 mg | ORAL_CAPSULE | Freq: Two times a day (BID) | ORAL | Status: DC
  Administered 2019-10-31 – 2019-11-03 (×6): 100 mg via ORAL
  Filled 2019-10-31 (×6): qty 1

## 2019-10-31 MED ORDER — ONDANSETRON HCL 4 MG PO TABS
4.0000 mg | ORAL_TABLET | Freq: Four times a day (QID) | ORAL | Status: DC | PRN
  Administered 2019-11-03: 4 mg via ORAL
  Filled 2019-10-31: qty 1

## 2019-10-31 MED ORDER — SODIUM CHLORIDE 0.9 % IV SOLN: INTRAVENOUS | Status: DC

## 2019-10-31 MED ORDER — LATANOPROST 0.005 % OP SOLN
1.0000 [drp] | Freq: Every day | OPHTHALMIC | Status: DC
  Administered 2019-10-31 – 2019-11-02 (×3): 1 [drp] via OPHTHALMIC
  Filled 2019-10-31: qty 2.5

## 2019-10-31 MED ORDER — TIMOLOL MALEATE 0.25 % OP SOLN
1.0000 [drp] | Freq: Every day | OPHTHALMIC | Status: DC
  Administered 2019-10-31 – 2019-11-03 (×4): 1 [drp] via OPHTHALMIC
  Filled 2019-10-31: qty 5

## 2019-10-31 MED ORDER — POLYETHYLENE GLYCOL 3350 17 G PO PACK: 17.0000 g | PACK | Freq: Every day | ORAL | Status: DC | PRN

## 2019-10-31 MED ORDER — ACETAMINOPHEN 325 MG PO TABS
650.0000 mg | ORAL_TABLET | Freq: Four times a day (QID) | ORAL | Status: DC | PRN
  Administered 2019-10-31 – 2019-11-02 (×6): 650 mg via ORAL
  Filled 2019-10-31 (×6): qty 2

## 2019-10-31 MED ORDER — ONDANSETRON HCL 4 MG/2ML IJ SOLN: 4.0000 mg | Freq: Four times a day (QID) | INTRAMUSCULAR | Status: DC | PRN

## 2019-10-31 NOTE — Consult Note (Signed)
H&P Physician requesting consult: Midge Minium  Chief Complaint: Intra-abdominal hematoma  History of Present Illness: 85 year old female found down in her home after several hours after being pulled down by a dog.  In the emergency department, she underwent pan CT scan.  This revealed a large heterogeneous fluid of mixed attenuation in the central abdomen and pelvis approximately 16.7 x 10.8 cm consistent with intraperitoneal hemorrhage with clot.  There was no active extravasation that was obvious.  She also had some simple fluid in the right flank.  There is no visible left renal parenchyma.  Patient has a history of bilateral UPJ obstruction.  In 2006, she underwent cystoscopy with ureteral stent placement by Dr. Retta Diones.  She subsequent underwent a renal scan that revealed 14% function on the left.  Stent was eventually removed and Dr. Retta Diones discussed simple nephrectomy with the patient but she did not follow-up after this.  She denies having any abdominal surgery.  She has no obvious abdominal scars on exam.  No evidence that she has ever had a nephrectomy on the left.  Renal function is normal and is actually improving.  She is tachycardic but otherwise afebrile and vital signs are stable.  No hypotension.  Hemoglobin is slowly drifting down.  Hemoglobin was 9.8 on presentation around 7 PM last night.  Most recent hemoglobin is 7.9.  It is slowly trending down but not rapidly.  History reviewed. No pertinent past medical history. History reviewed. No pertinent surgical history.  Home Medications:  Medications Prior to Admission  Medication Sig Dispense Refill Last Dose  . bimatoprost (LUMIGAN) 0.01 % SOLN Place 1 drop into both eyes at bedtime.    10/29/2019 at Unknown time  . timolol (TIMOPTIC) 0.25 % ophthalmic solution Place 1 drop into both eyes in the morning and at bedtime.   10/30/2019 at Unknown time   Allergies:  Allergies  Allergen Reactions  . Clarithromycin Hives  .  Penicillin G Benzathine Hives  . Sulfamethoxazole-Trimethoprim Hives    Family History  Problem Relation Age of Onset  . Colon cancer Mother    Social History:  reports that she has never smoked. She has never used smokeless tobacco. She reports that she does not drink alcohol or use drugs.  ROS: A complete review of systems was performed.  All systems are negative except for pertinent findings as noted. ROS   Physical Exam:  Vital signs in last 24 hours: Temp:  [97.7 F (36.5 C)-99 F (37.2 C)] 98.9 F (37.2 C) (05/05 0640) Pulse Rate:  [100-110] 105 (05/05 0641) Resp:  [17-23] 20 (05/05 0640) BP: (104-160)/(55-120) 128/64 (05/05 0640) SpO2:  [91 %-100 %] 95 % (05/05 0641) Weight:  [45.5 kg] 45.5 kg (05/05 0252) General:  Alert and oriented, No acute distress HEENT: Normocephalic, atraumatic Neck: No JVD or lymphadenopathy Cardiovascular: Tachycardic Lungs: Regular rate and effort Abdomen: Soft, mildly tender in the left lower quadrant, nondistended, no abdominal masses Back: No CVA tenderness Extremities: No edema Neurologic: Grossly intact  Laboratory Data:  Results for orders placed or performed during the hospital encounter of 10/30/19 (from the past 24 hour(s))  Comprehensive metabolic panel     Status: Abnormal   Collection Time: 10/30/19  7:19 PM  Result Value Ref Range   Sodium 142 135 - 145 mmol/L   Potassium 4.1 3.5 - 5.1 mmol/L   Chloride 109 98 - 111 mmol/L   CO2 17 (L) 22 - 32 mmol/L   Glucose, Bld 137 (H) 70 - 99 mg/dL  BUN 30 (H) 8 - 23 mg/dL   Creatinine, Ser 9.141.34 (H) 0.44 - 1.00 mg/dL   Calcium 9.0 8.9 - 78.210.3 mg/dL   Total Protein 5.6 (L) 6.5 - 8.1 g/dL   Albumin 3.0 (L) 3.5 - 5.0 g/dL   AST 47 (H) 15 - 41 U/L   ALT 21 0 - 44 U/L   Alkaline Phosphatase 47 38 - 126 U/L   Total Bilirubin 1.3 (H) 0.3 - 1.2 mg/dL   GFR calc non Af Amer 37 (L) >60 mL/min   GFR calc Af Amer 43 (L) >60 mL/min   Anion gap 16 (H) 5 - 15  Ethanol     Status: None    Collection Time: 10/30/19  7:19 PM  Result Value Ref Range   Alcohol, Ethyl (B) <10 <10 mg/dL  Troponin I (High Sensitivity)     Status: Abnormal   Collection Time: 10/30/19  7:19 PM  Result Value Ref Range   Troponin I (High Sensitivity) 34 (H) <18 ng/L  Lactic acid, plasma     Status: Abnormal   Collection Time: 10/30/19  7:19 PM  Result Value Ref Range   Lactic Acid, Venous 3.2 (HH) 0.5 - 1.9 mmol/L  CBC with Differential     Status: Abnormal   Collection Time: 10/30/19  7:19 PM  Result Value Ref Range   WBC 18.2 (H) 4.0 - 10.5 K/uL   RBC 3.05 (L) 3.87 - 5.11 MIL/uL   Hemoglobin 9.8 (L) 12.0 - 15.0 g/dL   HCT 95.629.2 (L) 21.336.0 - 08.646.0 %   MCV 95.7 80.0 - 100.0 fL   MCH 32.1 26.0 - 34.0 pg   MCHC 33.6 30.0 - 36.0 g/dL   RDW 57.813.8 46.911.5 - 62.915.5 %   Platelets 233 150 - 400 K/uL   nRBC 0.0 0.0 - 0.2 %   Neutrophils Relative % 87 %   Neutro Abs 16.0 (H) 1.7 - 7.7 K/uL   Lymphocytes Relative 6 %   Lymphs Abs 1.0 0.7 - 4.0 K/uL   Monocytes Relative 6 %   Monocytes Absolute 1.1 (H) 0.1 - 1.0 K/uL   Eosinophils Relative 0 %   Eosinophils Absolute 0.0 0.0 - 0.5 K/uL   Basophils Relative 0 %   Basophils Absolute 0.0 0.0 - 0.1 K/uL   Immature Granulocytes 1 %   Abs Immature Granulocytes 0.11 (H) 0.00 - 0.07 K/uL  Protime-INR     Status: None   Collection Time: 10/30/19  7:19 PM  Result Value Ref Range   Prothrombin Time 13.0 11.4 - 15.2 seconds   INR 1.0 0.8 - 1.2  Lipase, blood     Status: None   Collection Time: 10/30/19  7:19 PM  Result Value Ref Range   Lipase 18 11 - 51 U/L  Type and screen [facility]     Status: None   Collection Time: 10/30/19  7:35 PM  Result Value Ref Range   ABO/RH(D) O POS    Antibody Screen NEG    Sample Expiration      11/02/2019,2359 Performed at [facility], [address] [place], [place] [address]   ABO/Rh     Status: None   Collection Time: 10/30/19  7:35 PM  Result Value Ref Range   ABO/RH(D)      O POS Performed  at [facility], [address] [place], [place] [address]   I-stat chem 8, ED (not at [facility] or [facility])  Status: Abnormal   Collection Time: 10/30/19  7:42 PM  Result Value Ref Range   Sodium 142 135 - 145 mmol/L   Potassium 3.9 3.5 - 5.1 mmol/L   Chloride 110 98 - 111 mmol/L   BUN 28 (H) 8 - 23 mg/dL   Creatinine, Ser 5.09 0.44 - 1.00 mg/dL   Glucose, Bld 326 (H) 70 - 99 mg/dL   Calcium, Ion 7.12 (L) 1.15 - 1.40 mmol/L   TCO2 20 (L) 22 - 32 mmol/L   Hemoglobin 9.2 (L) 12.0 - 15.0 g/dL   HCT 45.8 (L) 09.9 - 83.3 %  POC occult blood, ED     Status: None   Collection Time: 10/30/19  8:18 PM  Result Value Ref Range   Fecal Occult Bld NEGATIVE NEGATIVE  Lactic acid, plasma     Status: None   Collection Time: 10/30/19  9:19 PM  Result Value Ref Range   Lactic Acid, Venous 1.6 0.5 - 1.9 mmol/L  Troponin I (High Sensitivity)     Status: Abnormal   Collection Time: 10/30/19  9:19 PM  Result Value Ref Range   Troponin I (High Sensitivity) 48 (H) <18 ng/L  Respiratory Panel by RT PCR (Flu A&B, Covid) - Nasopharyngeal Swab     Status: None   Collection Time: 10/30/19 10:14 PM   Specimen: Nasopharyngeal Swab  Result Value Ref Range   SARS Coronavirus 2 by RT PCR NEGATIVE NEGATIVE   Influenza A by PCR NEGATIVE NEGATIVE   Influenza B by PCR NEGATIVE NEGATIVE  Basic metabolic panel     Status: Abnormal   Collection Time: 10/31/19  3:35 AM  Result Value Ref Range   Sodium 140 135 - 145 mmol/L   Potassium 4.2 3.5 - 5.1 mmol/L   Chloride 109 98 - 111 mmol/L   CO2 21 (L) 22 - 32 mmol/L   Glucose, Bld 112 (H) 70 - 99 mg/dL   BUN 26 (H) 8 - 23 mg/dL   Creatinine, Ser 8.25 0.44 - 1.00 mg/dL   Calcium 8.7 (L) 8.9 - 10.3 mg/dL   GFR calc non Af Amer 53 (L) >60 mL/min   GFR calc Af Amer >60 >60 mL/min   Anion gap 10 5 - 15  CBC     Status: Abnormal   Collection Time: 10/31/19  3:35 AM  Result Value Ref Range   WBC 14.6 (H) 4.0 - 10.5 K/uL   RBC 2.65 (L) 3.87 - 5.11 MIL/uL    Hemoglobin 8.4 (L) 12.0 - 15.0 g/dL   HCT 05.3 (L) 97.6 - 73.4 %   MCV 95.8 80.0 - 100.0 fL   MCH 31.7 26.0 - 34.0 pg   MCHC 33.1 30.0 - 36.0 g/dL   RDW 19.3 79.0 - 24.0 %   Platelets 178 150 - 400 K/uL   nRBC 0.0 0.0 - 0.2 %  CK     Status: Abnormal   Collection Time: 10/31/19  3:35 AM  Result Value Ref Range   Total CK 1,209 (H) 38 - 234 U/L  Vitamin B12     Status: Abnormal   Collection Time: 10/31/19  3:35 AM  Result Value Ref Range   Vitamin B-12 4,365 (H) 180 - 914 pg/mL  Folate     Status: None   Collection Time: 10/31/19  3:35 AM  Result Value Ref Range   Folate 9.2 >5.9 ng/mL  Iron and TIBC     Status: Abnormal   Collection Time: 10/31/19  3:35 AM  Result  Value Ref Range   Iron 27 (L) 28 - 170 ug/dL   TIBC 778 (L) 242 - 353 ug/dL   Saturation Ratios 12 10.4 - 31.8 %   UIBC 205 ug/dL  Ferritin     Status: None   Collection Time: 10/31/19  3:35 AM  Result Value Ref Range   Ferritin 105 11 - 307 ng/mL  Reticulocytes     Status: Abnormal   Collection Time: 10/31/19  3:35 AM  Result Value Ref Range   Retic Ct Pct 2.2 0.4 - 3.1 %   RBC. 2.55 (L) 3.87 - 5.11 MIL/uL   Retic Count, Absolute 55.8 19.0 - 186.0 K/uL   Immature Retic Fract 18.1 (H) 2.3 - 15.9 %  CBC     Status: Abnormal   Collection Time: 10/31/19  6:45 AM  Result Value Ref Range   WBC 13.9 (H) 4.0 - 10.5 K/uL   RBC 2.45 (L) 3.87 - 5.11 MIL/uL   Hemoglobin 7.9 (L) 12.0 - 15.0 g/dL   HCT 61.4 (L) 43.1 - 54.0 %   MCV 95.5 80.0 - 100.0 fL   MCH 32.2 26.0 - 34.0 pg   MCHC 33.8 30.0 - 36.0 g/dL   RDW 08.6 76.1 - 95.0 %   Platelets 153 150 - 400 K/uL   nRBC 0.0 0.0 - 0.2 %   Recent Results (from the past 240 hour(s))  Respiratory Panel by RT PCR (Flu A&B, Covid) - Nasopharyngeal Swab     Status: None   Collection Time: 10/30/19 10:14 PM   Specimen: Nasopharyngeal Swab  Result Value Ref Range Status   SARS Coronavirus 2 by RT PCR NEGATIVE NEGATIVE Final    Comment: (NOTE) SARS-CoV-2 target nucleic  acids are NOT DETECTED. The SARS-CoV-2 RNA is generally detectable in upper respiratoy specimens during the acute phase of infection. The lowest concentration of SARS-CoV-2 viral copies this assay can detect is 131 copies/mL. A negative result does not preclude SARS-Cov-2 infection and should not be used as the sole basis for treatment or other patient management decisions. A negative result may occur with  improper specimen collection/handling, submission of specimen other than nasopharyngeal swab, presence of viral mutation(s) within the areas targeted by this assay, and inadequate number of viral copies (<131 copies/mL). A negative result must be combined with clinical observations, patient history, and epidemiological information. The expected result is Negative. Fact Sheet for Patients:  https://www.moore.com/ Fact Sheet for Healthcare Providers:  https://www.young.biz/ This test is not yet ap proved or cleared by the [place] FDA and  has been authorized for detection and/or diagnosis of SARS-CoV-2 by FDA under an Emergency Use Authorization (EUA). This EUA will remain  in effect (meaning this test can be used) for the duration of the COVID-19 declaration under Section 564(b)(1) of the Act, 21 U.S.C. section 360bbb-3(b)(1), unless the authorization is terminated or revoked sooner.    Influenza A by PCR NEGATIVE NEGATIVE Final   Influenza B by PCR NEGATIVE NEGATIVE Final    Comment: (NOTE) The Xpert Xpress SARS-CoV-2/FLU/RSV assay is intended as an aid in  the diagnosis of influenza from Nasopharyngeal swab specimens and  should not be used as a sole basis for treatment. Nasal washings and  aspirates are unacceptable for Xpert Xpress SARS-CoV-2/FLU/RSV  testing. Fact Sheet for Patients: https://www.moore.com/ Fact Sheet for Healthcare Providers: https://www.young.biz/ This test is not yet  approved or cleared by the [place] FDA and  has been authorized for detection and/or diagnosis of SARS-CoV-2 by  FDA  under an Emergency Use Authorization (EUA). This EUA will remain  in effect (meaning this test can be used) for the duration of the  Covid-19 declaration under Section 564(b)(1) of the Act, 21  U.S.C. section 360bbb-3(b)(1), unless the authorization is  terminated or revoked. Performed at [facility], [place] [address] [place], [place] [address]    Creatinine: Recent Labs    10/30/19 1919 10/30/19 1942 10/31/19 0335  CREATININE 1.34* 1.00 0.99   CT scan personally reviewed and is detailed in history of present illness  Impression/Assessment:  Likely perinephric hematoma with possible ureteropelvic junction injury Ureteropelvic junction obstruction  Plan:  In reviewing her CT scan and comparing it to 2006, I think most likely she has a nonfunctional left kidney at this point but had a persistent large left ureteropelvic junction obstruction and now has a hematoma from possible renal injury from the remaining tissue versus disruption of the ureteropelvic junction.  I do not think this is an abdominal mass or malignancy.  Free fluid on the right may represent urine.  However, her creatinine is normal and I would expect her creatinine to increase if she has intra-abdominal urine.  Given that her creatinine is normal, I would like to treat this as a perinephric hematoma/renal injury with bedrest and serial hemoglobin.  Transfuse as needed.  If she becomes unstable, recommend interventional radiology evaluation.  In terms of the ureteropelvic junction, I do not think she needs a drain at this point.  However, if she starts to develop signs of infection or severe pain, it may be appropriate to place a percutaneous drain.  Marton Redwood, III 10/31/2019, 8:18 AM

## 2019-10-31 NOTE — H&P (Signed)
History and Physical    Jacqueline Mathews:[id number] DOB: 09-Apr-1935 DOA: 10/30/2019  PCP: Marletta Lor, MD  Patient coming from: Home.  Chief Complaint: Fall.  HPI: Jacqueline Mathews is a 85 y.o. female with no significant past medical history had a fall at home and was lying on the floor for 18 hours when patient's daughter came to check and was found on the floor and was brought to the ER.  Patient states she does not recall why she fell but likely loss of consciousness as per the patient.  She regained consciousness soon but was unable to get up.  Denies any chest pain or shortness of breath.  Denies any nausea vomiting or diarrhea.  Since the fall patient has been having body ache all over.  ED Course: In the ER patient had CT head C-spine and maxillofacial chest and abdomen done which shows large mesenteric hematoma for which trauma surgeon was consulted.  Requested hospital admission.  Since there is also absence of left kidney Dr. Gloriann Loan of urology was consulted.  EKG showed normal sinus rhythm.  Creatinine is 1.3 Covid test negative hemoglobin 9.8 WBC 18.2 lactic acid 3.2 which improved with fluids to 1.6.  Patient admitted for intra-abdominal hematoma with absent kidney and syncope.  Review of Systems: As per HPI, rest all negative.   History reviewed. No pertinent past medical history.  History reviewed. No pertinent surgical history.   reports that she has never smoked. She has never used smokeless tobacco. She reports that she does not drink alcohol or use drugs.  Allergies  Allergen Reactions  . Clarithromycin Hives  . Penicillin G Benzathine Hives  . Sulfamethoxazole-Trimethoprim Hives    Family History  Problem Relation Age of Onset  . Colon cancer Mother     Prior to Admission medications   Medication Sig Start Date End Date Taking? Authorizing Provider  bimatoprost (LUMIGAN) 0.01 % SOLN Place 1 drop into both eyes at bedtime.    Yes [provider]  timolol (TIMOPTIC) 0.25 % ophthalmic solution Place 1 drop into both eyes in the morning and at bedtime. 10/19/19  Yes [provider]    Physical Exam: Constitutional: Moderately built and nourished. Vitals:   10/31/19 0100 10/31/19 0145 10/31/19 0200 10/31/19 0252  BP: (!) 104/59  (!) 121/55 105/85  Pulse: 100 100 (!) 101 (!) 109  Resp: 17 18  20   Temp:    99 F (37.2 C)  TempSrc:    Oral  SpO2: 95% 97% 98% 100%  Weight:    45.5 kg  Height:    5\' 4"  (1.626 m)   Eyes: Anicteric no pallor. ENMT: No discharge from the ears eyes nose or mouth. Neck: No mass felt.  No neck rigidity. Respiratory: No rhonchi or crepitations. Cardiovascular: S1-S2 heard. Abdomen: Soft nontender bowel sounds present. Musculoskeletal: No edema. Skin: No rash. Neurologic: Alert awake oriented time place and person.  Moves all extremities. Psychiatric: Appears normal but normal affect.   Labs on Admission: I have personally reviewed following labs and imaging studies  CBC: Recent Labs  Lab 10/30/19 1919 10/30/19 1942 10/31/19 0335  WBC 18.2*  --  14.6*  NEUTROABS 16.0*  --   --   HGB 9.8* 9.2* 8.4*  HCT 29.2* 27.0* 25.4*  MCV 95.7  --  95.8  PLT 233  --  601   Basic Metabolic Panel: Recent Labs  Lab 10/30/19 1919 10/30/19 1942 10/31/19 0335  NA 142 142 140  K 4.1 3.9 4.2  CL 109 110 109  CO2 17*  --  21*  GLUCOSE 137* 132* 112*  BUN 30* 28* 26*  CREATININE 1.34* 1.00 0.99  CALCIUM 9.0  --  8.7*   GFR: Estimated Creatinine Clearance: 31.5 mL/min (by C-G formula based on SCr of 0.99 mg/dL). Liver Function Tests: Recent Labs  Lab 10/30/19 1919  AST 47*  ALT 21  ALKPHOS 47  BILITOT 1.3*  PROT 5.6*  ALBUMIN 3.0*   Recent Labs  Lab 10/30/19 1919  LIPASE 18   No results for input(s): AMMONIA in the last 168 hours. Coagulation Profile: Recent Labs  Lab 10/30/19 1919  INR 1.0   Cardiac Enzymes: Recent Labs  Lab 10/31/19 0335  CKTOTAL 1,209*    BNP (last 3 results) No results for input(s): PROBNP in the last 8760 hours. HbA1C: No results for input(s): HGBA1C in the last 72 hours. CBG: No results for input(s): GLUCAP in the last 168 hours. Lipid Profile: No results for input(s): CHOL, HDL, LDLCALC, TRIG, CHOLHDL, LDLDIRECT in the last 72 hours. Thyroid Function Tests: No results for input(s): TSH, T4TOTAL, FREET4, T3FREE, THYROIDAB in the last 72 hours. Anemia Panel: Recent Labs    10/31/19 0335  VITAMINB12 4,365*  FOLATE 9.2  FERRITIN 105  TIBC 232*  IRON 27*  RETICCTPCT 2.2   Urine analysis: No results found for: COLORURINE, APPEARANCEUR, LABSPEC, PHURINE, GLUCOSEU, HGBUR, BILIRUBINUR, KETONESUR, PROTEINUR, UROBILINOGEN, NITRITE, LEUKOCYTESUR Sepsis Labs: @LABRCNTIP (procalcitonin:4,lacticidven:4) ) Recent Results (from the past 240 hour(s))  Respiratory Panel by RT PCR (Flu A&B, Covid) - Nasopharyngeal Swab     Status: None   Collection Time: 10/30/19 10:14 PM   Specimen: Nasopharyngeal Swab  Result Value Ref Range Status   SARS Coronavirus 2 by RT PCR NEGATIVE NEGATIVE Final    Comment: (NOTE) SARS-CoV-2 target nucleic acids are NOT DETECTED. The SARS-CoV-2 RNA is generally detectable in upper respiratoy specimens during the acute phase of infection. The lowest concentration of SARS-CoV-2 viral copies this assay can detect is 131 copies/mL. A negative result does not preclude SARS-Cov-2 infection and should not be used as the sole basis for treatment or other patient management decisions. A negative result may occur with  improper specimen collection/handling, submission of specimen other than nasopharyngeal swab, presence of viral mutation(s) within the areas targeted by this assay, and inadequate number of viral copies (<131 copies/mL). A negative result must be combined with clinical observations, patient history, and epidemiological information. The expected result is Negative. Fact Sheet for  Patients:  12/30/19 Fact Sheet for Healthcare Providers:  https://www.moore.com/ This test is not yet ap proved or cleared by the https://www.young.biz/ FDA and  has been authorized for detection and/or diagnosis of SARS-CoV-2 by FDA under an Emergency Use Authorization (EUA). This EUA will remain  in effect (meaning this test can be used) for the duration of the COVID-19 declaration under Section 564(b)(1) of the Act, 21 U.S.C. section 360bbb-3(b)(1), unless the authorization is terminated or revoked sooner.    Influenza A by PCR NEGATIVE NEGATIVE Final   Influenza B by PCR NEGATIVE NEGATIVE Final    Comment: (NOTE) The Xpert Xpress SARS-CoV-2/FLU/RSV assay is intended as an aid in  the diagnosis of influenza from Nasopharyngeal swab specimens and  should not be used as a sole basis for treatment. Nasal washings and  aspirates are unacceptable for Xpert Xpress SARS-CoV-2/FLU/RSV  testing. Fact Sheet for Patients: [place] Fact Sheet for Healthcare Providers: https://www.moore.com/ This test is not yet approved or  cleared by the [place] FDA and  has been authorized for detection and/or diagnosis of SARS-CoV-2 by  FDA under an Emergency Use Authorization (EUA). This EUA will remain  in effect (meaning this test can be used) for the duration of the  Covid-19 declaration under Section 564(b)(1) of the Act, 21  U.S.C. section 360bbb-3(b)(1), unless the authorization is  terminated or revoked. Performed at [facility], [address] [place], [place] [address]      Radiological Exams on Admission: DG Pelvis 1-2 Views  Result Date: 10/30/2019 CLINICAL DATA:  Syncope EXAM: PELVIS - 1-2 VIEW COMPARISON:  None. FINDINGS: Supine frontal view of the pelvis excludes portions of the proximal left femur. No acute displaced fracture. Symmetrical bilateral hip osteoarthritis. Left convex  scoliosis of the lumbar spine. Sacroiliac joints are normal. IMPRESSION: 1. No acute displaced fracture. Electronically Signed   By: Sharlet SalinaMichael  Brown M.D.   On: 10/30/2019 20:51   CT Head Wo Contrast  Result Date: 10/30/2019 CLINICAL DATA:  85 year old female with trauma. EXAM: CT HEAD WITHOUT CONTRAST CT MAXILLOFACIAL WITHOUT CONTRAST CT CERVICAL SPINE WITHOUT CONTRAST TECHNIQUE: Multidetector CT imaging of the head, cervical spine, and maxillofacial structures were performed using the standard protocol without intravenous contrast. Multiplanar CT image reconstructions of the cervical spine and maxillofacial structures were also generated. COMPARISON:  None. FINDINGS: CT HEAD FINDINGS Brain: Moderate age-related atrophy and chronic microvascular ischemic changes. There is no acute intracranial hemorrhage. No mass effect or midline shift. No extra-axial fluid collection. A 5 mm rounded calcified structure to the right of the anterior falx may represent a small calcified meningioma. Vascular: No hyperdense vessel or unexpected calcification. Skull: Normal. Negative for fracture or focal lesion. Other: None CT MAXILLOFACIAL FINDINGS Osseous: No acute fracture. No mandibular subluxation Orbits: The globes and retro-orbital fat are preserved. Sinuses: Clear. Soft tissues: Negative. CT CERVICAL SPINE FINDINGS Alignment: No acute subluxation. Skull base and vertebrae: No acute fracture. Osteopenia. Soft tissues and spinal canal: No prevertebral fluid or swelling. No visible canal hematoma. Disc levels:  No acute findings. Multilevel degenerative changes. Upper chest: Emphysema. Other: Bilateral carotid bulb calcified plaques. IMPRESSION: 1. No acute intracranial hemorrhage. Age-related atrophy and chronic microvascular ischemic changes. 2. No acute/traumatic cervical spine pathology. 3. No acute facial bone fractures. Electronically Signed   By: Elgie CollardArash  Radparvar M.D.   On: 10/30/2019 21:38   CT Chest W  Contrast  Result Date: 10/30/2019 CLINICAL DATA:  Larey SeatFell yesterday, found down, hematemesis, syncope EXAM: CT CHEST, ABDOMEN, AND PELVIS WITH CONTRAST TECHNIQUE: Multidetector CT imaging of the chest, abdomen and pelvis was performed following the standard protocol during bolus administration of intravenous contrast. CONTRAST:  100mL OMNIPAQUE IOHEXOL 300 MG/ML  SOLN COMPARISON:  None. FINDINGS: CT CHEST FINDINGS Cardiovascular: Heart and great vessels are unremarkable without pericardial effusion. Significant atherosclerosis of the aorta and coronary vessels. No evidence of thoracic aortic aneurysm or dissection. Mediastinum/Nodes: There is a large hiatal hernia. 1 cm hypodensity left lobe thyroid does not warrant further follow-up based on size and patient age. Trachea and esophagus are unremarkable. No pathologic adenopathy. Lungs/Pleura: No airspace disease, effusion, or pneumothorax. Central airways are patent. Musculoskeletal: No acute displaced fractures. Rotatory scoliosis of the thoracolumbar spine. Reconstructed images demonstrate no additional findings. CT ABDOMEN PELVIS FINDINGS Hepatobiliary: There are innumerable hepatic cysts replacing the majority of the left lobe of the liver. Gallbladder is moderately distended small calcified gallstones. No evidence of cholecystitis. Pancreas: Unremarkable. No pancreatic ductal dilatation or surrounding inflammatory changes. Spleen:  No splenic injury or perisplenic hematoma. Adrenals/Urinary Tract: The left kidney has been removed in the interim. Large extrarenal pelvis right kidney again seen. No evidence of hydronephrosis. Nonobstructing 7 mm calculus lower pole right kidney. The bladder is grossly unremarkable. The adrenals are not identified. Stomach/Bowel: There is a large hiatal hernia with the majority of the stomach in an intrathoracic position. There is no bowel obstruction or ileus. Diverticulosis of the sigmoid colon without diverticulitis.  Vascular/Lymphatic: There is extensive atherosclerosis of the aorta and its distal branches. No pathologic adenopathy. Reproductive: Uterus is atrophic.  No adnexal masses. Other: Heterogeneous free fluid is seen throughout the abdomen and pelvis. Fluid in the right flank is fairly simple in appearance compatible with bland ascites. Heterogeneous fluid of mixed attenuation within the central abdomen and pelvis extends approximately 16.7 x 10.8 cm in transverse dimension, consistent with intraperitoneal hemorrhage and likely clot. An underlying mesenteric mass would be difficult to exclude delayed imaging through this region does not demonstrate any pooling of contrast to suggest ongoing hemorrhage. There is no free intra-abdominal gas.  No abdominal wall hernia. Musculoskeletal: No acute displaced fractures. Reconstructed images demonstrate no additional findings. IMPRESSION: 1. Heterogeneous process in the central abdomen and pelvis, likely a large mesenteric hematoma. No contrast extravasation to suggest active hemorrhage. Underlying mass in this region cannot be excluded, and appropriate follow-up will be required. 2. No acute intrathoracic trauma. 3. Nonobstructing right renal calculus. The left kidney is not identified and likely surgically absent. 4. Innumerable hepatic cysts. 5. Large hiatal hernia. 6. 1 cm left lobe thyroid nodule. Not clinically significant; no follow-up imaging recommended. (Ref: J Am Coll Radiol. 2015 Feb;12(2): 143-50). 7.  Aortic Atherosclerosis (ICD10-I70.0). These results were called by telephone at the time of interpretation on 10/30/2019 at 9:48 pm to provider [facility] , who verbally acknowledged these results. Electronically Signed   By: Sharlet Salina M.D.   On: 10/30/2019 21:48   CT Cervical Spine Wo Contrast  Result Date: 10/30/2019 CLINICAL DATA:  85 year old female with trauma. EXAM: CT HEAD WITHOUT CONTRAST CT MAXILLOFACIAL WITHOUT CONTRAST CT CERVICAL SPINE WITHOUT  CONTRAST TECHNIQUE: Multidetector CT imaging of the head, cervical spine, and maxillofacial structures were performed using the standard protocol without intravenous contrast. Multiplanar CT image reconstructions of the cervical spine and maxillofacial structures were also generated. COMPARISON:  None. FINDINGS: CT HEAD FINDINGS Brain: Moderate age-related atrophy and chronic microvascular ischemic changes. There is no acute intracranial hemorrhage. No mass effect or midline shift. No extra-axial fluid collection. A 5 mm rounded calcified structure to the right of the anterior falx may represent a small calcified meningioma. Vascular: No hyperdense vessel or unexpected calcification. Skull: Normal. Negative for fracture or focal lesion. Other: None CT MAXILLOFACIAL FINDINGS Osseous: No acute fracture. No mandibular subluxation Orbits: The globes and retro-orbital fat are preserved. Sinuses: Clear. Soft tissues: Negative. CT CERVICAL SPINE FINDINGS Alignment: No acute subluxation. Skull base and vertebrae: No acute fracture. Osteopenia. Soft tissues and spinal canal: No prevertebral fluid or swelling. No visible canal hematoma. Disc levels:  No acute findings. Multilevel degenerative changes. Upper chest: Emphysema. Other: Bilateral carotid bulb calcified plaques. IMPRESSION: 1. No acute intracranial hemorrhage. Age-related atrophy and chronic microvascular ischemic changes. 2. No acute/traumatic cervical spine pathology. 3. No acute facial bone fractures. Electronically Signed   By: Elgie Collard M.D.   On: 10/30/2019 21:38   CT ABDOMEN PELVIS W CONTRAST  Result Date: 10/30/2019 CLINICAL DATA:  Larey Seat yesterday, found down, hematemesis, syncope EXAM: CT CHEST,  ABDOMEN, AND PELVIS WITH CONTRAST TECHNIQUE: Multidetector CT imaging of the chest, abdomen and pelvis was performed following the standard protocol during bolus administration of intravenous contrast. CONTRAST:  OMNIPAQUE IOHEXOL 300 MG/ML  SOLN  COMPARISON:  None. FINDINGS: CT CHEST FINDINGS Cardiovascular: Heart and great vessels are unremarkable without pericardial effusion. Significant atherosclerosis of the aorta and coronary vessels. No evidence of thoracic aortic aneurysm or dissection. Mediastinum/Nodes: There is a large hiatal hernia. 1 cm hypodensity left lobe thyroid does not warrant further follow-up based on size and patient age. Trachea and esophagus are unremarkable. No pathologic adenopathy. Lungs/Pleura: No airspace disease, effusion, or pneumothorax. Central airways are patent. Musculoskeletal: No acute displaced fractures. Rotatory scoliosis of the thoracolumbar spine. Reconstructed images demonstrate no additional findings. CT ABDOMEN PELVIS FINDINGS Hepatobiliary: There are innumerable hepatic cysts replacing the majority of the left lobe of the liver. Gallbladder is moderately distended small calcified gallstones. No evidence of cholecystitis. Pancreas: Unremarkable. No pancreatic ductal dilatation or surrounding inflammatory changes. Spleen: No splenic injury or perisplenic hematoma. Adrenals/Urinary Tract: The left kidney has been removed in the interim. Large extrarenal pelvis right kidney again seen. No evidence of hydronephrosis. Nonobstructing 7 mm calculus lower pole right kidney. The bladder is grossly unremarkable. The adrenals are not identified. Stomach/Bowel: There is a large hiatal hernia with the majority of the stomach in an intrathoracic position. There is no bowel obstruction or ileus. Diverticulosis of the sigmoid colon without diverticulitis. Vascular/Lymphatic: There is extensive atherosclerosis of the aorta and its distal branches. No pathologic adenopathy. Reproductive: Uterus is atrophic.  No adnexal masses. Other: Heterogeneous free fluid is seen throughout the abdomen and pelvis. Fluid in the right flank is fairly simple in appearance compatible with bland ascites. Heterogeneous fluid of mixed attenuation within  the central abdomen and pelvis extends approximately 16.7 x 10.8 cm in transverse dimension, consistent with intraperitoneal hemorrhage and likely clot. An underlying mesenteric mass would be difficult to exclude delayed imaging through this region does not demonstrate any pooling of contrast to suggest ongoing hemorrhage. There is no free intra-abdominal gas.  No abdominal wall hernia. Musculoskeletal: No acute displaced fractures. Reconstructed images demonstrate no additional findings. IMPRESSION: 1. Heterogeneous process in the central abdomen and pelvis, likely a large mesenteric hematoma. No contrast extravasation to suggest active hemorrhage. Underlying mass in this region cannot be excluded, and appropriate follow-up will be required. 2. No acute intrathoracic trauma. 3. Nonobstructing right renal calculus. The left kidney is not identified and likely surgically absent. 4. Innumerable hepatic cysts. 5. Large hiatal hernia. 6. 1 cm left lobe thyroid nodule. Not clinically significant; no follow-up imaging recommended. (Ref: J Am Coll Radiol. 2015 Feb;12(2): 143-50). 7.  Aortic Atherosclerosis (ICD10-I70.0). These results were called by telephone at the time of interpretation on 10/30/2019 at 9:48 pm to provider [facility] , who verbally acknowledged these results. Electronically Signed   By: Sharlet Salina M.D.   On: 10/30/2019 21:48   DG Chest Port 1 View  Result Date: 10/30/2019 CLINICAL DATA:  Syncope EXAM: PORTABLE CHEST 1 VIEW COMPARISON:  04/09/2005 FINDINGS: Single frontal view of the chest demonstrates a stable cardiac silhouette. Atherosclerosis of the thoracic aorta. No airspace disease, effusion, or pneumothorax. Stable rotatory scoliosis of the thoracolumbar spine. IMPRESSION: No acute intrathoracic process. Electronically Signed   By: Sharlet Salina M.D.   On: 10/30/2019 20:50   CT Maxillofacial Wo Contrast  Result Date: 10/30/2019 CLINICAL DATA:  85 year old female with trauma. EXAM: CT  HEAD WITHOUT CONTRAST CT MAXILLOFACIAL  WITHOUT CONTRAST CT CERVICAL SPINE WITHOUT CONTRAST TECHNIQUE: Multidetector CT imaging of the head, cervical spine, and maxillofacial structures were performed using the standard protocol without intravenous contrast. Multiplanar CT image reconstructions of the cervical spine and maxillofacial structures were also generated. COMPARISON:  None. FINDINGS: CT HEAD FINDINGS Brain: Moderate age-related atrophy and chronic microvascular ischemic changes. There is no acute intracranial hemorrhage. No mass effect or midline shift. No extra-axial fluid collection. A 5 mm rounded calcified structure to the right of the anterior falx may represent a small calcified meningioma. Vascular: No hyperdense vessel or unexpected calcification. Skull: Normal. Negative for fracture or focal lesion. Other: None CT MAXILLOFACIAL FINDINGS Osseous: No acute fracture. No mandibular subluxation Orbits: The globes and retro-orbital fat are preserved. Sinuses: Clear. Soft tissues: Negative. CT CERVICAL SPINE FINDINGS Alignment: No acute subluxation. Skull base and vertebrae: No acute fracture. Osteopenia. Soft tissues and spinal canal: No prevertebral fluid or swelling. No visible canal hematoma. Disc levels:  No acute findings. Multilevel degenerative changes. Upper chest: Emphysema. Other: Bilateral carotid bulb calcified plaques. IMPRESSION: 1. No acute intracranial hemorrhage. Age-related atrophy and chronic microvascular ischemic changes. 2. No acute/traumatic cervical spine pathology. 3. No acute facial bone fractures. Electronically Signed   By: Elgie Collard M.D.   On: 10/30/2019 21:38    EKG: Independently reviewed.  Normal sinus rhythm.  Assessment/Plan Principal Problem:   Mesenteric hematoma Active Problems:   ANEMIA, SECONDARY TO CHRONIC BLOOD LOSS   Syncope and collapse   Intraabdominal hemorrhage    1. Mesenteric hematoma status post fall with absent kidney for which trauma  surgery and neurology consult has been done.  Closely follow CBC on clear liquid diet for now.  We will follow surgical consult and recommendations. 2. Syncope cause not clear.  Monitor in telemetry and check 2D echo.  Denies any chest pain. 3. Fall with generalized body ache for which I have ordered CK levels. 4. Normocytic normochromic anemia no old labs to compare.  Check anemia panel follow CBC. 5. Acute renal failure with no old labs to compare.  Patient received fluids in the ER check UA follow metabolic panel. 6. Lactic acidosis likely from hypotension and fall.  No definite signs of any infection.  Improved with hydration. 7. Leukocytosis likely reactionary.  No signs of infection.  Follow CBC.  Given that patient has intra-abdominal hematoma with fall syncope will need close monitoring for any further deterioration in inpatient status.   DVT prophylaxis: SCDs since patient has intraabdominal hematoma. Code Status: Full code. Family Communication: Discussed with patient. Disposition Plan: To be determined. Consults called: Trauma surgery and urology. Admission status: Inpatient.   Eduard Clos MD [facility] Pager [phone].  If 7PM-7AM, please contact night-coverage [email or web address] Password [facility]  10/31/2019, 6:35 AM

## 2019-10-31 NOTE — Social Work (Signed)
CSW met with pt at bedside. CSW introduced self and explained her role. CSW completed sbirt with pt.  Pt scored a 0 on the sbirt scale. Pt denied alcohol use. Pt denied substance use. Pt did not need resources at this time.  Emeterio Reeve, Latanya Presser, [place] Social Worker [phone]

## 2019-10-31 NOTE — Progress Notes (Signed)
Pt was admitted by Dr Toniann Fail earlier this am. Please see his detailed H&P .  85 year old lady with no sig history presents with a mechanical fall, syncope and mild rhabdomyolysis. . Ct head and cervical spine is unremarkable.  CT abd and pelvis shows large mesenteric hematoma for which trauma surgeon consulted and recommendations.   Pt seen and examined at bedside.  She is alert and answering questions appropriately.  Lungs clear to auscultation.  CVS s1s2, RRR,  abd is soft , mildly tender generalized, bs+ Ext: no leg edema.    Plan:  Observation overnight , pain control, gentle hydration.  Recheck labs in am and monitor urine output.    Kathlen Mody, MD

## 2019-10-31 NOTE — Progress Notes (Signed)
Received patient from ED, AOx4, VSS with slightly elevated PR at 109, pain at 2/10, placed on telemetry, oriented to room, bed controls and plan of care. Patient now resting on bed with both eyes closed. Will monitor.
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
## 2019-10-31 NOTE — Progress Notes (Signed)
[facility] Progress Note     Subjective: Patient reports just feeling sore and weak all over. She denies abdominal pain, nausea or vomiting. She is unsure whether she has passed any flatus. She is tolerating sips of clears. She reports she has not urinated although one occurrence reported in chart.   Review of Systems  Constitutional: Positive for malaise/fatigue. Negative for chills and fever.  Respiratory: Negative for shortness of breath and wheezing.   Cardiovascular: Negative for chest pain and palpitations.  Gastrointestinal: Negative for abdominal pain, nausea and vomiting.  Genitourinary:       Decreased urinary frequency  Musculoskeletal: Positive for myalgias.  Neurological: Negative for dizziness and headaches.     Objective: Vital signs in last 24 hours: Temp:  [97.7 F (36.5 C)-99 F (37.2 C)] 98.9 F (37.2 C) (05/05 0640) Pulse Rate:  [100-110] 105 (05/05 0641) Resp:  [17-23] 20 (05/05 0640) BP: (104-160)/(55-120) 128/64 (05/05 0640) SpO2:  [91 %-100 %] 95 % (05/05 0641) Weight:  [45.5 kg] 45.5 kg (05/05 0252) Last BM Date: 10/29/19  Intake/Output from previous day: 05/04 0701 - 05/05 0700 In: 359.5 [P.O.:180; I.V.:179.5] Out: -  Intake/Output this shift: No intake/output data recorded.  PE: General: pleasant, WD, cachectic female who is laying in bed in NAD HEENT: head is normocephalic, atraumatic.  Sclera are noninjected.  PERRL.  Ears and nose without any masses or lesions.  Mouth is pink and moist Heart: sinus tachycardia in the low 100s.  Normal s1,s2. No obvious murmurs, gallops, or rubs noted.  Palpable radial and pedal pulses bilaterally Lungs: CTAB, no wheezes, rhonchi, or rales noted.  Respiratory effort nonlabored Abd: soft, NT, ND, +BS, no masses, hernias, or organomegaly MS: all 4 extremities are symmetrical with no cyanosis, clubbing, or edema. Skin: warm and dry with no masses, lesions, or rashes Neuro: Cranial nerves 2-12  grossly intact, sensation grossly intact throughout Psych: A&Ox3 with an appropriate affect.   Lab Results:  Recent Labs    10/31/19 0335 10/31/19 0645  WBC 14.6* 13.9*  HGB 8.4* 7.9*  HCT 25.4* 23.4*  PLT 178 153   BMET Recent Labs    10/30/19 1919 10/30/19 1919 10/30/19 1942 10/31/19 0335  NA 142   < > 142 140  K 4.1   < > 3.9 4.2  CL 109   < > 110 109  CO2 17*  --   --  21*  GLUCOSE 137*   < > 132* 112*  BUN 30*   < > 28* 26*  CREATININE 1.34*   < > 1.00 0.99  CALCIUM 9.0  --   --  8.7*   < > = values in this interval not displayed.   PT/INR Recent Labs    10/30/19 1919  LABPROT 13.0  INR 1.0   CMP     Component Value Date/Time   NA 140 10/31/2019 0335   K 4.2 10/31/2019 0335   CL 109 10/31/2019 0335   CO2 21 (L) 10/31/2019 0335   GLUCOSE 112 (H) 10/31/2019 0335   BUN 26 (H) 10/31/2019 0335   CREATININE 0.99 10/31/2019 0335   CALCIUM 8.7 (L) 10/31/2019 0335   PROT 5.6 (L) 10/30/2019 1919   ALBUMIN 3.0 (L) 10/30/2019 1919   AST 47 (H) 10/30/2019 1919   ALT 21 10/30/2019 1919   ALKPHOS 47 10/30/2019 1919   BILITOT 1.3 (H) 10/30/2019 1919   GFRNONAA 53 (L) 10/31/2019 0335   GFRAA >60 10/31/2019 0335   Lipase  Component Value Date/Time   LIPASE 18 10/30/2019 1919       Studies/Results: DG Pelvis 1-2 Views  Result Date: 10/30/2019 CLINICAL DATA:  Syncope EXAM: PELVIS - 1-2 VIEW COMPARISON:  None. FINDINGS: Supine frontal view of the pelvis excludes portions of the proximal left femur. No acute displaced fracture. Symmetrical bilateral hip osteoarthritis. Left convex scoliosis of the lumbar spine. Sacroiliac joints are normal. IMPRESSION: 1. No acute displaced fracture. Electronically Signed   By: Sharlet Salina M.D.   On: 10/30/2019 20:51   CT Head Wo Contrast  Result Date: 10/30/2019 CLINICAL DATA:  85 year old female with trauma. EXAM: CT HEAD WITHOUT CONTRAST CT MAXILLOFACIAL WITHOUT CONTRAST CT CERVICAL SPINE WITHOUT CONTRAST TECHNIQUE:  Multidetector CT imaging of the head, cervical spine, and maxillofacial structures were performed using the standard protocol without intravenous contrast. Multiplanar CT image reconstructions of the cervical spine and maxillofacial structures were also generated. COMPARISON:  None. FINDINGS: CT HEAD FINDINGS Brain: Moderate age-related atrophy and chronic microvascular ischemic changes. There is no acute intracranial hemorrhage. No mass effect or midline shift. No extra-axial fluid collection. A 5 mm rounded calcified structure to the right of the anterior falx may represent a small calcified meningioma. Vascular: No hyperdense vessel or unexpected calcification. Skull: Normal. Negative for fracture or focal lesion. Other: None CT MAXILLOFACIAL FINDINGS Osseous: No acute fracture. No mandibular subluxation Orbits: The globes and retro-orbital fat are preserved. Sinuses: Clear. Soft tissues: Negative. CT CERVICAL SPINE FINDINGS Alignment: No acute subluxation. Skull base and vertebrae: No acute fracture. Osteopenia. Soft tissues and spinal canal: No prevertebral fluid or swelling. No visible canal hematoma. Disc levels:  No acute findings. Multilevel degenerative changes. Upper chest: Emphysema. Other: Bilateral carotid bulb calcified plaques. IMPRESSION: 1. No acute intracranial hemorrhage. Age-related atrophy and chronic microvascular ischemic changes. 2. No acute/traumatic cervical spine pathology. 3. No acute facial bone fractures. Electronically Signed   By: Elgie Collard M.D.   On: 10/30/2019 21:38   CT Chest W Contrast  Result Date: 10/30/2019 CLINICAL DATA:  [place]are[place] Se[place] yesterday, found down, hematemesis, syncope EXAM: CT CHEST, ABDOMEN, AND PELVIS WITH CONTRAST TECHNIQUE: Multidetector CT imaging of the chest, abdomen and pelvis was performed following the standard protocol during bolus administration of intravenous contrast. CONTRAST:  OMNIPAQUE IOHEXOL 300 MG/ML  SOLN COMPARISON:  None. FINDINGS: CT  CHEST FINDINGS Cardiovascular: Heart and great vessels are unremarkable without pericardial effusion. Significant atherosclerosis of the aorta and coronary vessels. No evidence of thoracic aortic aneurysm or dissection. Mediastinum/Nodes: There is a large hiatal hernia. 1 cm hypodensity left lobe thyroid does not warrant further follow-up based on size and patient age. Trachea and esophagus are unremarkable. No pathologic adenopathy. Lungs/Pleura: No airspace disease, effusion, or pneumothorax. Central airways are patent. Musculoskeletal: No acute displaced fractures. Rotatory scoliosis of the thoracolumbar spine. Reconstructed images demonstrate no additional findings. CT ABDOMEN PELVIS FINDINGS Hepatobiliary: There are innumerable hepatic cysts replacing the majority of the left lobe of the liver. Gallbladder is moderately distended small calcified gallstones. No evidence of cholecystitis. Pancreas: Unremarkable. No pancreatic ductal dilatation or surrounding inflammatory changes. Spleen: No splenic injury or perisplenic hematoma. Adrenals/Urinary Tract: The left kidney has been removed in the interim. Large extrarenal pelvis right kidney again seen. No evidence of hydronephrosis. Nonobstructing 7 mm calculus lower pole right kidney. The bladder is grossly unremarkable. The adrenals are not identified. Stomach/Bowel: There is a large hiatal hernia with the majority of the stomach in an intrathoracic position. There is no bowel obstruction or ileus. Diverticulosis of  the sigmoid colon without diverticulitis. Vascular/Lymphatic: There is extensive atherosclerosis of the aorta and its distal branches. No pathologic adenopathy. Reproductive: Uterus is atrophic.  No adnexal masses. Other: Heterogeneous free fluid is seen throughout the abdomen and pelvis. Fluid in the right flank is fairly simple in appearance compatible with bland ascites. Heterogeneous fluid of mixed attenuation within the central abdomen and pelvis  extends approximately 16.7 x 10.8 cm in transverse dimension, consistent with intraperitoneal hemorrhage and likely clot. An underlying mesenteric mass would be difficult to exclude delayed imaging through this region does not demonstrate any pooling of contrast to suggest ongoing hemorrhage. There is no free intra-abdominal gas.  No abdominal wall hernia. Musculoskeletal: No acute displaced fractures. Reconstructed images demonstrate no additional findings. IMPRESSION: 1. Heterogeneous process in the central abdomen and pelvis, likely a large mesenteric hematoma. No contrast extravasation to suggest active hemorrhage. Underlying mass in this region cannot be excluded, and appropriate follow-up will be required. 2. No acute intrathoracic trauma. 3. Nonobstructing right renal calculus. The left kidney is not identified and likely surgically absent. 4. Innumerable hepatic cysts. 5. Large hiatal hernia. 6. 1 cm left lobe thyroid nodule. Not clinically significant; no follow-up imaging recommended. (Ref: J Am Coll Radiol. 2015 Feb;12(2): 143-50). 7.  Aortic Atherosclerosis (ICD10-I70.0). These results were called by telephone at the time of interpretation on 10/30/2019 at 9:48 pm to provider [facility] , who verbally acknowledged these results. Electronically Signed   By: Randa Ngo M.D.   On: 10/30/2019 21:48   CT Cervical Spine Wo Contrast  Result Date: 10/30/2019 CLINICAL DATA:  85 year old female with trauma. EXAM: CT HEAD WITHOUT CONTRAST CT MAXILLOFACIAL WITHOUT CONTRAST CT CERVICAL SPINE WITHOUT CONTRAST TECHNIQUE: Multidetector CT imaging of the head, cervical spine, and maxillofacial structures were performed using the standard protocol without intravenous contrast. Multiplanar CT image reconstructions of the cervical spine and maxillofacial structures were also generated. COMPARISON:  None. FINDINGS: CT HEAD FINDINGS Brain: Moderate age-related atrophy and chronic microvascular ischemic changes. There  is no acute intracranial hemorrhage. No mass effect or midline shift. No extra-axial fluid collection. A 5 mm rounded calcified structure to the right of the anterior falx may represent a small calcified meningioma. Vascular: No hyperdense vessel or unexpected calcification. Skull: Normal. Negative for fracture or focal lesion. Other: None CT MAXILLOFACIAL FINDINGS Osseous: No acute fracture. No mandibular subluxation Orbits: The globes and retro-orbital fat are preserved. Sinuses: Clear. Soft tissues: Negative. CT CERVICAL SPINE FINDINGS Alignment: No acute subluxation. Skull base and vertebrae: No acute fracture. Osteopenia. Soft tissues and spinal canal: No prevertebral fluid or swelling. No visible canal hematoma. Disc levels:  No acute findings. Multilevel degenerative changes. Upper chest: Emphysema. Other: Bilateral carotid bulb calcified plaques. IMPRESSION: 1. No acute intracranial hemorrhage. Age-related atrophy and chronic microvascular ischemic changes. 2. No acute/traumatic cervical spine pathology. 3. No acute facial bone fractures. Electronically Signed   By: Anner Crete M.D.   On: 10/30/2019 21:38   CT ABDOMEN PELVIS W CONTRAST  Result Date: 10/30/2019 CLINICAL DATA:  [place] yesterday, found down, hematemesis, syncope EXAM: CT CHEST, ABDOMEN, AND PELVIS WITH CONTRAST TECHNIQUE: Multidetector CT imaging of the chest, abdomen and pelvis was performed following the standard protocol during bolus administration of intravenous contrast. CONTRAST:  1103mL OMNIPAQUE IOHEXOL 300 MG/ML  SOLN COMPARISON:  None. FINDINGS: CT CHEST FINDINGS Cardiovascular: Heart and great vessels are unremarkable without pericardial effusion. Significant atherosclerosis of the aorta and coronary vessels. No evidence of thoracic aortic aneurysm or dissection. Mediastinum/Nodes: There is  a large hiatal hernia. 1 cm hypodensity left lobe thyroid does not warrant further follow-up based on size and patient age. Trachea and  esophagus are unremarkable. No pathologic adenopathy. Lungs/Pleura: No airspace disease, effusion, or pneumothorax. Central airways are patent. Musculoskeletal: No acute displaced fractures. Rotatory scoliosis of the thoracolumbar spine. Reconstructed images demonstrate no additional findings. CT ABDOMEN PELVIS FINDINGS Hepatobiliary: There are innumerable hepatic cysts replacing the majority of the left lobe of the liver. Gallbladder is moderately distended small calcified gallstones. No evidence of cholecystitis. Pancreas: Unremarkable. No pancreatic ductal dilatation or surrounding inflammatory changes. Spleen: No splenic injury or perisplenic hematoma. Adrenals/Urinary Tract: The left kidney has been removed in the interim. Large extrarenal pelvis right kidney again seen. No evidence of hydronephrosis. Nonobstructing 7 mm calculus lower pole right kidney. The bladder is grossly unremarkable. The adrenals are not identified. Stomach/Bowel: There is a large hiatal hernia with the majority of the stomach in an intrathoracic position. There is no bowel obstruction or ileus. Diverticulosis of the sigmoid colon without diverticulitis. Vascular/Lymphatic: There is extensive atherosclerosis of the aorta and its distal branches. No pathologic adenopathy. Reproductive: Uterus is atrophic.  No adnexal masses. Other: Heterogeneous free fluid is seen throughout the abdomen and pelvis. Fluid in the right flank is fairly simple in appearance compatible with bland ascites. Heterogeneous fluid of mixed attenuation within the central abdomen and pelvis extends approximately 16.7 x 10.8 cm in transverse dimension, consistent with intraperitoneal hemorrhage and likely clot. An underlying mesenteric mass would be difficult to exclude delayed imaging through this region does not demonstrate any pooling of contrast to suggest ongoing hemorrhage. There is no free intra-abdominal gas.  No abdominal wall hernia. Musculoskeletal: No acute  displaced fractures. Reconstructed images demonstrate no additional findings. IMPRESSION: 1. Heterogeneous process in the central abdomen and pelvis, likely a large mesenteric hematoma. No contrast extravasation to suggest active hemorrhage. Underlying mass in this region cannot be excluded, and appropriate follow-up will be required. 2. No acute intrathoracic trauma. 3. Nonobstructing right renal calculus. The left kidney is not identified and likely surgically absent. 4. Innumerable hepatic cysts. 5. Large hiatal hernia. 6. 1 cm left lobe thyroid nodule. Not clinically significant; no follow-up imaging recommended. (Ref: J Am Coll Radiol. 2015 Feb;12(2): 143-50). 7.  Aortic Atherosclerosis (ICD10-I70.0). These results were called by telephone at the time of interpretation on 10/30/2019 at 9:48 pm to provider [facility] , who verbally acknowledged these results. Electronically Signed   By: Sharlet SalinaMichael  Brown M.D.   On: 10/30/2019 21:48   DG Chest Port 1 View  Result Date: 10/30/2019 CLINICAL DATA:  Syncope EXAM: PORTABLE CHEST 1 VIEW COMPARISON:  04/09/2005 FINDINGS: Single frontal view of the chest demonstrates a stable cardiac silhouette. Atherosclerosis of the thoracic aorta. No airspace disease, effusion, or pneumothorax. Stable rotatory scoliosis of the thoracolumbar spine. IMPRESSION: No acute intrathoracic process. Electronically Signed   By: Sharlet SalinaMichael  Brown M.D.   On: 10/30/2019 20:50   CT Maxillofacial Wo Contrast  Result Date: 10/30/2019 CLINICAL DATA:  [age] year old female with trauma. EXAM: CT HEAD WITHOUT CONTRAST CT MAXILLOFACIAL WITHOUT CONTRAST CT CERVICAL SPINE WITHOUT CONTRAST TECHNIQUE: Multidetector CT imaging of the head, cervical spine, and maxillofacial structures were performed using the standard protocol without intravenous contrast. Multiplanar CT image reconstructions of the cervical spine and maxillofacial structures were also generated. COMPARISON:  None. FINDINGS: CT HEAD FINDINGS  Brain: Moderate age-related atrophy and chronic microvascular ischemic changes. There is no acute intracranial hemorrhage. No mass effect or midline shift. No extra-axial  fluid collection. A 5 mm rounded calcified structure to the right of the anterior falx may represent a small calcified meningioma. Vascular: No hyperdense vessel or unexpected calcification. Skull: Normal. Negative for fracture or focal lesion. Other: None CT MAXILLOFACIAL FINDINGS Osseous: No acute fracture. No mandibular subluxation Orbits: The globes and retro-orbital fat are preserved. Sinuses: Clear. Soft tissues: Negative. CT CERVICAL SPINE FINDINGS Alignment: No acute subluxation. Skull base and vertebrae: No acute fracture. Osteopenia. Soft tissues and spinal canal: No prevertebral fluid or swelling. No visible canal hematoma. Disc levels:  No acute findings. Multilevel degenerative changes. Upper chest: Emphysema. Other: Bilateral carotid bulb calcified plaques. IMPRESSION: 1. No acute intracranial hemorrhage. Age-related atrophy and chronic microvascular ischemic changes. 2. No acute/traumatic cervical spine pathology. 3. No acute facial bone fractures. Electronically Signed   By: Elgie Collard M.D.   On: 10/30/2019 21:38    Anti-infectives: Anti-infectives (From admission, onward)   Start     Dose/Rate Route Frequency Ordered Stop   10/30/19 2230  metroNIDAZOLE (FLAGYL) IVPB 500 mg     500 mg 100 mL/hr over 60 Minutes Intravenous  Once 10/30/19 2216 10/31/19 0019   10/30/19 2230  ciprofloxacin (CIPRO) IVPB 400 mg     400 mg 200 mL/hr over 60 Minutes Intravenous  Once 10/30/19 2216 10/31/19 0019       Assessment/Plan Fall on 5/3 and down for extended time ?heterogenous fluid in abdomen - ?possible hemorrhage - this would be an unusual traumatic injury given mechanism, underlying neoplasm with hemorrhage from this seems more likely  - abdominal exam completely benign - ok to start FLD ABL anemia - hgb 7.9 from 9.2 -  some of this may be dilutional but would continue to monitor closely, transfuse for hgb < 7.0 Multiple liver cysts Absent left kidney Rhabdomyolysis - CK elevated at 1,209, Cr 0.99, 1 urine occurrence recorded, consider placement of foley catheter for strict I/O  FEN: FLD, IVF VTE: SCDs, no chemical VTE in setting of ABL anemia  ID: cipro/flagyl    LOS: 1 day    Juliet Rude , [facility] 10/31/2019, 7:53 AM Please see Amion for pager number during day hours 7:00am-4:30pm

## 2019-10-31 NOTE — Progress Notes (Signed)
  Echocardiogram 2D Echocardiogram has been performed.  Jacqueline Mathews 10/31/2019, 8:53 AM

## 2019-10-31 NOTE — TOC Initial Note (Signed)
Transition of Care [facility]) - Initial/Assessment Note    Patient Details  Name: Jacqueline Mathews MRN: [id number] Date of Birth: [birth year]  Transition of [facility]) CM/SW Contact:    Doy Hutching, LCSW Phone Number: 10/31/2019, 1:11 PM  Clinical Narrative:                 CSW spoke with pt at bedside. Introduced self, role, reason for visit. Confirmed home address, and that PCP is no longer her PCP as they retired. Pt from home alone, she was watching her daughters dog as she recovered from surgery. Pt usually independent with all daily cares. Doesn't have any DME at home. She does feel sore, does understand therapy is coming to see her today.   Pt declined to let this writer see if PCP practice would be able to get her in with another provider despite me explaining that in order to arrange further care we would need active PCP. TOC team will f/u with pt again after therapy has evaluated her needs.    Expected Discharge Plan: Home w Home Health Services Barriers to Discharge: Continued Medical Work up   Patient Goals and CMS Choice Patient states their goals for this hospitalization and ongoing recovery are:: feel better, be able to get back home independently   Choice offered to / list presented to : Patient  Expected Discharge Plan and Services Expected Discharge Plan: Home w Home Health Services In-house Referral: Clinical Social Work Discharge Planning Services: CM Consult Post Acute Care Choice: Durable Medical Equipment Living arrangements for the past 2 months: Single Family Home  Prior Living Arrangements/Services Living arrangements for the past 2 months: Single Family Home Lives with:: Self Patient language and need for interpreter reviewed:: Yes(no needs) Do you feel safe going back to the place where you live?: Yes      Need for Family Participation in Patient Care: Yes (Comment)(assistance w/ daily cares as needed) Care giver support system in place?: Yes  (comment)(pt daughter)   Criminal Activity/Legal Involvement Pertinent to Current Situation/Hospitalization: No - Comment as needed  Permission Sought/Granted Permission sought to share information with : Facility Medical sales representative, Family Supports Permission granted to share information with : Yes, Verbal Permission Granted  Share Information with NAME: Asencion Partridge     Permission granted to share info w Relationship: daughter  Permission granted to share info w Contact Information: [phone]  Emotional Assessment Appearance:: Appears stated age Attitude/Demeanor/Rapport: Engaged, Gracious Affect (typically observed): Accepting, Adaptable, Appropriate, Overwhelmed Orientation: : Oriented to Self, Oriented to Place, Oriented to  Time, Oriented to Situation Alcohol / Substance Use: Not Applicable Psych Involvement: (n/a)  Admission diagnosis:  Syncope and collapse [R55] Mesenteric hematoma [B34.193X] Intraabdominal hemorrhage [R58] Patient Active Problem List   Diagnosis Date Noted  . Syncope and collapse 10/31/2019  . Intraabdominal hemorrhage   . Mesenteric hematoma 10/30/2019  . 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 SHINGLES 08/25/2009  . Iron deficiency anemia 07/18/2009  . BLOOD IN STOOL, OCCULT 07/18/2009  . ANEMIA, SECONDARY TO CHRONIC BLOOD LOSS 07/17/2009  . GERD 01/03/2007  . HYDRONEPHROSIS, BILATERAL 01/03/2007  . MENOPAUSAL SYNDROME 01/03/2007   PCP:  Gordy Savers, MD Pharmacy:   [facility] [address], [place] - [address] [place] [address] Phone: [phone] Fax: [phone]  Readmission Risk Interventions Readmission Risk Prevention Plan 10/31/2019  Post Dischage Appt Not Complete  Appt Comments pt PCP retired  Medication Screening Complete  Transportation Screening Complete  Some recent data might be hidden

## 2019-11-01 DIAGNOSIS — D500 Iron deficiency anemia secondary to blood loss (chronic): Secondary | ICD-10-CM

## 2019-11-01 DIAGNOSIS — R58 Hemorrhage, not elsewhere classified: Secondary | ICD-10-CM

## 2019-11-01 LAB — PREPARE RBC (CROSSMATCH)

## 2019-11-01 LAB — BASIC METABOLIC PANEL
Anion gap: 3 — ABNORMAL LOW (ref 5–15)
BUN: 14 mg/dL (ref 8–23)
CO2: 24 mmol/L (ref 22–32)
Calcium: 8.4 mg/dL — ABNORMAL LOW (ref 8.9–10.3)
Chloride: 113 mmol/L — ABNORMAL HIGH (ref 98–111)
Creatinine, Ser: 0.62 mg/dL (ref 0.44–1.00)
GFR calc Af Amer: >60 mL/min (ref >60)
GFR calc non Af Amer: >60 mL/min (ref >60)
Glucose, Bld: 113 mg/dL — ABNORMAL HIGH (ref 70–99)
Potassium: 3.8 mmol/L (ref 3.5–5.1)
Sodium: 140 mmol/L (ref 135–145)

## 2019-11-01 LAB — CK: Total CK: 257 U/L — ABNORMAL HIGH (ref 38–234)

## 2019-11-01 LAB — CBC
HCT: 20.9 % — ABNORMAL LOW (ref 36.0–46.0)
HCT: 29.9 % — ABNORMAL LOW (ref 36.0–46.0)
Hemoglobin: 6.9 g/dL — CL (ref 12.0–15.0)
Hemoglobin: 10 g/dL — ABNORMAL LOW (ref 12.0–15.0)
MCH: 31.9 pg (ref 26.0–34.0)
MCH: 30.9 pg (ref 26.0–34.0)
MCHC: 33 g/dL (ref 30.0–36.0)
MCHC: 33.4 g/dL (ref 30.0–36.0)
MCV: 96.8 fL (ref 80.0–100.0)
MCV: 92.3 fL (ref 80.0–100.0)
Platelets: 122 10*3/uL — ABNORMAL LOW (ref 150–400)
Platelets: 151 10*3/uL (ref 150–400)
RBC: 2.16 MIL/uL — ABNORMAL LOW (ref 3.87–5.11)
RBC: 3.24 MIL/uL — ABNORMAL LOW (ref 3.87–5.11)
RDW: 14.4 % (ref 11.5–15.5)
RDW: 16 % — ABNORMAL HIGH (ref 11.5–15.5)
WBC: 8.4 10*3/uL (ref 4.0–10.5)
WBC: 8.3 10*3/uL (ref 4.0–10.5)
nRBC: 0 % (ref 0.0–0.2)
nRBC: 0 % (ref 0.0–0.2)

## 2019-11-01 LAB — HEMOGLOBIN AND HEMATOCRIT, BLOOD
HCT: 28.9 % — ABNORMAL LOW (ref 36.0–46.0)
Hemoglobin: 9.5 g/dL — ABNORMAL LOW (ref 12.0–15.0)

## 2019-11-01 MED ORDER — SODIUM CHLORIDE 0.9% IV SOLUTION: Freq: Once | INTRAVENOUS | Status: DC

## 2019-11-01 MED ORDER — SODIUM CHLORIDE 0.9% IV SOLUTION: Freq: Once | INTRAVENOUS | Status: AC

## 2019-11-01 NOTE — NC FL2 (Signed)
[place] LEVEL OF CARE SCREENING TOOL     IDENTIFICATION  Patient Name: Jacqueline Mathews Birthdate: 1935/02/04 Sex: female Admission Date (Current Location): 10/30/2019  [facility] and [place] Number:  Herbalist and Address:  [facility], [place] [address], [place], [place] [address]      Provider Number: [id number]  Attending Physician Name and Address:  Hosie Poisson, MD  Relative Name and Phone Number:       Current Level of Care: Hospital Recommended Level of Care: [place] Prior Approval Number:    Date Approved/Denied:   PASRR Number: [id number]  Discharge Plan: SNF    Current Diagnoses: Patient Active Problem List   Diagnosis Date Noted  . Syncope and collapse 10/31/2019  . Intraabdominal hemorrhage   . Mesenteric hematoma 10/30/2019  . 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 SHINGLES 08/25/2009  . Iron deficiency anemia 07/18/2009  . BLOOD IN STOOL, OCCULT 07/18/2009  . ANEMIA, SECONDARY TO CHRONIC BLOOD LOSS 07/17/2009  . GERD 01/03/2007  . HYDRONEPHROSIS, BILATERAL 01/03/2007  . MENOPAUSAL SYNDROME 01/03/2007    Orientation RESPIRATION BLADDER Height & Weight     Self, Time, Situation, Place  Normal Incontinent, External catheter Weight: 100 lb 5 oz (45.5 kg) Height:  5\' 4"  (162.6 cm)  BEHAVIORAL SYMPTOMS/MOOD NEUROLOGICAL BOWEL NUTRITION STATUS      Continent Diet(see discharge summary)  AMBULATORY STATUS COMMUNICATION OF NEEDS Skin   Extensive Assist Verbally Bruising, Skin abrasions(skin tear on hand)                       Personal Care Assistance Level of Assistance  Bathing, Dressing, Feeding Bathing Assistance: Maximum assistance Feeding assistance: Independent Dressing Assistance: Maximum assistance     Functional Limitations Info  Sight, Hearing, Speech Sight Info: Adequate Hearing Info: Adequate Speech Info: Adequate    SPECIAL CARE FACTORS FREQUENCY  OT (By licensed  OT), PT (By licensed PT)     PT Frequency: 5x week OT Frequency: 5x week            Contractures Contractures Info: Not present    Additional Factors Info  Code Status, Allergies Code Status Info: Full Code Allergies Info: Clarithromycin, Penicillin G Benzathine, Sulfamethoxazole-trimethoprim           Current Medications (11/01/2019):  This is the current hospital active medication list Current Facility-Administered Medications  Medication Dose Route Frequency Provider Last Rate Last Admin  . 0.9 %  sodium chloride infusion (Manually program via Guardrails IV Fluids)   Intravenous Once Hosie Poisson, MD      . acetaminophen (TYLENOL) tablet 650 mg  650 mg Oral Q6H PRN Norm Parcel, PA-C   650 mg at 10/31/19 2139  . docusate sodium (COLACE) capsule 100 mg  100 mg Oral BID Norm Parcel, PA-C   100 mg at 10/31/19 2139  . latanoprost (XALATAN) 0.005 % ophthalmic solution 1 drop  1 drop Both Eyes QHS Rise Patience, MD   1 drop at 10/31/19 2143  . ondansetron (ZOFRAN) tablet 4 mg  4 mg Oral Q6H PRN Rise Patience, MD       Or  . ondansetron [facility]) injection 4 mg  4 mg Intravenous Q6H PRN Rise Patience, MD      . polyethylene glycol (MIRALAX / GLYCOLAX) packet 17 g  17 g Oral Daily PRN Norm Parcel, PA-C      . timolol (TIMOPTIC) 0.25 % ophthalmic solution  1 drop  1 drop Both Eyes Daily Eduard Clos, MD   1 drop at 10/31/19 1112     Discharge Medications: Please see discharge summary for a list of discharge medications.  Relevant Imaging Results:  Relevant Lab Results:   Additional Information SS#[id number] [place], [place]

## 2019-11-01 NOTE — Progress Notes (Signed)
PROGRESS NOTE    Jacqueline Mathews  ZOX:[id number] DOB: 09/14/1934 DOA: 10/30/2019 PCP: Marletta Lor, MD    Chief Complaint  Patient presents with  . Near Syncope    Brief Narrative:   85 year old lady with no sig history presents with a mechanical fall, syncope and mild rhabdomyolysis. . Ct head and cervical spine is unremarkable.  CT abd and pelvis shows large mesenteric hematoma for which trauma surgeon consulted and recommendations.  urology consulted , suggested non functional left kidney,  Probably hematoma from possible renal injury from the remaninig tissue vs disruption of the ureteropelvic junction. Recommended to treat this as a perinephric hematoma/ renal injury with bedrest and serial hemoglobin. Trauma surgery consulted and no further recommendations from surgery standpoint.   Assessment & Plan:   Principal Problem:   Mesenteric hematoma Active Problems:   ANEMIA, SECONDARY TO CHRONIC BLOOD LOSS   Syncope and collapse   Intraabdominal hemorrhage  Syncope: Check orthostatics in am with PT.  Echocardiogram unremarkable, showed hyperdynamic EF and grade 1 diastolic dysfunction.  No arrhythmias on telemetry.    Mesenteric hematoma/ perinephric hematoma:  posisbly from the mechanical fall.  Transfused 1 unit of prbc transfusion, repeat H&H IS greater than 9.    Anemia of blood loss from mechanical fall Transfuse to keep hemoglobin greater than 7.    Lactic acidosis from hypotension Resolved.    AKI:  Resolved.    Mild rhabdomyolysis:  Much improved from IV fluids.     DVT prophylaxis: scd's Code Status:  Full code.  Family Communication: family at bedside.  Disposition:   Status is: Inpatient  Remains inpatient appropriate because:Unsafe d/c plan   Dispo: The patient is from: Home              Anticipated d/c is to: SNF              Anticipated d/c date is: 1 day              Patient currently is not medically stable to  d/c.        Consultants:   Urology  Trauma Surgery.      Procedures: none.    Antimicrobials: none.    Subjective: Pt reports not feeling good, sore all over, slightly nauseated, no chest pain or sob.   Objective: Vitals:   10/31/19 2139 11/01/19 0240 11/01/19 0316 11/01/19 0553  BP:  (!) 132/104 139/73 131/73  Pulse: (!) 102 (!) 106 94 92  Resp: 18 18 18 17   Temp:  97.7 F (36.5 C) 98.4 F (36.9 C) 98.3 F (36.8 C)  TempSrc:  Oral Oral Oral  SpO2: 94% 91% 98% 93%  Weight:      Height:        Intake/Output Summary (Last 24 hours) at 11/01/2019 1556 Last data filed at 11/01/2019 1100 Gross per 24 hour  Intake 2457.68 ml  Output 300 ml  Net 2157.68 ml   Filed Weights   10/31/19 0252  Weight: 45.5 kg    Examination:  General exam: Appears calm and comfortable  Respiratory system: Clear to auscultation. Respiratory effort normal. Cardiovascular system: S1 & S2 heard, RRR. No JVD, . No pedal edema. Gastrointestinal system: Abdomen is nondistended, soft and nontender. Normal bowel sounds heard. Central nervous system: Alert and oriented. Non focal.  Extremities: Symmetric 5 x 5 power. Skin: No rashes, lesions  Psychiatry: flat affect.     Data Reviewed: I have personally reviewed following labs and imaging  studies  CBC: Recent Labs  Lab 10/30/19 1919 10/30/19 1942 10/31/19 0335 10/31/19 0335 10/31/19 0645 10/31/19 1150 10/31/19 1844 11/01/19 0122 11/01/19 0909  WBC 18.2*   < > 14.6*  --  13.9* 13.4* 13.7* 8.4  --   NEUTROABS 16.0*  --   --   --   --   --   --   --   --   HGB 9.8*   < > 8.4*   < > 7.9* 7.8* 7.9* 6.9* 9.5*  HCT 29.2*   < > 25.4*   < > 23.4* 23.7* 24.3* 20.9* 28.9*  MCV 95.7   < > 95.8  --  95.5 97.1 97.6 96.8  --   PLT 233   < > 178  --  153 154 178 122*  --    < > = values in this interval not displayed.    Basic Metabolic Panel: Recent Labs  Lab 10/30/19 1919 10/30/19 1942 10/31/19 0335 11/01/19 0909  NA 142 142  140 140  K 4.1 3.9 4.2 3.8  CL 109 110 109 113*  CO2 17*  --  21* 24  GLUCOSE 137* 132* 112* 113*  BUN 30* 28* 26* 14  CREATININE 1.34* 1.00 0.99 0.62  CALCIUM 9.0  --  8.7* 8.4*    GFR: Estimated Creatinine Clearance: 38.9 mL/min (by C-G formula based on SCr of 0.62 mg/dL).  Liver Function Tests: Recent Labs  Lab 10/30/19 1919  AST 47*  ALT 21  ALKPHOS 47  BILITOT 1.3*  PROT 5.6*  ALBUMIN 3.0*    CBG: No results for input(s): GLUCAP in the last 168 hours.   Recent Results (from the past 240 hour(s))  Respiratory Panel by RT PCR (Flu A&B, Covid) - Nasopharyngeal Swab     Status: None   Collection Time: 10/30/19 10:14 PM   Specimen: Nasopharyngeal Swab  Result Value Ref Range Status   SARS Coronavirus 2 by RT PCR NEGATIVE NEGATIVE Final    Comment: (NOTE) SARS-CoV-2 target nucleic acids are NOT DETECTED. The SARS-CoV-2 RNA is generally detectable in upper respiratoy specimens during the acute phase of infection. The lowest concentration of SARS-CoV-2 viral copies this assay can detect is 131 copies/mL. A negative result does not preclude SARS-Cov-2 infection and should not be used as the sole basis for treatment or other patient management decisions. A negative result may occur with  improper specimen collection/handling, submission of specimen other than nasopharyngeal swab, presence of viral mutation(s) within the areas targeted by this assay, and inadequate number of viral copies (<131 copies/mL). A negative result must be combined with clinical observations, patient history, and epidemiological information. The expected result is Negative. Fact Sheet for Patients:  https://www.moore.com/ Fact Sheet for Healthcare Providers:  https://www.young.biz/ This test is not yet ap proved or cleared by the [place] FDA and  has been authorized for detection and/or diagnosis of SARS-CoV-2 by FDA under an Emergency Use  Authorization (EUA). This EUA will remain  in effect (meaning this test can be used) for the duration of the COVID-19 declaration under Section 564(b)(1) of the Act, 21 U.S.C. section 360bbb-3(b)(1), unless the authorization is terminated or revoked sooner.    Influenza A by PCR NEGATIVE NEGATIVE Final   Influenza B by PCR NEGATIVE NEGATIVE Final    Comment: (NOTE) The Xpert Xpress SARS-CoV-2/FLU/RSV assay is intended as an aid in  the diagnosis of influenza from Nasopharyngeal swab specimens and  should not be used as a sole basis for treatment.  Nasal washings and  aspirates are unacceptable for Xpert Xpress SARS-CoV-2/FLU/RSV  testing. Fact Sheet for Patients: https://www.moore.com/ Fact Sheet for Healthcare Providers: https://www.young.biz/ This test is not yet approved or cleared by the [place] FDA and  has been authorized for detection and/or diagnosis of SARS-CoV-2 by  FDA under an Emergency Use Authorization (EUA). This EUA will remain  in effect (meaning this test can be used) for the duration of the  Covid-19 declaration under Section 564(b)(1) of the Act, 21  U.S.C. section 360bbb-3(b)(1), unless the authorization is  terminated or revoked. Performed at [facility], [address], [place], [place] [address]          Radiology Studies: DG Pelvis 1-2 Views  Result Date: 10/30/2019 CLINICAL DATA:  Syncope EXAM: PELVIS - 1-2 VIEW COMPARISON:  None. FINDINGS: Supine frontal view of the pelvis excludes portions of the proximal left femur. No acute displaced fracture. Symmetrical bilateral hip osteoarthritis. Left convex scoliosis of the lumbar spine. Sacroiliac joints are normal. IMPRESSION: 1. No acute displaced fracture. Electronically Signed   By: Sharlet Salina M.D.   On: 10/30/2019 20:51   CT Head Wo Contrast  Result Date: 10/30/2019 CLINICAL DATA:  85 year old female with trauma. EXAM: CT HEAD WITHOUT CONTRAST CT  MAXILLOFACIAL WITHOUT CONTRAST CT CERVICAL SPINE WITHOUT CONTRAST TECHNIQUE: Multidetector CT imaging of the head, cervical spine, and maxillofacial structures were performed using the standard protocol without intravenous contrast. Multiplanar CT image reconstructions of the cervical spine and maxillofacial structures were also generated. COMPARISON:  None. FINDINGS: CT HEAD FINDINGS Brain: Moderate age-related atrophy and chronic microvascular ischemic changes. There is no acute intracranial hemorrhage. No mass effect or midline shift. No extra-axial fluid collection. A 5 mm rounded calcified structure to the right of the anterior falx may represent a small calcified meningioma. Vascular: No hyperdense vessel or unexpected calcification. Skull: Normal. Negative for fracture or focal lesion. Other: None CT MAXILLOFACIAL FINDINGS Osseous: No acute fracture. No mandibular subluxation Orbits: The globes and retro-orbital fat are preserved. Sinuses: Clear. Soft tissues: Negative. CT CERVICAL SPINE FINDINGS Alignment: No acute subluxation. Skull base and vertebrae: No acute fracture. Osteopenia. Soft tissues and spinal canal: No prevertebral fluid or swelling. No visible canal hematoma. Disc levels:  No acute findings. Multilevel degenerative changes. Upper chest: Emphysema. Other: Bilateral carotid bulb calcified plaques. IMPRESSION: 1. No acute intracranial hemorrhage. Age-related atrophy and chronic microvascular ischemic changes. 2. No acute/traumatic cervical spine pathology. 3. No acute facial bone fractures. Electronically Signed   By: Elgie Collard M.D.   On: 10/30/2019 21:38   CT Chest W Contrast  Result Date: 10/30/2019 CLINICAL DATA:  Larey Seat yesterday, found down, hematemesis, syncope EXAM: CT CHEST, ABDOMEN, AND PELVIS WITH CONTRAST TECHNIQUE: Multidetector CT imaging of the chest, abdomen and pelvis was performed following the standard protocol during bolus administration of intravenous contrast.  CONTRAST:  OMNIPAQUE IOHEXOL 300 MG/ML  SOLN COMPARISON:  None. FINDINGS: CT CHEST FINDINGS Cardiovascular: Heart and great vessels are unremarkable without pericardial effusion. Significant atherosclerosis of the aorta and coronary vessels. No evidence of thoracic aortic aneurysm or dissection. Mediastinum/Nodes: There is a large hiatal hernia. 1 cm hypodensity left lobe thyroid does not warrant further follow-up based on size and patient age. Trachea and esophagus are unremarkable. No pathologic adenopathy. Lungs/Pleura: No airspace disease, effusion, or pneumothorax. Central airways are patent. Musculoskeletal: No acute displaced fractures. Rotatory scoliosis of the thoracolumbar spine. Reconstructed images demonstrate no additional findings. CT ABDOMEN PELVIS FINDINGS Hepatobiliary: There are innumerable hepatic  cysts replacing the majority of the left lobe of the liver. Gallbladder is moderately distended small calcified gallstones. No evidence of cholecystitis. Pancreas: Unremarkable. No pancreatic ductal dilatation or surrounding inflammatory changes. Spleen: No splenic injury or perisplenic hematoma. Adrenals/Urinary Tract: The left kidney has been removed in the interim. Large extrarenal pelvis right kidney again seen. No evidence of hydronephrosis. Nonobstructing 7 mm calculus lower pole right kidney. The bladder is grossly unremarkable. The adrenals are not identified. Stomach/Bowel: There is a large hiatal hernia with the majority of the stomach in an intrathoracic position. There is no bowel obstruction or ileus. Diverticulosis of the sigmoid colon without diverticulitis. Vascular/Lymphatic: There is extensive atherosclerosis of the aorta and its distal branches. No pathologic adenopathy. Reproductive: Uterus is atrophic.  No adnexal masses. Other: Heterogeneous free fluid is seen throughout the abdomen and pelvis. Fluid in the right flank is fairly simple in appearance compatible with bland  ascites. Heterogeneous fluid of mixed attenuation within the central abdomen and pelvis extends approximately 16.7 x 10.8 cm in transverse dimension, consistent with intraperitoneal hemorrhage and likely clot. An underlying mesenteric mass would be difficult to exclude delayed imaging through this region does not demonstrate any pooling of contrast to suggest ongoing hemorrhage. There is no free intra-abdominal gas.  No abdominal wall hernia. Musculoskeletal: No acute displaced fractures. Reconstructed images demonstrate no additional findings. IMPRESSION: 1. Heterogeneous process in the central abdomen and pelvis, likely a large mesenteric hematoma. No contrast extravasation to suggest active hemorrhage. Underlying mass in this region cannot be excluded, and appropriate follow-up will be required. 2. No acute intrathoracic trauma. 3. Nonobstructing right renal calculus. The left kidney is not identified and likely surgically absent. 4. Innumerable hepatic cysts. 5. Large hiatal hernia. 6. 1 cm left lobe thyroid nodule. Not clinically significant; no follow-up imaging recommended. (Ref: J Am Coll Radiol. 2015 Feb;12(2): 143-50). 7.  Aortic Atherosclerosis (ICD10-I70.0). These results were called by telephone at the time of interpretation on 10/30/2019 at 9:48 pm to provider [facility] , who verbally acknowledged these results. Electronically Signed   By: Sharlet Salina M.D.   On: 10/30/2019 21:48   CT Cervical Spine Wo Contrast  Result Date: 10/30/2019 CLINICAL DATA:  85 year old female with trauma. EXAM: CT HEAD WITHOUT CONTRAST CT MAXILLOFACIAL WITHOUT CONTRAST CT CERVICAL SPINE WITHOUT CONTRAST TECHNIQUE: Multidetector CT imaging of the head, cervical spine, and maxillofacial structures were performed using the standard protocol without intravenous contrast. Multiplanar CT image reconstructions of the cervical spine and maxillofacial structures were also generated. COMPARISON:  None. FINDINGS: CT HEAD  FINDINGS Brain: Moderate age-related atrophy and chronic microvascular ischemic changes. There is no acute intracranial hemorrhage. No mass effect or midline shift. No extra-axial fluid collection. A 5 mm rounded calcified structure to the right of the anterior falx may represent a small calcified meningioma. Vascular: No hyperdense vessel or unexpected calcification. Skull: Normal. Negative for fracture or focal lesion. Other: None CT MAXILLOFACIAL FINDINGS Osseous: No acute fracture. No mandibular subluxation Orbits: The globes and retro-orbital fat are preserved. Sinuses: Clear. Soft tissues: Negative. CT CERVICAL SPINE FINDINGS Alignment: No acute subluxation. Skull base and vertebrae: No acute fracture. Osteopenia. Soft tissues and spinal canal: No prevertebral fluid or swelling. No visible canal hematoma. Disc levels:  No acute findings. Multilevel degenerative changes. Upper chest: Emphysema. Other: Bilateral carotid bulb calcified plaques. IMPRESSION: 1. No acute intracranial hemorrhage. Age-related atrophy and chronic microvascular ischemic changes. 2. No acute/traumatic cervical spine pathology. 3. No acute facial bone fractures. Electronically Signed  By: Elgie CollardArash  Radparvar M.D.   On: 10/30/2019 21:38   CT ABDOMEN PELVIS W CONTRAST  Result Date: 10/30/2019 CLINICAL DATA:  Larey SeatFell yesterday, found down, hematemesis, syncope EXAM: CT CHEST, ABDOMEN, AND PELVIS WITH CONTRAST TECHNIQUE: Multidetector CT imaging of the chest, abdomen and pelvis was performed following the standard protocol during bolus administration of intravenous contrast. CONTRAST:  100mL OMNIPAQUE IOHEXOL 300 MG/ML  SOLN COMPARISON:  None. FINDINGS: CT CHEST FINDINGS Cardiovascular: Heart and great vessels are unremarkable without pericardial effusion. Significant atherosclerosis of the aorta and coronary vessels. No evidence of thoracic aortic aneurysm or dissection. Mediastinum/Nodes: There is a large hiatal hernia. 1 cm hypodensity left  lobe thyroid does not warrant further follow-up based on size and patient age. Trachea and esophagus are unremarkable. No pathologic adenopathy. Lungs/Pleura: No airspace disease, effusion, or pneumothorax. Central airways are patent. Musculoskeletal: No acute displaced fractures. Rotatory scoliosis of the thoracolumbar spine. Reconstructed images demonstrate no additional findings. CT ABDOMEN PELVIS FINDINGS Hepatobiliary: There are innumerable hepatic cysts replacing the majority of the left lobe of the liver. Gallbladder is moderately distended small calcified gallstones. No evidence of cholecystitis. Pancreas: Unremarkable. No pancreatic ductal dilatation or surrounding inflammatory changes. Spleen: No splenic injury or perisplenic hematoma. Adrenals/Urinary Tract: The left kidney has been removed in the interim. Large extrarenal pelvis right kidney again seen. No evidence of hydronephrosis. Nonobstructing 7 mm calculus lower pole right kidney. The bladder is grossly unremarkable. The adrenals are not identified. Stomach/Bowel: There is a large hiatal hernia with the majority of the stomach in an intrathoracic position. There is no bowel obstruction or ileus. Diverticulosis of the sigmoid colon without diverticulitis. Vascular/Lymphatic: There is extensive atherosclerosis of the aorta and its distal branches. No pathologic adenopathy. Reproductive: Uterus is atrophic.  No adnexal masses. Other: Heterogeneous free fluid is seen throughout the abdomen and pelvis. Fluid in the right flank is fairly simple in appearance compatible with bland ascites. Heterogeneous fluid of mixed attenuation within the central abdomen and pelvis extends approximately 16.7 x 10.8 cm in transverse dimension, consistent with intraperitoneal hemorrhage and likely clot. An underlying mesenteric mass would be difficult to exclude delayed imaging through this region does not demonstrate any pooling of contrast to suggest ongoing hemorrhage.  There is no free intra-abdominal gas.  No abdominal wall hernia. Musculoskeletal: No acute displaced fractures. Reconstructed images demonstrate no additional findings. IMPRESSION: 1. Heterogeneous process in the central abdomen and pelvis, likely a large mesenteric hematoma. No contrast extravasation to suggest active hemorrhage. Underlying mass in this region cannot be excluded, and appropriate follow-up will be required. 2. No acute intrathoracic trauma. 3. Nonobstructing right renal calculus. The left kidney is not identified and likely surgically absent. 4. Innumerable hepatic cysts. 5. Large hiatal hernia. 6. 1 cm left lobe thyroid nodule. Not clinically significant; no follow-up imaging recommended. (Ref: J Am Coll Radiol. 2015 Feb;12(2): 143-50). 7.  Aortic Atherosclerosis (ICD10-I70.0). These results were called by telephone at the time of interpretation on 10/30/2019 at 9:48 pm to provider [facility] , who verbally acknowledged these results. Electronically Signed   By: Sharlet SalinaMichael  Brown M.D.   On: 10/30/2019 21:48   DG Chest Port 1 View  Result Date: 10/30/2019 CLINICAL DATA:  Syncope EXAM: PORTABLE CHEST 1 VIEW COMPARISON:  04/09/2005 FINDINGS: Single frontal view of the chest demonstrates a stable cardiac silhouette. Atherosclerosis of the thoracic aorta. No airspace disease, effusion, or pneumothorax. Stable rotatory scoliosis of the thoracolumbar spine. IMPRESSION: No acute intrathoracic process. Electronically Signed   By: Casimiro NeedleMichael  Manson Passey M.D.   On: 10/30/2019 20:50   ECHOCARDIOGRAM COMPLETE  Result Date: 10/31/2019    ECHOCARDIOGRAM REPORT   Patient Name:   Jacqueline Mathews Date of Exam: 10/31/2019 Medical Rec #:  [id number]        Height:       64.0 in Accession #:    [id number]       Weight:       100.3 lb Date of Birth:  29-May-1935       BSA:          1.459 m Patient Age:    84 years         BP:           128/64 mmHg Patient Gender: F                HR:           105 bpm. Exam Location:   Inpatient Procedure: 2D Echo Indications:    Syncope R55  History:        Patient has no prior history of Echocardiogram examinations.  Sonographer:    Thurman Coyer RDCS (AE) Referring Phys: [id number] Meryle Ready [facility] IMPRESSIONS  1. Left ventricular ejection fraction, by estimation, is 70 to 75%. The left ventricle has hyperdynamic function. The left ventricle has no regional wall motion abnormalities. Left ventricular diastolic parameters are consistent with Grade I diastolic dysfunction (impaired relaxation).  2. Right ventricular systolic function is normal. The right ventricular size is normal. There is mildly elevated pulmonary artery systolic pressure.  3. The mitral valve is normal in structure. No evidence of mitral valve regurgitation. No evidence of mitral stenosis.  4. Tricuspid valve regurgitation is mild to moderate.  5. The aortic valve is normal in structure. Aortic valve regurgitation is not visualized. No aortic stenosis is present.  6. The inferior vena cava is normal in size with greater than 50% respiratory variability, suggesting right atrial pressure of 3 mmHg. FINDINGS  Left Ventricle: Left ventricular ejection fraction, by estimation, is 70 to 75%. The left ventricle has hyperdynamic function. The left ventricle has no regional wall motion abnormalities. The left ventricular internal cavity size was normal in size. There is no left ventricular hypertrophy. Left ventricular diastolic parameters are consistent with Grade I diastolic dysfunction (impaired relaxation). Right Ventricle: The right ventricular size is normal. No increase in right ventricular wall thickness. Right ventricular systolic function is normal. There is mildly elevated pulmonary artery systolic pressure. The tricuspid regurgitant velocity is 3.14  m/s, and with an assumed right atrial pressure of 3 mmHg, the estimated right ventricular systolic pressure is 42.4 mmHg. Left Atrium: Left atrial size was normal in size.  Right Atrium: Right atrial size was normal in size. Pericardium: There is no evidence of pericardial effusion. Mitral Valve: The mitral valve is normal in structure. Normal mobility of the mitral valve leaflets. No evidence of mitral valve regurgitation. No evidence of mitral valve stenosis. Tricuspid Valve: The tricuspid valve is normal in structure. Tricuspid valve regurgitation is mild to moderate. No evidence of tricuspid stenosis. Aortic Valve: The aortic valve is normal in structure. Aortic valve regurgitation is not visualized. No aortic stenosis is present. Pulmonic Valve: The pulmonic valve was normal in structure. Pulmonic valve regurgitation is not visualized. No evidence of pulmonic stenosis. Aorta: The aortic root is normal in size and structure. Venous: The inferior vena cava is normal in size with greater than 50% respiratory variability, suggesting right atrial pressure of 3 mmHg.  IAS/Shunts: No atrial level shunt detected by color flow Doppler.  LEFT VENTRICLE PLAX 2D LVIDd:         3.10 cm  Diastology LVIDs:         2.00 cm  LV e' lateral:   7.83 cm/s LV PW:         0.80 cm  LV E/e' lateral: 12.9 LV IVS:        0.80 cm  LV e' medial:    8.49 cm/s LVOT diam:     1.80 cm  LV E/e' medial:  11.9 LV SV:         66 LV SV Index:   45 LVOT Area:     2.54 cm  RIGHT VENTRICLE RV S prime:     16.00 cm/s TAPSE (M-mode): 1.3 cm LEFT ATRIUM           Index       RIGHT ATRIUM          Index LA diam:      2.40 cm 1.64 cm/m  RA Area:     9.65 cm LA Vol (A2C): 22.1 ml 15.15 ml/m RA Volume:   17.40 ml 11.93 ml/m LA Vol (A4C): 17.2 ml 11.79 ml/m  AORTIC VALVE LVOT Vmax:   139.00 cm/s LVOT Vmean:  87.400 cm/s LVOT VTI:    0.258 m  AORTA Ao Root diam: 2.30 cm MITRAL VALVE                TRICUSPID VALVE MV Area (PHT): 3.06 cm     TR Peak grad:   39.4 mmHg MV Decel Time: 248 msec     TR Vmax:        314.00 cm/s MV E velocity: 101.00 cm/s MV A velocity: 142.00 cm/s  SHUNTS MV E/A ratio:  0.71         Systemic VTI:   0.26 m                             Systemic Diam: 1.80 cm Donato Schultz MD Electronically signed by Donato Schultz MD Signature Date/Time: 10/31/2019/10:55:00 AM    Final    CT Maxillofacial Wo Contrast  Result Date: 10/30/2019 CLINICAL DATA:  85 year old female with trauma. EXAM: CT HEAD WITHOUT CONTRAST CT MAXILLOFACIAL WITHOUT CONTRAST CT CERVICAL SPINE WITHOUT CONTRAST TECHNIQUE: Multidetector CT imaging of the head, cervical spine, and maxillofacial structures were performed using the standard protocol without intravenous contrast. Multiplanar CT image reconstructions of the cervical spine and maxillofacial structures were also generated. COMPARISON:  None. FINDINGS: CT HEAD FINDINGS Brain: Moderate age-related atrophy and chronic microvascular ischemic changes. There is no acute intracranial hemorrhage. No mass effect or midline shift. No extra-axial fluid collection. A 5 mm rounded calcified structure to the right of the anterior falx may represent a small calcified meningioma. Vascular: No hyperdense vessel or unexpected calcification. Skull: Normal. Negative for fracture or focal lesion. Other: None CT MAXILLOFACIAL FINDINGS Osseous: No acute fracture. No mandibular subluxation Orbits: The globes and retro-orbital fat are preserved. Sinuses: Clear. Soft tissues: Negative. CT CERVICAL SPINE FINDINGS Alignment: No acute subluxation. Skull base and vertebrae: No acute fracture. Osteopenia. Soft tissues and spinal canal: No prevertebral fluid or swelling. No visible canal hematoma. Disc levels:  No acute findings. Multilevel degenerative changes. Upper chest: Emphysema. Other: Bilateral carotid bulb calcified plaques. IMPRESSION: 1. No acute intracranial hemorrhage. Age-related atrophy and chronic microvascular ischemic changes. 2. No acute/traumatic cervical spine pathology.  3. No acute facial bone fractures. Electronically Signed   By: Elgie Collard M.D.   On: 10/30/2019 21:38        Scheduled Meds: .  sodium chloride   Intravenous Once  . docusate sodium  100 mg Oral BID  . latanoprost  1 drop Both Eyes QHS  . timolol  1 drop Both Eyes Daily   Continuous Infusions:   LOS: 2 days        Kathlen Mody, MD [facility]   To contact the attending provider between 7A-7P or the covering provider during after hours 7P-7A, please log into the web site [email or web address] and access using universal [place] password for that web site. If you do not have the password, please call the hospital operator.  11/01/2019, 3:56 PM

## 2019-11-01 NOTE — Social Work (Signed)
Tentative prior authorization for SNF has been initiated, [facility] Medicare Ref #[id number].  Octavio Graves, MSW, LCSW [facility] Clinical Social Work

## 2019-11-01 NOTE — Progress Notes (Signed)
OT Cancellation Note  Patient Details Name: JACQUELYNE QUARRY MRN: [id number] DOB: 04-16-1935   Cancelled Treatment:    Reason Eval/Treat Not Completed: Medical issues which prohibited therapy, hemoglobin 6.9 and symptomatic with planned transfusion.  Will follow and see as able.   Barry Brunner, OT Acute Rehabilitation Services Pager [phone] Office [phone]   Chancy Milroy 11/01/2019, 8:26 AM

## 2019-11-01 NOTE — Final Consult Note (Signed)
[facility] Progress Note     Subjective: Patient denies abdominal pain, nausea or vomiting. She reports she had a large BM yesterday. She reports she is urinating, urine in collection canister appears dark (tea-colored). She reports generalized muscle soreness and weakness. Denies chest pain, sob.   Objective: Vital signs in last 24 hours: Temp:  [97.3 F (36.3 C)-98.7 F (37.1 C)] 98.3 F (36.8 C) (05/06 0553) Pulse Rate:  [92-106] 92 (05/06 0553) Resp:  [15-18] 17 (05/06 0553) BP: (118-139)/(70-104) 131/73 (05/06 0553) SpO2:  [65 %-98 %] 93 % (05/06 0553) Last BM Date: 10/31/19  Intake/Output from previous day: 05/05 0701 - 05/06 0700 In: 2117.7 [P.O.:178; I.V.:1624.7; Blood:315] Out: 200 [Urine:200] Intake/Output this shift: No intake/output data recorded.  PE: General: pleasant, WD, cachectic female who is laying in bed in NAD HEENT: head is normocephalic, atraumatic.  Sclera are noninjected.  PERRL.  Ears and nose without any masses or lesions.  Mouth is pink and moist Heart: sinus tachycardia in the low 100s.  Normal s1,s2. No obvious murmurs, gallops, or rubs noted.  Palpable radial and pedal pulses bilaterally Lungs: CTAB, no wheezes, rhonchi, or rales noted.  Respiratory effort nonlabored Abd: soft, NT, ND, +BS, no masses, hernias, or organomegaly MS: all 4 extremities are symmetrical with no cyanosis, clubbing, or edema. Skin: warm and dry with no masses, lesions, or rashes Neuro: Cranial nerves 2-12 grossly intact, sensation grossly intact throughout Psych: A&Ox3 with an appropriate affect.   Lab Results:  Recent Labs    10/31/19 1844 11/01/19 0122  WBC 13.7* 8.4  HGB 7.9* 6.9*  HCT 24.3* 20.9*  PLT 178 122*   BMET Recent Labs    10/30/19 1919 10/30/19 1919 10/30/19 1942 10/31/19 0335  NA 142   < > 142 140  K 4.1   < > 3.9 4.2  CL 109   < > 110 109  CO2 17*  --   --  21*  GLUCOSE 137*   < > 132* 112*  BUN 30*   < > 28* 26*   CREATININE 1.34*   < > 1.00 0.99  CALCIUM 9.0  --   --  8.7*   < > = values in this interval not displayed.   PT/INR Recent Labs    10/30/19 1919  LABPROT 13.0  INR 1.0   CMP     Component Value Date/Time   NA 140 10/31/2019 0335   K 4.2 10/31/2019 0335   CL 109 10/31/2019 0335   CO2 21 (L) 10/31/2019 0335   GLUCOSE 112 (H) 10/31/2019 0335   BUN 26 (H) 10/31/2019 0335   CREATININE 0.99 10/31/2019 0335   CALCIUM 8.7 (L) 10/31/2019 0335   PROT 5.6 (L) 10/30/2019 1919   ALBUMIN 3.0 (L) 10/30/2019 1919   AST 47 (H) 10/30/2019 1919   ALT 21 10/30/2019 1919   ALKPHOS 47 10/30/2019 1919   BILITOT 1.3 (H) 10/30/2019 1919   GFRNONAA 53 (L) 10/31/2019 0335   GFRAA >60 10/31/2019 0335   Lipase     Component Value Date/Time   LIPASE 18 10/30/2019 1919       Studies/Results: DG Pelvis 1-2 Views  Result Date: 10/30/2019 CLINICAL DATA:  Syncope EXAM: PELVIS - 1-2 VIEW COMPARISON:  None. FINDINGS: Supine frontal view of the pelvis excludes portions of the proximal left femur. No acute displaced fracture. Symmetrical bilateral hip osteoarthritis. Left convex scoliosis of the lumbar spine. Sacroiliac joints are normal. IMPRESSION: 1. No acute displaced fracture. Electronically Signed  By: Sharlet Salina M.D.   On: 10/30/2019 20:51   CT Head Wo Contrast  Result Date: 10/30/2019 CLINICAL DATA:  85 year old female with trauma. EXAM: CT HEAD WITHOUT CONTRAST CT MAXILLOFACIAL WITHOUT CONTRAST CT CERVICAL SPINE WITHOUT CONTRAST TECHNIQUE: Multidetector CT imaging of the head, cervical spine, and maxillofacial structures were performed using the standard protocol without intravenous contrast. Multiplanar CT image reconstructions of the cervical spine and maxillofacial structures were also generated. COMPARISON:  None. FINDINGS: CT HEAD FINDINGS Brain: Moderate age-related atrophy and chronic microvascular ischemic changes. There is no acute intracranial hemorrhage. No mass effect or midline  shift. No extra-axial fluid collection. A 5 mm rounded calcified structure to the right of the anterior falx may represent a small calcified meningioma. Vascular: No hyperdense vessel or unexpected calcification. Skull: Normal. Negative for fracture or focal lesion. Other: None CT MAXILLOFACIAL FINDINGS Osseous: No acute fracture. No mandibular subluxation Orbits: The globes and retro-orbital fat are preserved. Sinuses: Clear. Soft tissues: Negative. CT CERVICAL SPINE FINDINGS Alignment: No acute subluxation. Skull base and vertebrae: No acute fracture. Osteopenia. Soft tissues and spinal canal: No prevertebral fluid or swelling. No visible canal hematoma. Disc levels:  No acute findings. Multilevel degenerative changes. Upper chest: Emphysema. Other: Bilateral carotid bulb calcified plaques. IMPRESSION: 1. No acute intracranial hemorrhage. Age-related atrophy and chronic microvascular ischemic changes. 2. No acute/traumatic cervical spine pathology. 3. No acute facial bone fractures. Electronically Signed   By: Elgie Collard M.D.   On: 10/30/2019 21:38   CT Chest W Contrast  Result Date: 10/30/2019 CLINICAL DATA:  Larey Seat yesterday, found down, hematemesis, syncope EXAM: CT CHEST, ABDOMEN, AND PELVIS WITH CONTRAST TECHNIQUE: Multidetector CT imaging of the chest, abdomen and pelvis was performed following the standard protocol during bolus administration of intravenous contrast. CONTRAST:  OMNIPAQUE IOHEXOL 300 MG/ML  SOLN COMPARISON:  None. FINDINGS: CT CHEST FINDINGS Cardiovascular: Heart and great vessels are unremarkable without pericardial effusion. Significant atherosclerosis of the aorta and coronary vessels. No evidence of thoracic aortic aneurysm or dissection. Mediastinum/Nodes: There is a large hiatal hernia. 1 cm hypodensity left lobe thyroid does not warrant further follow-up based on size and patient age. Trachea and esophagus are unremarkable. No pathologic adenopathy. Lungs/Pleura: No  airspace disease, effusion, or pneumothorax. Central airways are patent. Musculoskeletal: No acute displaced fractures. Rotatory scoliosis of the thoracolumbar spine. Reconstructed images demonstrate no additional findings. CT ABDOMEN PELVIS FINDINGS Hepatobiliary: There are innumerable hepatic cysts replacing the majority of the left lobe of the liver. Gallbladder is moderately distended small calcified gallstones. No evidence of cholecystitis. Pancreas: Unremarkable. No pancreatic ductal dilatation or surrounding inflammatory changes. Spleen: No splenic injury or perisplenic hematoma. Adrenals/Urinary Tract: The left kidney has been removed in the interim. Large extrarenal pelvis right kidney again seen. No evidence of hydronephrosis. Nonobstructing 7 mm calculus lower pole right kidney. The bladder is grossly unremarkable. The adrenals are not identified. Stomach/Bowel: There is a large hiatal hernia with the majority of the stomach in an intrathoracic position. There is no bowel obstruction or ileus. Diverticulosis of the sigmoid colon without diverticulitis. Vascular/Lymphatic: There is extensive atherosclerosis of the aorta and its distal branches. No pathologic adenopathy. Reproductive: Uterus is atrophic.  No adnexal masses. Other: Heterogeneous free fluid is seen throughout the abdomen and pelvis. Fluid in the right flank is fairly simple in appearance compatible with bland ascites. Heterogeneous fluid of mixed attenuation within the central abdomen and pelvis extends approximately 16.7 x 10.8 cm in transverse dimension, consistent with intraperitoneal hemorrhage and likely  clot. An underlying mesenteric mass would be difficult to exclude delayed imaging through this region does not demonstrate any pooling of contrast to suggest ongoing hemorrhage. There is no free intra-abdominal gas.  No abdominal wall hernia. Musculoskeletal: No acute displaced fractures. Reconstructed images demonstrate no additional  findings. IMPRESSION: 1. Heterogeneous process in the central abdomen and pelvis, likely a large mesenteric hematoma. No contrast extravasation to suggest active hemorrhage. Underlying mass in this region cannot be excluded, and appropriate follow-up will be required. 2. No acute intrathoracic trauma. 3. Nonobstructing right renal calculus. The left kidney is not identified and likely surgically absent. 4. Innumerable hepatic cysts. 5. Large hiatal hernia. 6. 1 cm left lobe thyroid nodule. Not clinically significant; no follow-up imaging recommended. (Ref: J Am Coll Radiol. 2015 Feb;12(2): 143-50). 7.  Aortic Atherosclerosis (ICD10-I70.0). These results were called by telephone at the time of interpretation on 10/30/2019 at 9:48 pm to provider [facility] , who verbally acknowledged these results. Electronically Signed   By: Sharlet Salina M.D.   On: 10/30/2019 21:48   CT Cervical Spine Wo Contrast  Result Date: 10/30/2019 CLINICAL DATA:  85 year old female with trauma. EXAM: CT HEAD WITHOUT CONTRAST CT MAXILLOFACIAL WITHOUT CONTRAST CT CERVICAL SPINE WITHOUT CONTRAST TECHNIQUE: Multidetector CT imaging of the head, cervical spine, and maxillofacial structures were performed using the standard protocol without intravenous contrast. Multiplanar CT image reconstructions of the cervical spine and maxillofacial structures were also generated. COMPARISON:  None. FINDINGS: CT HEAD FINDINGS Brain: Moderate age-related atrophy and chronic microvascular ischemic changes. There is no acute intracranial hemorrhage. No mass effect or midline shift. No extra-axial fluid collection. A 5 mm rounded calcified structure to the right of the anterior falx may represent a small calcified meningioma. Vascular: No hyperdense vessel or unexpected calcification. Skull: Normal. Negative for fracture or focal lesion. Other: None CT MAXILLOFACIAL FINDINGS Osseous: No acute fracture. No mandibular subluxation Orbits: The globes and  retro-orbital fat are preserved. Sinuses: Clear. Soft tissues: Negative. CT CERVICAL SPINE FINDINGS Alignment: No acute subluxation. Skull base and vertebrae: No acute fracture. Osteopenia. Soft tissues and spinal canal: No prevertebral fluid or swelling. No visible canal hematoma. Disc levels:  No acute findings. Multilevel degenerative changes. Upper chest: Emphysema. Other: Bilateral carotid bulb calcified plaques. IMPRESSION: 1. No acute intracranial hemorrhage. Age-related atrophy and chronic microvascular ischemic changes. 2. No acute/traumatic cervical spine pathology. 3. No acute facial bone fractures. Electronically Signed   By: Elgie Collard M.D.   On: 10/30/2019 21:38   CT ABDOMEN PELVIS W CONTRAST  Result Date: 10/30/2019 CLINICAL DATA:  Larey Seat yesterday, found down, hematemesis, syncope EXAM: CT CHEST, ABDOMEN, AND PELVIS WITH CONTRAST TECHNIQUE: Multidetector CT imaging of the chest, abdomen and pelvis was performed following the standard protocol during bolus administration of intravenous contrast. CONTRAST:  OMNIPAQUE IOHEXOL 300 MG/ML  SOLN COMPARISON:  None. FINDINGS: CT CHEST FINDINGS Cardiovascular: Heart and great vessels are unremarkable without pericardial effusion. Significant atherosclerosis of the aorta and coronary vessels. No evidence of thoracic aortic aneurysm or dissection. Mediastinum/Nodes: There is a large hiatal hernia. 1 cm hypodensity left lobe thyroid does not warrant further follow-up based on size and patient age. Trachea and esophagus are unremarkable. No pathologic adenopathy. Lungs/Pleura: No airspace disease, effusion, or pneumothorax. Central airways are patent. Musculoskeletal: No acute displaced fractures. Rotatory scoliosis of the thoracolumbar spine. Reconstructed images demonstrate no additional findings. CT ABDOMEN PELVIS FINDINGS Hepatobiliary: There are innumerable hepatic cysts replacing the majority of the left lobe of the liver. Gallbladder is  moderately distended small calcified gallstones. No evidence of cholecystitis. Pancreas: Unremarkable. No pancreatic ductal dilatation or surrounding inflammatory changes. Spleen: No splenic injury or perisplenic hematoma. Adrenals/Urinary Tract: The left kidney has been removed in the interim. Large extrarenal pelvis right kidney again seen. No evidence of hydronephrosis. Nonobstructing 7 mm calculus lower pole right kidney. The bladder is grossly unremarkable. The adrenals are not identified. Stomach/Bowel: There is a large hiatal hernia with the majority of the stomach in an intrathoracic position. There is no bowel obstruction or ileus. Diverticulosis of the sigmoid colon without diverticulitis. Vascular/Lymphatic: There is extensive atherosclerosis of the aorta and its distal branches. No pathologic adenopathy. Reproductive: Uterus is atrophic.  No adnexal masses. Other: Heterogeneous free fluid is seen throughout the abdomen and pelvis. Fluid in the right flank is fairly simple in appearance compatible with bland ascites. Heterogeneous fluid of mixed attenuation within the central abdomen and pelvis extends approximately 16.7 x 10.8 cm in transverse dimension, consistent with intraperitoneal hemorrhage and likely clot. An underlying mesenteric mass would be difficult to exclude delayed imaging through this region does not demonstrate any pooling of contrast to suggest ongoing hemorrhage. There is no free intra-abdominal gas.  No abdominal wall hernia. Musculoskeletal: No acute displaced fractures. Reconstructed images demonstrate no additional findings. IMPRESSION: 1. Heterogeneous process in the central abdomen and pelvis, likely a large mesenteric hematoma. No contrast extravasation to suggest active hemorrhage. Underlying mass in this region cannot be excluded, and appropriate follow-up will be required. 2. No acute intrathoracic trauma. 3. Nonobstructing right renal calculus. The left kidney is not  identified and likely surgically absent. 4. Innumerable hepatic cysts. 5. Large hiatal hernia. 6. 1 cm left lobe thyroid nodule. Not clinically significant; no follow-up imaging recommended. (Ref: J Am Coll Radiol. 2015 Feb;12(2): 143-50). 7.  Aortic Atherosclerosis (ICD10-I70.0). These results were called by telephone at the time of interpretation on 10/30/2019 at 9:48 pm to provider [facility] , who verbally acknowledged these results. Electronically Signed   By: Sharlet Salina M.D.   On: 10/30/2019 21:48   DG Chest Port 1 View  Result Date: 10/30/2019 CLINICAL DATA:  Syncope EXAM: PORTABLE CHEST 1 VIEW COMPARISON:  04/09/2005 FINDINGS: Single frontal view of the chest demonstrates a stable cardiac silhouette. Atherosclerosis of the thoracic aorta. No airspace disease, effusion, or pneumothorax. Stable rotatory scoliosis of the thoracolumbar spine. IMPRESSION: No acute intrathoracic process. Electronically Signed   By: Sharlet Salina M.D.   On: 10/30/2019 20:50   ECHOCARDIOGRAM COMPLETE  Result Date: 10/31/2019    ECHOCARDIOGRAM REPORT   Patient Name:   Jacqueline Mathews Date of Exam: 10/31/2019 Medical Rec #:  [id number]        Height:       64.0 in Accession #:    [id number]       Weight:       100.3 lb Date of Birth:  1934-10-23       BSA:          1.459 m Patient Age:    84 years         BP:           128/64 mmHg Patient Gender: F                HR:           105 bpm. Exam Location:  Inpatient Procedure: 2D Echo Indications:    Syncope R55  History:        Patient has no prior history  of Echocardiogram examinations.  Sonographer:    Thurman Coyer RDCS (AE) Referring Phys: [id number] [address] Ready [facility] IMPRESSIONS  1. Left ventricular ejection fraction, by estimation, is 70 to 75%. The left ventricle has hyperdynamic function. The left ventricle has no regional wall motion abnormalities. Left ventricular diastolic parameters are consistent with Grade I diastolic dysfunction (impaired relaxation).  2. Right  ventricular systolic function is normal. The right ventricular size is normal. There is mildly elevated pulmonary artery systolic pressure.  3. The mitral valve is normal in structure. No evidence of mitral valve regurgitation. No evidence of mitral stenosis.  4. Tricuspid valve regurgitation is mild to moderate.  5. The aortic valve is normal in structure. Aortic valve regurgitation is not visualized. No aortic stenosis is present.  6. The inferior vena cava is normal in size with greater than 50% respiratory variability, suggesting right atrial pressure of 3 mmHg. FINDINGS  Left Ventricle: Left ventricular ejection fraction, by estimation, is 70 to 75%. The left ventricle has hyperdynamic function. The left ventricle has no regional wall motion abnormalities. The left ventricular internal cavity size was normal in size. There is no left ventricular hypertrophy. Left ventricular diastolic parameters are consistent with Grade I diastolic dysfunction (impaired relaxation). Right Ventricle: The right ventricular size is normal. No increase in right ventricular wall thickness. Right ventricular systolic function is normal. There is mildly elevated pulmonary artery systolic pressure. The tricuspid regurgitant velocity is 3.14  m/s, and with an assumed right atrial pressure of 3 mmHg, the estimated right ventricular systolic pressure is 42.4 mmHg. Left Atrium: Left atrial size was normal in size. Right Atrium: Right atrial size was normal in size. Pericardium: There is no evidence of pericardial effusion. Mitral Valve: The mitral valve is normal in structure. Normal mobility of the mitral valve leaflets. No evidence of mitral valve regurgitation. No evidence of mitral valve stenosis. Tricuspid Valve: The tricuspid valve is normal in structure. Tricuspid valve regurgitation is mild to moderate. No evidence of tricuspid stenosis. Aortic Valve: The aortic valve is normal in structure. Aortic valve regurgitation is not  visualized. No aortic stenosis is present. Pulmonic Valve: The pulmonic valve was normal in structure. Pulmonic valve regurgitation is not visualized. No evidence of pulmonic stenosis. Aorta: The aortic root is normal in size and structure. Venous: The inferior vena cava is normal in size with greater than 50% respiratory variability, suggesting right atrial pressure of 3 mmHg. IAS/Shunts: No atrial level shunt detected by color flow Doppler.  LEFT VENTRICLE PLAX 2D LVIDd:         3.10 cm  Diastology LVIDs:         2.00 cm  LV e' lateral:   7.83 cm/s LV PW:         0.80 cm  LV E/e' lateral: 12.9 LV IVS:        0.80 cm  LV e' medial:    8.49 cm/s LVOT diam:     1.80 cm  LV E/e' medial:  11.9 LV SV:         66 LV SV Index:   45 LVOT Area:     2.54 cm  RIGHT VENTRICLE RV S prime:     16.00 cm/s TAPSE (M-mode): 1.3 cm LEFT ATRIUM           Index       RIGHT ATRIUM          Index LA diam:      2.40 cm 1.64 cm/m  RA Area:  9.65 cm LA Vol (A2C): 22.1 ml 15.15 ml/m RA Volume:   17.40 ml 11.93 ml/m LA Vol (A4C): 17.2 ml 11.79 ml/m  AORTIC VALVE LVOT Vmax:   139.00 cm/s LVOT Vmean:  87.400 cm/s LVOT VTI:    0.258 m  AORTA Ao Root diam: 2.30 cm MITRAL VALVE                TRICUSPID VALVE MV Area (PHT): 3.06 cm     TR Peak grad:   39.4 mmHg MV Decel Time: 248 msec     TR Vmax:        314.00 cm/s MV E velocity: 101.00 cm/s MV A velocity: 142.00 cm/s  SHUNTS MV E/A ratio:  0.71         Systemic VTI:  0.26 m                             Systemic Diam: 1.80 cm Donato SchultzMark Skains MD Electronically signed by Donato SchultzMark Skains MD Signature Date/Time: 10/31/2019/10:55:00 AM    Final    CT Maxillofacial Wo Contrast  Result Date: 10/30/2019 CLINICAL DATA:  [age] year old female with trauma. EXAM: CT HEAD WITHOUT CONTRAST CT MAXILLOFACIAL WITHOUT CONTRAST CT CERVICAL SPINE WITHOUT CONTRAST TECHNIQUE: Multidetector CT imaging of the head, cervical spine, and maxillofacial structures were performed using the standard protocol without intravenous  contrast. Multiplanar CT image reconstructions of the cervical spine and maxillofacial structures were also generated. COMPARISON:  None. FINDINGS: CT HEAD FINDINGS Brain: Moderate age-related atrophy and chronic microvascular ischemic changes. There is no acute intracranial hemorrhage. No mass effect or midline shift. No extra-axial fluid collection. A 5 mm rounded calcified structure to the right of the anterior falx may represent a small calcified meningioma. Vascular: No hyperdense vessel or unexpected calcification. Skull: Normal. Negative for fracture or focal lesion. Other: None CT MAXILLOFACIAL FINDINGS Osseous: No acute fracture. No mandibular subluxation Orbits: The globes and retro-orbital fat are preserved. Sinuses: Clear. Soft tissues: Negative. CT CERVICAL SPINE FINDINGS Alignment: No acute subluxation. Skull base and vertebrae: No acute fracture. Osteopenia. Soft tissues and spinal canal: No prevertebral fluid or swelling. No visible canal hematoma. Disc levels:  No acute findings. Multilevel degenerative changes. Upper chest: Emphysema. Other: Bilateral carotid bulb calcified plaques. IMPRESSION: 1. No acute intracranial hemorrhage. Age-related atrophy and chronic microvascular ischemic changes. 2. No acute/traumatic cervical spine pathology. 3. No acute facial bone fractures. Electronically Signed   By: Elgie CollardArash  Radparvar M.D.   On: 10/30/2019 21:38    Anti-infectives: Anti-infectives (From admission, onward)   Start     Dose/Rate Route Frequency Ordered Stop   10/30/19 2230  metroNIDAZOLE (FLAGYL) IVPB 500 mg     500 mg 100 mL/hr over 60 Minutes Intravenous  Once 10/30/19 2216 10/31/19 0019   10/30/19 2230  ciprofloxacin (CIPRO) IVPB 400 mg     400 mg 200 mL/hr over 60 Minutes Intravenous  Once 10/30/19 2216 10/31/19 0019       Assessment/Plan Fall on 5/3 and down for extended time ?perinephric hematoma/renal injury vs mesenteric hematoma  - urology feels this is more likely a renal  injury and this seems a reasonable explanation - urology recommending bedrest and serial hgb - abdominal exam completely benign and patient having bowel function ABL anemia - hgb 6.9, transfusion per primary team  Multiple liver cysts - OP workup per PCP Rhabdomyolysis - CK elevated at 1,209 yesterday and Cr. 0.99 yesterday, not checked yet today although labs are  ordered. Urine appeared dark this AM and patient complaining of muscle soreness  FEN: ok to advance to soft diet  VTE: SCDs, no chemical VTE in setting of ABL anemia  ID: cipro/flagyl   No other recommendations from a trauma surgery standpoint. Management of likely perinephric hematoma per urology. Trauma will sign off, please call if we can be of further assistance.   LOS: 2 days    Juliet Rude , [facility] Surgery 11/01/2019, 9:15 AM Please see Amion for pager number during day hours 7:00am-4:30pm

## 2019-11-01 NOTE — TOC Progression Note (Signed)
Transition of Care [facility]) - Progression Note    Patient Details  Name: Jacqueline Mathews MRN: [id number] Date of Birth: 09/09/1934  Transition of Care [facility] CM/SW [place],  Phone Number: 11/01/2019, 12:43 PM  Clinical Narrative:    CSW met with pt at bedside. Pt brother also present, both are aware of recommendations of SNF. Although pt hesitant pt brother has encouraged her to consider SNF and she gives permission for referral to be faxed out.   CSW will send referral and begin auth for SNF placement tentatively.      Expected Discharge Plan: [place] Barriers to Discharge: Continued Medical Work up, Ship broker  Expected Discharge Plan and Services Expected Discharge Plan: [place] In-house Referral: Clinical Social Work Discharge Planning Services: CM Consult Post Acute Care Choice: Durable Medical Equipment Living arrangements for the past 2 months: Single Family Home  Readmission Risk Interventions Readmission Risk Prevention Plan 10/31/2019  Post Dischage Appt Not Complete  Appt Comments pt PCP retired  Medication Screening Complete  Transportation Screening Complete  Some recent data might be hidden

## 2019-11-01 NOTE — Progress Notes (Addendum)
CRITICAL VALUE ALERT  Critical Value:  Hgb=6.9  Date & Time Notied:  10/31/148  Provider Notified: Dr. Judie Petit. Katherina Right  Orders Received/Actions taken: Awaiting orders/reply  Received order to transfuse 1 unit PRBC and place H & H order after blood transfusion at appropriate time

## 2019-11-01 NOTE — Progress Notes (Signed)
PT Cancellation Note  Patient Details Name: Jacqueline Mathews MRN: [id number] DOB: 10/04/1934   Cancelled Treatment:    Reason Eval/Treat Not Completed: Patient not medically ready. Per trauma note, "urology recommending bedrest and serial hgb." Will follow-up for PT treatment as appropriate.  Ina Homes, PT, DPT Acute Rehabilitation Services  Pager [phone] Office [phone]  Malachy Chamber 11/01/2019, 12:10 PM

## 2019-11-02 DIAGNOSIS — R55 Syncope and collapse: Secondary | ICD-10-CM

## 2019-11-02 LAB — CBC
HCT: 29.5 % — ABNORMAL LOW (ref 36.0–46.0)
Hemoglobin: 10.1 g/dL — ABNORMAL LOW (ref 12.0–15.0)
MCH: 31.4 pg (ref 26.0–34.0)
MCHC: 34.2 g/dL (ref 30.0–36.0)
MCV: 91.6 fL (ref 80.0–100.0)
Platelets: 135 10*3/uL — ABNORMAL LOW (ref 150–400)
RBC: 3.22 MIL/uL — ABNORMAL LOW (ref 3.87–5.11)
RDW: 16 % — ABNORMAL HIGH (ref 11.5–15.5)
WBC: 9.5 10*3/uL (ref 4.0–10.5)
nRBC: 0 % (ref 0.0–0.2)

## 2019-11-02 LAB — COMPREHENSIVE METABOLIC PANEL
ALT: 26 U/L (ref 0–44)
AST: 30 U/L (ref 15–41)
Albumin: 2.3 g/dL — ABNORMAL LOW (ref 3.5–5.0)
Alkaline Phosphatase: 41 U/L (ref 38–126)
Anion gap: 6 (ref 5–15)
BUN: 9 mg/dL (ref 8–23)
CO2: 22 mmol/L (ref 22–32)
Calcium: 8.3 mg/dL — ABNORMAL LOW (ref 8.9–10.3)
Chloride: 112 mmol/L — ABNORMAL HIGH (ref 98–111)
Creatinine, Ser: 0.68 mg/dL (ref 0.44–1.00)
GFR calc Af Amer: >60 mL/min (ref >60)
GFR calc non Af Amer: >60 mL/min (ref >60)
Glucose, Bld: 106 mg/dL — ABNORMAL HIGH (ref 70–99)
Potassium: 3.6 mmol/L (ref 3.5–5.1)
Sodium: 140 mmol/L (ref 135–145)
Total Bilirubin: 1.1 mg/dL (ref 0.3–1.2)
Total Protein: 5.1 g/dL — ABNORMAL LOW (ref 6.5–8.1)

## 2019-11-02 MED ORDER — TRAMADOL HCL 50 MG PO TABS
50.0000 mg | ORAL_TABLET | Freq: Four times a day (QID) | ORAL | Status: DC | PRN
  Administered 2019-11-02 – 2019-11-03 (×3): 50 mg via ORAL
  Filled 2019-11-02 (×3): qty 1

## 2019-11-02 NOTE — TOC Progression Note (Signed)
Transition of Care [facility]) - Progression Note    Patient Details  Name: GOLDA ZAVALZA MRN: [id number] Date of Birth: 11-11-1934  Transition of Care [facility]) CM/SW Contact  Doy Hutching, [place] Phone Number: 11/02/2019, 3:37 PM  Clinical Narrative:    CSW received a call from pt daughter with choice of [facility].   Auth received for pt to admit to [facility] when medically stable.  Ref# [id number], approved 5/7-5/11. Pt will need a new COVID within 24-48 hrs before discharge.    Expected Discharge Plan: Skilled Nursing Facility Barriers to Discharge: Continued Medical Work up  Expected Discharge Plan and Services Expected Discharge Plan: Skilled Nursing Facility In-house Referral: Clinical Social Work Discharge Planning Services: CM Consult Post Acute Care Choice: Durable Medical Equipment Living arrangements for the past 2 months: Single Family Home  Readmission Risk Interventions Readmission Risk Prevention Plan 10/31/2019  Post Dischage Appt Not Complete  Appt Comments pt PCP retired  Medication Screening Complete  Transportation Screening Complete  Some recent data might be hidden

## 2019-11-02 NOTE — TOC Progression Note (Signed)
Transition of Care [facility]) - Progression Note    Patient Details  Name: Jacqueline Mathews MRN: [id number] Date of Birth: [birth year]  Transition of Care [facility]) CM/SW Contact  Doy Hutching, [place] Phone Number: 11/02/2019, 2:45 PM  Clinical Narrative:    Pt provided with offers for SNF placement. Pt daughter and pt brother both at bedside. They were given CMS ratings list and this writers number should they have any questions or a decision. Pt insurance has been tentatively approved pending SNF decision.    Expected Discharge Plan: Skilled Nursing Facility Barriers to Discharge: Continued Medical Work up, English as a second language teacher  Expected Discharge Plan and Services Expected Discharge Plan: Skilled Nursing Facility In-house Referral: Clinical Social Work Discharge Planning Services: CM Consult Post Acute Care Choice: Durable Medical Equipment Living arrangements for the past 2 months: Single Family Home [place]    Readmission Risk Interventions Readmission Risk Prevention Plan 10/31/2019  Post Dischage Appt Not Complete  Appt Comments pt PCP retired  Medication Screening Complete  Transportation Screening Complete  Some recent data might be hidden

## 2019-11-02 NOTE — Progress Notes (Signed)
PROGRESS NOTE    Jacqueline Mathews  XNT:[id number] DOB: 1934-12-03 DOA: 10/30/2019 PCP: Gordy Savers, MD    Chief Complaint  Patient presents with  . Near Syncope    Brief Narrative:   85 year old lady with no sig history presents with a mechanical fall, syncope and mild rhabdomyolysis. . Ct head and cervical spine is unremarkable.  CT abd and pelvis shows large mesenteric hematoma for which trauma surgeon consulted and recommendations.  urology consulted , suggested non functional left kidney,  Probably hematoma from possible renal injury from the remaninig tissue vs disruption of the ureteropelvic junction. Recommended to treat this as a perinephric hematoma/ renal injury with bedrest and serial hemoglobin. Trauma surgery consulted and no further recommendations from surgery standpoint.   Assessment & Plan:   Principal Problem:   Mesenteric hematoma Active Problems:   ANEMIA, SECONDARY TO CHRONIC BLOOD LOSS   Syncope and collapse   Intraabdominal hemorrhage  Syncope: Orthostatic vital signs are negative.  Echocardiogram unremarkable, showed hyperdynamic EF and grade 1 diastolic dysfunction.  No arrhythmias on telemetry.  Pt denies any nausea or dizziness.    Mesenteric hematoma/ perinephric hematoma:  posisbly from the mechanical fall.  Transfused 1 unit of prbc transfusion, repeat H&H IS greater than 9.  Repeat hemoglobin is round 10.  Pt reports she is still very sore all over the body.    Anemia of blood loss from mechanical fall Transfuse to keep hemoglobin greater than 7.    Lactic acidosis from hypotension Resolved.    AKI:  Resolved.    Mild rhabdomyolysis:  Much improved from IV fluids. Advanced diet to regular diet.   Elevated BP parameters. No h/o hypertension.  Probably secondary to pains.   Prn hydralazine ordered.   DVT prophylaxis: scd's Code Status:  Full code.  Family Communication: family at bedside.  Disposition:   Status is:  Inpatient  Remains inpatient appropriate because:Unsafe d/c plan   Dispo: The patient is from: Home              Anticipated d/c is to: SNF              Anticipated d/c date is: 1 day              Patient currently is medically stable to d/c.        Consultants:   Urology  Trauma Surgery.      Procedures: none.    Antimicrobials: none.    Subjective: Pt reports feeling sore all over.   Objective: Vitals:   11/01/19 0800 11/01/19 2047 11/02/19 0452 11/02/19 1628  BP: 140/70 (!) 149/90 (!) 157/76 (!) 174/89  Pulse: 80 94 99 81  Resp: 18 18 17 20   Temp: 98.4 F (36.9 C) 98 F (36.7 C) 98.6 F (37 C) 98 F (36.7 C)  TempSrc: Oral Oral Oral Oral  SpO2: 94% 92% 92% 95%  Weight:      Height:        Intake/Output Summary (Last 24 hours) at 11/02/2019 1632 Last data filed at 11/02/2019 1100 Gross per 24 hour  Intake 120 ml  Output 450 ml  Net -330 ml   Filed Weights   10/31/19 0252  Weight: 45.5 kg    Examination:  General exam: alert and comfortable. No distress noted.  Respiratory system: Clear to auscultation bilaterally, no wheezing or rhonchi Cardiovascular system: S1-S2 heard, regular rate rhythm, no JVD, no pedal edema Gastrointestinal system: Abdomen is soft, mild generalized tenderness,  bowel sounds normal nondistended. Central nervous system: Alert and oriented, grossly nonfocal.  Extremities: No pedal edema Skin: No rashes seen Psychiatry: flat affect.     Data Reviewed: I have personally reviewed following labs and imaging studies  CBC: Recent Labs  Lab 10/30/19 1919 10/30/19 1942 10/31/19 1150 10/31/19 1150 10/31/19 1844 11/01/19 0122 11/01/19 0909 11/01/19 1542 11/02/19 0320  WBC 18.2*   < > 13.4*  --  13.7* 8.4  --  8.3 9.5  NEUTROABS 16.0*  --   --   --   --   --   --   --   --   HGB 9.8*   < > 7.8*   < > 7.9* 6.9* 9.5* 10.0* 10.1*  HCT 29.2*   < > 23.7*   < > 24.3* 20.9* 28.9* 29.9* 29.5*  MCV 95.7   < > 97.1  --  97.6  96.8  --  92.3 91.6  PLT 233   < > 154  --  178 122*  --  151 135*   < > = values in this interval not displayed.    Basic Metabolic Panel: Recent Labs  Lab 10/30/19 1919 10/30/19 1942 10/31/19 0335 11/01/19 0909 11/02/19 0320  NA 142 142 140 140 140  K 4.1 3.9 4.2 3.8 3.6  CL 109 110 109 113* 112*  CO2 17*  --  21* 24 22  GLUCOSE 137* 132* 112* 113* 106*  BUN 30* 28* 26* 14 9  CREATININE 1.34* 1.00 0.99 0.62 0.68  CALCIUM 9.0  --  8.7* 8.4* 8.3*    GFR: Estimated Creatinine Clearance: 38.9 mL/min (by C-G formula based on SCr of 0.68 mg/dL).  Liver Function Tests: Recent Labs  Lab 10/30/19 1919 11/02/19 0320  AST 47* 30  ALT 21 26  ALKPHOS 47 41  BILITOT 1.3* 1.1  PROT 5.6* 5.1*  ALBUMIN 3.0* 2.3*    CBG: No results for input(s): GLUCAP in the last 168 hours.   Recent Results (from the past 240 hour(s))  Respiratory Panel by RT PCR (Flu A&B, Covid) - Nasopharyngeal Swab     Status: None   Collection Time: 10/30/19 10:14 PM   Specimen: Nasopharyngeal Swab  Result Value Ref Range Status   SARS Coronavirus 2 by RT PCR NEGATIVE NEGATIVE Final    Comment: (NOTE) SARS-CoV-2 target nucleic acids are NOT DETECTED. The SARS-CoV-2 RNA is generally detectable in upper respiratoy specimens during the acute phase of infection. The lowest concentration of SARS-CoV-2 viral copies this assay can detect is 131 copies/mL. A negative result does not preclude SARS-Cov-2 infection and should not be used as the sole basis for treatment or other patient management decisions. A negative result may occur with  improper specimen collection/handling, submission of specimen other than nasopharyngeal swab, presence of viral mutation(s) within the areas targeted by this assay, and inadequate number of viral copies (<131 copies/mL). A negative result must be combined with clinical observations, patient history, and epidemiological information. The expected result is Negative. Fact  Sheet for Patients:  https://www.moore.com/ Fact Sheet for Healthcare Providers:  https://www.young.biz/ This test is not yet ap proved or cleared by the [place] FDA and  has been authorized for detection and/or diagnosis of SARS-CoV-2 by FDA under an Emergency Use Authorization (EUA). This EUA will remain  in effect (meaning this test can be used) for the duration of the COVID-19 declaration under Section 564(b)(1) of the Act, 21 U.S.C. section 360bbb-3(b)(1), unless the authorization is terminated or revoked sooner.  Influenza A by PCR NEGATIVE NEGATIVE Final   Influenza B by PCR NEGATIVE NEGATIVE Final    Comment: (NOTE) The Xpert Xpress SARS-CoV-2/FLU/RSV assay is intended as an aid in  the diagnosis of influenza from Nasopharyngeal swab specimens and  should not be used as a sole basis for treatment. Nasal washings and  aspirates are unacceptable for Xpert Xpress SARS-CoV-2/FLU/RSV  testing. Fact Sheet for Patients: PinkCheek.be Fact Sheet for Healthcare Providers: GravelBags.it This test is not yet approved or cleared by the [place] FDA and  has been authorized for detection and/or diagnosis of SARS-CoV-2 by  FDA under an Emergency Use Authorization (EUA). This EUA will remain  in effect (meaning this test can be used) for the duration of the  Covid-19 declaration under Section 564(b)(1) of the Act, 21  U.S.C. section 360bbb-3(b)(1), unless the authorization is  terminated or revoked. Performed at [facility], [place] [address] [place], [place] [address]          Radiology Studies: No results found.      Scheduled Meds: . sodium chloride   Intravenous Once  . docusate sodium  100 mg Oral BID  . latanoprost  1 drop Both Eyes QHS  . timolol  1 drop Both Eyes Daily   Continuous Infusions:   LOS: 3 days        Hosie Poisson, MD [facility]   To contact the attending provider between 7A-7P or the covering provider during after hours 7P-7A, please log into the web site [email or web address] and access using universal Newman password for that web site. If you do not have the password, please call the hospital operator.  11/02/2019, 4:32 PM

## 2019-11-02 NOTE — Progress Notes (Signed)
Physical Therapy Treatment Patient Details Name: Jacqueline Mathews MRN: [id number] DOB: September 27, 1934 Today's Date: 11/02/2019    History of Present Illness Pt is an 85 y.o. female with no significant PMH, admitted 10/30/19 after fall at home, found down by daughter 18 hrs later; potential syncopal event. CT of head, C-spine, maxillofacial, chest, and abdomen done - only acute findings were large mesenteric hematoma, large hiatal hernia. Also with rhabdomyolysis. Urology suggests hematoma from possible renal injury from the remaninig tissue vs disruption of the ureteropelvic junction.   PT Comments    Noted orders for "check orthostatics in am with PT." Pt progressing with mobility, requiring consistent minA to stand and maintain balance with short ambulation distance. LE movement improving, but pt remains limited by significant R anterior chest/shoulder and abdominal pain with all movements, as well as hypertension (see values below; RN notified). SpO2 94% on RA, HR 90  Sitting BP 145/130 Standing BP 188/104    Follow Up Recommendations  SNF;Supervision for mobility/OOB     Equipment Recommendations  (defer)    Recommendations for Other Services       Precautions / Restrictions Precautions Precautions: Fall;Other (comment) Precaution Comments: Hypertensive Restrictions Weight Bearing Restrictions: No    Mobility  Bed Mobility               General bed mobility comments: Received sitting in recliner  Transfers Overall transfer level: Needs assistance Equipment used: 1 person hand held assist Transfers: Sit to/from Stand Sit to Stand: Min assist         General transfer comment: MinA for UE support to elevate trunk standing from [facility] and recliner; pt guarding in significant pain  Ambulation/Gait Ambulation/Gait assistance: Min assist Gait Distance (Feet): 4 Feet Assistive device: 1 person hand held assist Gait Pattern/deviations: Step-to pattern;Trunk  flexed;Antalgic Gait velocity: Decreased   General Gait Details: MinA for HHA, slow guarded steps, further mobility limited by pain   Stairs             Wheelchair Mobility    Modified Rankin (Stroke Patients Only)       Balance Overall balance assessment: Needs assistance   Sitting balance-Leahy Scale: Fair       Standing balance-Leahy Scale: Poor Standing balance comment: Reliant on HHA                            Cognition Arousal/Alertness: Awake/alert Behavior During Therapy: WFL for tasks assessed/performed Overall Cognitive Status: Within Functional Limits for tasks assessed                                 General Comments: WFL for simple tasks, although internally distracted by pain; likely exacerbated by [facility]      Exercises General Exercises - Lower Extremity Long Arc Quad: AROM;Both;Seated Hip Flexion/Marching: AROM(able to perform but limited by abdominal pain)    General Comments General comments (skin integrity, edema, etc.): Daughter and brother present and supportive. Sitting BP 145/130, standing BP 188/104 (RN notified); SpO2 94% on RA, HR 90      Pertinent Vitals/Pain Pain Assessment: Faces Faces Pain Scale: Hurts whole lot Pain Location: "All over" - especially R pectoralis/shoulder region, abdomen Pain Descriptors / Indicators: Discomfort;Grimacing;Guarding;Moaning Pain Intervention(s): Monitored during session;Limited activity within patient's tolerance    Home Living  Prior Function            PT Goals (current goals can now be found in the care plan section) Progress towards PT goals: Progressing toward goals    Frequency    Min 2X/week      PT Plan Frequency needs to be updated    Co-evaluation              AM-PAC PT "6 Clicks" Mobility   Outcome Measure  Help needed turning from your back to your side while in a flat bed without using bedrails?: A  Little Help needed moving from lying on your back to sitting on the side of a flat bed without using bedrails?: A Little Help needed moving to and from a bed to a chair (including a wheelchair)?: A Little Help needed standing up from a chair using your arms (e.g., wheelchair or bedside chair)?: A Little Help needed to walk in hospital room?: A Little Help needed climbing 3-5 steps with a railing? : A Lot 6 Click Score: 17    End of Session   Activity Tolerance: Patient limited by pain Patient left: in chair;with call bell/phone within reach;with chair alarm set;with family/visitor present Nurse Communication: Mobility status PT Visit Diagnosis: Other abnormalities of gait and mobility (R26.89);Muscle weakness (generalized) (M62.81);Pain     Time: 8546-2703 PT Time Calculation (min) (ACUTE ONLY): 20 min  Charges:  $Therapeutic Activity: 8-22 mins                    Ina Homes, PT, DPT Acute Rehabilitation Services  Pager [phone] Office [phone]  Malachy Chamber 11/02/2019, 12:55 PM

## 2019-11-02 NOTE — Evaluation (Addendum)
Occupational Therapy Evaluation Patient Details Name: Jacqueline Mathews MRN: [id number] DOB: 12-Sep-1934 Today's Date: 11/02/2019    History of Present Illness Pt is an 85 y.o. female with no significant PMH, admitted 10/30/19 after fall at home, found down by daughter 18 hrs later; potential syncopal event. CT of head, C-spine, maxillofacial, chest, and abdomen done - only acute findings were large mesenteric hematoma, large hiatal hernia. Also with rhabdomyolysis. Urology suggests hematoma from possible renal injury from the remaninig tissue vs disruption of the ureteropelvic junction.   Clinical Impression   PTA patient reports independent.  Admitted for above and limited by problem list below, including pain (abd, R anterior chest/shoulder), generalized weakness, decreased activity tolerance, and impaired balance.  She currently requires min assist for transfers, setup to max assist for ADLs.  Noted pt hypertensive today (sitting BP 145/130 and standing 188/104), RN aware.  She is highly motivated and eager to return to PLOF, great support from family. She will benefit from continued OT services while admitted and after dc at SNF level to optimize independence and safety with ADLs, mobility.      Follow Up Recommendations  SNF;Supervision/Assistance - 24 hour    Equipment Recommendations  3 in 1 bedside commode    Recommendations for Other Services       Precautions / Restrictions Precautions Precautions: Fall;Other (comment) Precaution Comments: Hypertensive Restrictions Weight Bearing Restrictions: No      Mobility Bed Mobility Overal bed mobility: Needs Assistance             General bed mobility comments: sitting on BSC upon entry   Transfers Overall transfer level: Needs assistance Equipment used: 1 person hand held assist Transfers: Sit to/from Stand Sit to Stand: Min assist         General transfer comment: MinA for UE support to elevate trunk standing from [facility]  and recliner; pt guarding in significant pain    Balance Overall balance assessment: Needs assistance Sitting-balance support: No upper extremity supported;Feet supported Sitting balance-Leahy Scale: Fair Sitting balance - Comments: limited due to pain, static only   Standing balance support: Single extremity supported;During functional activity Standing balance-Leahy Scale: Poor Standing balance comment: Reliant on HHA                           ADL either performed or assessed with clinical judgement   ADL Overall ADL's : Needs assistance/impaired     Grooming: Set up;Sitting;Oral care   Upper Body Bathing: Minimal assistance;Sitting   Lower Body Bathing: Maximal assistance;Sit to/from stand   Upper Body Dressing : Set up;Sitting   Lower Body Dressing: Sit to/from stand;Maximal assistance Lower Body Dressing Details (indicate cue type and reason): pt unable to manage socks, min assist sit to stand  Toilet Transfer: Minimal assistance;+2 for safety/equipment;Stand-pivot Toilet Transfer Details (indicate cue type and reason): from [facility] to recliner          Functional mobility during ADLs: Minimal assistance;Cueing for safety General ADL Comments: pt limited by pain, weakness, decreased activity tolerance     Vision   Vision Assessment?: No apparent visual deficits     Perception     Praxis      Pertinent Vitals/Pain Pain Assessment: Faces Faces Pain Scale: Hurts whole lot Pain Location: "All over" - especially R pectoralis/shoulder region, abdomen Pain Descriptors / Indicators: Discomfort;Grimacing;Guarding;Moaning;Sore Pain Intervention(s): Monitored during session;Limited activity within patient's tolerance     Hand Dominance Right   Extremity/Trunk Assessment  Upper Extremity Assessment Upper Extremity Assessment: Generalized weakness(functional ROM but limited by soreness)   Lower Extremity Assessment Lower Extremity Assessment: Defer to PT  evaluation       Communication Communication Communication: HOH   Cognition Arousal/Alertness: Awake/alert Behavior During Therapy: WFL for tasks assessed/performed Overall Cognitive Status: Within Functional Limits for tasks assessed                                 General Comments: WFL for simple tasks, although internally distracted by pain; likely exacerbated by [facility]   General Comments  Daughter and brother present and supportive. Sitting BP 145/130, standing BP 188/104 (RN notified); SpO2 94% on RA, HR 90    Exercises General Exercises - Lower Extremity Long Arc Quad: AROM;Both;Seated Hip Flexion/Marching: AROM(able to perform but limited by abdominal pain)   Shoulder Instructions      Home Living Family/patient expects to be discharged to:: Private residence Living Arrangements: Alone Available Help at Discharge: Family;Available PRN/intermittently Type of Home: House Home Access: Stairs to enter [employer] of Steps: 2-3 Entrance Stairs-Rails: None Home Layout: One level     Bathroom Shower/Tub: Chief Strategy Officer: Standard     Home Equipment: None          Prior Functioning/Environment Level of Independence: Independent        Comments: Independent, drives, active; enjoys gardening and housework        OT Problem List: Decreased strength;Decreased activity tolerance;Impaired balance (sitting and/or standing);Decreased knowledge of use of DME or AE;Decreased knowledge of precautions;Cardiopulmonary status limiting activity;Pain      OT Treatment/Interventions: Self-care/ADL training;DME and/or AE instruction;Therapeutic activities;Patient/family education;Balance training;Therapeutic exercise;Energy conservation    OT Goals(Current goals can be found in the care plan section) Acute Rehab OT Goals Patient Stated Goal: Hopeful to get back home, especially to her gardening OT Goal Formulation: With patient Time  For Goal Achievement: 11/16/19 Potential to Achieve Goals: Good  OT Frequency: Min 2X/week   Barriers to D/C:            Co-evaluation PT/OT/SLP Co-Evaluation/Treatment: Yes Reason for Co-Treatment: For patient/therapist safety;Other (comment)(pain)   OT goals addressed during session: ADL's and self-care      AM-PAC OT "6 Clicks" Daily Activity     Outcome Measure Help from another person eating meals?: A Little Help from another person taking care of personal grooming?: A Little Help from another person toileting, which includes using toliet, bedpan, or urinal?: A Lot Help from another person bathing (including washing, rinsing, drying)?: A Lot Help from another person to put on and taking off regular upper body clothing?: A Little Help from another person to put on and taking off regular lower body clothing?: A Lot 6 Click Score: 15   End of Session Nurse Communication: Mobility status  Activity Tolerance: Patient tolerated treatment well Patient left: in chair;with call bell/phone within reach;with chair alarm set;with family/visitor present  OT Visit Diagnosis: Other abnormalities of gait and mobility (R26.89);Muscle weakness (generalized) (M62.81);Pain Pain - Right/Left: Right Pain - part of body: Shoulder;Arm(abd)                Time: 2458-0998 OT Time Calculation (min): 21 min Charges:  OT General Charges $OT Visit: 1 Visit OT Evaluation $OT Eval Moderate Complexity: 1 Mod  Barry Brunner, OT Acute Rehabilitation Services Pager [phone] Office [phone]   Chancy Milroy 11/02/2019, 1:10 PM

## 2019-11-03 DIAGNOSIS — R55 Syncope and collapse: Secondary | ICD-10-CM | POA: Diagnosis not present

## 2019-11-03 DIAGNOSIS — R1310 Dysphagia, unspecified: Secondary | ICD-10-CM | POA: Diagnosis not present

## 2019-11-03 DIAGNOSIS — M79632 Pain in left forearm: Secondary | ICD-10-CM | POA: Diagnosis not present

## 2019-11-03 DIAGNOSIS — W19XXXA Unspecified fall, initial encounter: Secondary | ICD-10-CM | POA: Diagnosis not present

## 2019-11-03 DIAGNOSIS — S36892D Contusion of other intra-abdominal organs, subsequent encounter: Secondary | ICD-10-CM | POA: Diagnosis not present

## 2019-11-03 DIAGNOSIS — R2689 Other abnormalities of gait and mobility: Secondary | ICD-10-CM | POA: Diagnosis not present

## 2019-11-03 DIAGNOSIS — T148XXA Other injury of unspecified body region, initial encounter: Secondary | ICD-10-CM | POA: Diagnosis not present

## 2019-11-03 DIAGNOSIS — M6282 Rhabdomyolysis: Secondary | ICD-10-CM | POA: Diagnosis not present

## 2019-11-03 DIAGNOSIS — Z9981 Dependence on supplemental oxygen: Secondary | ICD-10-CM | POA: Diagnosis not present

## 2019-11-03 DIAGNOSIS — K661 Hemoperitoneum: Secondary | ICD-10-CM | POA: Diagnosis not present

## 2019-11-03 DIAGNOSIS — D649 Anemia, unspecified: Secondary | ICD-10-CM | POA: Diagnosis not present

## 2019-11-03 DIAGNOSIS — R2681 Unsteadiness on feet: Secondary | ICD-10-CM | POA: Diagnosis not present

## 2019-11-03 DIAGNOSIS — M25511 Pain in right shoulder: Secondary | ICD-10-CM | POA: Diagnosis not present

## 2019-11-03 DIAGNOSIS — R278 Other lack of coordination: Secondary | ICD-10-CM | POA: Diagnosis not present

## 2019-11-03 DIAGNOSIS — R0902 Hypoxemia: Secondary | ICD-10-CM | POA: Diagnosis not present

## 2019-11-03 DIAGNOSIS — R58 Hemorrhage, not elsewhere classified: Secondary | ICD-10-CM | POA: Diagnosis not present

## 2019-11-03 DIAGNOSIS — I10 Essential (primary) hypertension: Secondary | ICD-10-CM | POA: Diagnosis not present

## 2019-11-03 DIAGNOSIS — S36892A Contusion of other intra-abdominal organs, initial encounter: Secondary | ICD-10-CM | POA: Diagnosis not present

## 2019-11-03 DIAGNOSIS — D500 Iron deficiency anemia secondary to blood loss (chronic): Secondary | ICD-10-CM | POA: Diagnosis not present

## 2019-11-03 DIAGNOSIS — M25522 Pain in left elbow: Secondary | ICD-10-CM | POA: Diagnosis not present

## 2019-11-03 DIAGNOSIS — S37011D Minor contusion of right kidney, subsequent encounter: Secondary | ICD-10-CM | POA: Diagnosis not present

## 2019-11-03 DIAGNOSIS — Z7401 Bed confinement status: Secondary | ICD-10-CM | POA: Diagnosis not present

## 2019-11-03 DIAGNOSIS — M6281 Muscle weakness (generalized): Secondary | ICD-10-CM | POA: Diagnosis not present

## 2019-11-03 DIAGNOSIS — R319 Hematuria, unspecified: Secondary | ICD-10-CM | POA: Diagnosis not present

## 2019-11-03 DIAGNOSIS — M79642 Pain in left hand: Secondary | ICD-10-CM | POA: Diagnosis not present

## 2019-11-03 DIAGNOSIS — K219 Gastro-esophageal reflux disease without esophagitis: Secondary | ICD-10-CM | POA: Diagnosis not present

## 2019-11-03 DIAGNOSIS — M2550 Pain in unspecified joint: Secondary | ICD-10-CM | POA: Diagnosis not present

## 2019-11-03 DIAGNOSIS — M79602 Pain in left arm: Secondary | ICD-10-CM | POA: Diagnosis not present

## 2019-11-03 LAB — BPAM RBC
Blood Product Expiration Date: 202105272359
Blood Product Expiration Date: 202105282359
ISSUE DATE / TIME: 202104290932
ISSUE DATE / TIME: 202105060248
Unit Type and Rh: 5100
Unit Type and Rh: 5100

## 2019-11-03 LAB — TYPE AND SCREEN
ABO/RH(D): O POS
Antibody Screen: NEGATIVE
Unit division: 0
Unit division: 0

## 2019-11-03 LAB — CBC
HCT: 31.7 % — ABNORMAL LOW (ref 36.0–46.0)
Hemoglobin: 10.5 g/dL — ABNORMAL LOW (ref 12.0–15.0)
MCH: 30.5 pg (ref 26.0–34.0)
MCHC: 33.1 g/dL (ref 30.0–36.0)
MCV: 92.2 fL (ref 80.0–100.0)
Platelets: 142 10*3/uL — ABNORMAL LOW (ref 150–400)
RBC: 3.44 MIL/uL — ABNORMAL LOW (ref 3.87–5.11)
RDW: 15.8 % — ABNORMAL HIGH (ref 11.5–15.5)
WBC: 7.1 10*3/uL (ref 4.0–10.5)
nRBC: 0 % (ref 0.0–0.2)

## 2019-11-03 MED ORDER — AMLODIPINE BESYLATE 5 MG PO TABS: 5.0000 mg | ORAL_TABLET | Freq: Every day | ORAL | 0 refills | Status: DC

## 2019-11-03 MED ORDER — AMLODIPINE BESYLATE 5 MG PO TABS
5.0000 mg | ORAL_TABLET | Freq: Every day | ORAL | Status: DC
  Filled 2019-11-03: qty 1

## 2019-11-03 MED ORDER — PANTOPRAZOLE SODIUM 40 MG PO TBEC: 40.0000 mg | DELAYED_RELEASE_TABLET | Freq: Every day | ORAL | 0 refills | Status: AC

## 2019-11-03 MED ORDER — DOCUSATE SODIUM 100 MG PO CAPS: 100.0000 mg | ORAL_CAPSULE | Freq: Two times a day (BID) | ORAL | 0 refills | Status: DC

## 2019-11-03 MED ORDER — TRAMADOL HCL 50 MG PO TABS: 50.0000 mg | ORAL_TABLET | Freq: Four times a day (QID) | ORAL | 0 refills | Status: AC | PRN

## 2019-11-03 MED ORDER — PANTOPRAZOLE SODIUM 40 MG PO TBEC
40.0000 mg | DELAYED_RELEASE_TABLET | Freq: Every day | ORAL | Status: DC
  Administered 2019-11-03: 40 mg via ORAL
  Filled 2019-11-03: qty 1

## 2019-11-03 MED ORDER — POLYETHYLENE GLYCOL 3350 17 G PO PACK: 17.0000 g | PACK | Freq: Every day | ORAL | 0 refills | Status: DC | PRN

## 2019-11-03 NOTE — Progress Notes (Signed)
Report given to [employer]

## 2019-11-03 NOTE — Progress Notes (Addendum)
Patient c/o acid reflux, she states that she takes prilosec 20 which is not in her home med list. Dr Blake Divine paged and protonix order received and  given.

## 2019-11-03 NOTE — Plan of Care (Signed)
  Problem: Health Behavior/Discharge Planning: Goal: Ability to manage health-related needs will improve 11/03/2019 1107 by Jennyfer Nickolson, Caren Griffins, RN Outcome: Adequate for Discharge 11/03/2019 3664 by Kelise Kuch, Caren Griffins, RN Outcome: Progressing   Problem: Clinical Measurements: Goal: Ability to maintain clinical measurements within normal limits will improve Outcome: Adequate for Discharge Goal: Will remain free from infection 11/03/2019 1107 by Ameila Weldon, Caren Griffins, RN Outcome: Adequate for Discharge 11/03/2019 4034 by Theta Leaf, Caren Griffins, RN Outcome: Progressing Goal: Diagnostic test results will improve Outcome: Adequate for Discharge Goal: Respiratory complications will improve 11/03/2019 1107 by Albie Bazin, Caren Griffins, RN Outcome: Adequate for Discharge 11/03/2019 7425 by Tej Murdaugh, Caren Griffins, RN Outcome: Progressing Goal: Cardiovascular complication will be avoided Outcome: Adequate for Discharge   Problem: Activity: Goal: Risk for activity intolerance will decrease Outcome: Adequate for Discharge   Problem: Nutrition: Goal: Adequate nutrition will be maintained Outcome: Adequate for Discharge   Problem: Coping: Goal: Level of anxiety will decrease Outcome: Adequate for Discharge   Problem: Elimination: Goal: Will not experience complications related to bowel motility Outcome: Adequate for Discharge Goal: Will not experience complications related to urinary retention Outcome: Adequate for Discharge   Problem: Pain Managment: Goal: General experience of comfort will improve Outcome: Adequate for Discharge   Problem: Safety: Goal: Ability to remain free from injury will improve Outcome: Adequate for Discharge   Problem: Skin Integrity: Goal: Risk for impaired skin integrity will decrease Outcome: Adequate for Discharge    Patient has transfer orders to [facility]

## 2019-11-03 NOTE — TOC Progression Note (Signed)
Transition of Care [facility]) - Progression Note    Patient Details  Name: Jacqueline Mathews MRN: [id number] Date of Birth: 06-07-1935  Transition of Care [facility]) CM/SW Contact  Mearl Latin, LCSW Phone Number: 11/03/2019, 9:16 AM  Clinical Narrative:    CSW confirmed with [facility] that if patient is medically ready today, she will not need a new COVID test. MD aware and reports patient is ready today. CSW spoke with patient's daughter, [name]. She will contact [facility] to arrange paperwork. Patient will require [facility] for transport.    Expected Discharge Plan: Skilled Nursing Facility Barriers to Discharge: Continued Medical Work up  Expected Discharge Plan and Services Expected Discharge Plan: Skilled Nursing Facility In-house Referral: Clinical Social Work Discharge Planning Services: CM Consult Post Acute Care Choice: Durable Medical Equipment Living arrangements for the past 2 months: Single Family Home                                       Social Determinants of Health (SDOH) Interventions    Readmission Risk Interventions Readmission Risk Prevention Plan 10/31/2019  Post Dischage Appt Not Complete  Appt Comments pt PCP retired  Medication Screening Complete  Transportation Screening Complete  Some recent data might be hidden

## 2019-11-03 NOTE — TOC Transition Note (Signed)
Transition of Care [facility]) - CM/SW Discharge Note   Patient Details  Name: Jacqueline Mathews MRN: [id number] Date of Birth: [birth year]  Transition of Care [facility]) CM/SW Contact:  Gildardo Griffes, LCSW Phone Number: 11/03/2019, 1:30 PM   Clinical Narrative:     Patient will DC to: [facility] Anticipated DC date: 11/03/19 Family notified:Cher[facility] MW:UXLK  Per MD patient ready for DC to [facility] . RN, patient, patient's family, and facility notified of DC. Discharge Summary sent to facility. RN given number for report [phone] . DC packet on chart. Ambulance transport requested for patient.  CSW signing off.  [place], [place] [phone]   Final next level of care: Skilled Nursing Facility Barriers to Discharge: No Barriers Identified   Patient Goals and CMS Choice Patient states their goals for this hospitalization and ongoing recovery are:: feel better, be able to get back home independently CMS Medicare.gov Compare Post Acute Care list provided to:: Patient Represenative (must comment)(daughter [name]) Choice offered to / list presented to : Adult Children  Discharge Placement PASRR number recieved: 11/01/19            Patient chooses bed at: [facility] Patient to be transferred to facility by: [facility] Name of family member notified: [name] Patient and family notified of of transfer: 11/03/19  Discharge Plan and Services In-house Referral: Clinical Social Work Discharge Planning Services: CM Consult Post Acute Care Choice: Durable Medical Equipment                               Social Determinants of Health (SDOH) Interventions     Readmission Risk Interventions Readmission Risk Prevention Plan 10/31/2019  Post Dischage Appt Not Complete  Appt Comments pt PCP retired  Medication Screening Complete  Transportation Screening Complete  Some recent data might be hidden

## 2019-11-03 NOTE — Discharge Summary (Addendum)
Physician Discharge Summary  Jacqueline Mathews:[id number] DOB: 03-30-1935 DOA: 10/30/2019  PCP: Gordy Savers, MD  Admit date: 10/30/2019 Discharge date: 11/03/2019  Admitted From: Home Disposition: SNF  Recommendations for Outpatient Follow-up:  1. Follow up with PCP in 1-2 weeks 2. Please obtain BMP/CBC in one week Please follow up with Urology as needed.    Discharge Condition:stable.  CODE STATUS:full code.  Diet recommendation: Heart Healthy   Brief/Interim Summary: 85 year old lady with no sig history presents with a mechanical fall, syncope and mild rhabdomyolysis. . Ct head and cervical spine is unremarkable.  CT abd and pelvis shows large mesenteric hematoma for which trauma surgeon consulted and recommendations.  urology consulted , suggested non functional left kidney,  Probably hematoma from possible renal injury from the remaninig tissue vs disruption of the ureteropelvic junction. Recommended to treat this as a perinephric hematoma/ renal injury with bedrest and serial hemoglobin. Trauma surgery consulted and no further recommendations from surgery standpoint.    Discharge Diagnoses:  Principal Problem:   Mesenteric hematoma Active Problems:   ANEMIA, SECONDARY TO CHRONIC BLOOD LOSS   Syncope and collapse   Intraabdominal hemorrhage  Syncope: Orthostatic vital signs are negative.  Echocardiogram unremarkable, showed hyperdynamic EF and grade 1 diastolic dysfunction.  No arrhythmias on telemetry.  Pt denies any nausea or dizziness.    Mesenteric hematoma/ perinephric hematoma:  posisbly from the mechanical fall.  Transfused 1 unit of prbc transfusion, repeat H&H IS greater than 9.  Repeat hemoglobin is round 10.     Anemia of blood loss from mechanical fall Transfuse to keep hemoglobin greater than 7.    Lactic acidosis from hypotension Resolved.    AKI:  Resolved.    Mild rhabdomyolysis:  Much improved from IV fluids. Advanced  diet to regular diet.   Elevated BP parameters. No h/o hypertension.  Probably secondary to pain, but will start her on low dose amlodipine. Recommend outpatient follow up.    Discharge Instructions  Discharge Instructions    Diet - low sodium heart healthy   Complete by: As directed    Discharge instructions   Complete by: As directed    Please follow up with PCP in one week.     Allergies as of 11/03/2019      Reactions   Clarithromycin Hives   Penicillin G Benzathine Hives   Sulfamethoxazole-trimethoprim Hives      Medication List    TAKE these medications   amLODipine 5 MG tablet Commonly known as: NORVASC Take 1 tablet (5 mg total) by mouth daily.   bimatoprost 0.01 % Soln Commonly known as: LUMIGAN Place 1 drop into both eyes at bedtime.   docusate sodium 100 MG capsule Commonly known as: COLACE Take 1 capsule (100 mg total) by mouth 2 (two) times daily.   pantoprazole 40 MG tablet Commonly known as: PROTONIX Take 1 tablet (40 mg total) by mouth daily at 6 (six) AM.   polyethylene glycol 17 g packet Commonly known as: MIRALAX / GLYCOLAX Take 17 g by mouth daily as needed for mild constipation.   timolol 0.25 % ophthalmic solution Commonly known as: TIMOPTIC Place 1 drop into both eyes in the morning and at bedtime.   traMADol 50 MG tablet Commonly known as: ULTRAM Take 1 tablet (50 mg total) by mouth every 6 (six) hours as needed for up to 3 days for moderate pain.       Contact information for follow-up providers    Eleonore Chiquito  F, MD. Schedule an appointment as soon as possible for a visit in 1 week(s).   Specialty: Internal Medicine           Contact information for after-discharge care    Destination    [facility] .   Service: Skilled Nursing Contact information: [address] [place] [address] [phone]                 Allergies  Allergen Reactions  .  Clarithromycin Hives  . Penicillin G Benzathine Hives  . Sulfamethoxazole-Trimethoprim Hives    Consultations:  Trauma  Urology.    Procedures/Studies: DG Pelvis 1-2 Views  Result Date: 10/30/2019 CLINICAL DATA:  Syncope EXAM: PELVIS - 1-2 VIEW COMPARISON:  None. FINDINGS: Supine frontal view of the pelvis excludes portions of the proximal left femur. No acute displaced fracture. Symmetrical bilateral hip osteoarthritis. Left convex scoliosis of the lumbar spine. Sacroiliac joints are normal. IMPRESSION: 1. No acute displaced fracture. Electronically Signed   By: Sharlet Salina M.D.   On: 10/30/2019 20:51   CT Head Wo Contrast  Result Date: 10/30/2019 CLINICAL DATA:  85 year old female with trauma. EXAM: CT HEAD WITHOUT CONTRAST CT MAXILLOFACIAL WITHOUT CONTRAST CT CERVICAL SPINE WITHOUT CONTRAST TECHNIQUE: Multidetector CT imaging of the head, cervical spine, and maxillofacial structures were performed using the standard protocol without intravenous contrast. Multiplanar CT image reconstructions of the cervical spine and maxillofacial structures were also generated. COMPARISON:  None. FINDINGS: CT HEAD FINDINGS Brain: Moderate age-related atrophy and chronic microvascular ischemic changes. There is no acute intracranial hemorrhage. No mass effect or midline shift. No extra-axial fluid collection. A 5 mm rounded calcified structure to the right of the anterior falx may represent a small calcified meningioma. Vascular: No hyperdense vessel or unexpected calcification. Skull: Normal. Negative for fracture or focal lesion. Other: None CT MAXILLOFACIAL FINDINGS Osseous: No acute fracture. No mandibular subluxation Orbits: The globes and retro-orbital fat are preserved. Sinuses: Clear. Soft tissues: Negative. CT CERVICAL SPINE FINDINGS Alignment: No acute subluxation. Skull base and vertebrae: No acute fracture. Osteopenia. Soft tissues and spinal canal: No prevertebral fluid or swelling. No visible  canal hematoma. Disc levels:  No acute findings. Multilevel degenerative changes. Upper chest: Emphysema. Other: Bilateral carotid bulb calcified plaques. IMPRESSION: 1. No acute intracranial hemorrhage. Age-related atrophy and chronic microvascular ischemic changes. 2. No acute/traumatic cervical spine pathology. 3. No acute facial bone fractures. Electronically Signed   By: Elgie Collard M.D.   On: 10/30/2019 21:38   CT Chest W Contrast  Result Date: 10/30/2019 CLINICAL DATA:  [place] yesterday, found down, hematemesis, syncope EXAM: CT CHEST, ABDOMEN, AND PELVIS WITH CONTRAST TECHNIQUE: Multidetector CT imaging of the chest, abdomen and pelvis was performed following the standard protocol during bolus administration of intravenous contrast. CONTRAST:  OMNIPAQUE IOHEXOL 300 MG/ML  SOLN COMPARISON:  None. FINDINGS: CT CHEST FINDINGS Cardiovascular: Heart and great vessels are unremarkable without pericardial effusion. Significant atherosclerosis of the aorta and coronary vessels. No evidence of thoracic aortic aneurysm or dissection. Mediastinum/Nodes: There is a large hiatal hernia. 1 cm hypodensity left lobe thyroid does not warrant further follow-up based on size and patient age. Trachea and esophagus are unremarkable. No pathologic adenopathy. Lungs/Pleura: No airspace disease, effusion, or pneumothorax. Central airways are patent. Musculoskeletal: No acute displaced fractures. Rotatory scoliosis of the thoracolumbar spine. Reconstructed images demonstrate no additional findings. CT ABDOMEN PELVIS FINDINGS Hepatobiliary: There are innumerable hepatic cysts replacing the majority of the left lobe of the liver. Gallbladder  is moderately distended small calcified gallstones. No evidence of cholecystitis. Pancreas: Unremarkable. No pancreatic ductal dilatation or surrounding inflammatory changes. Spleen: No splenic injury or perisplenic hematoma. Adrenals/Urinary Tract: The left kidney has been removed in  the interim. Large extrarenal pelvis right kidney again seen. No evidence of hydronephrosis. Nonobstructing 7 mm calculus lower pole right kidney. The bladder is grossly unremarkable. The adrenals are not identified. Stomach/Bowel: There is a large hiatal hernia with the majority of the stomach in an intrathoracic position. There is no bowel obstruction or ileus. Diverticulosis of the sigmoid colon without diverticulitis. Vascular/Lymphatic: There is extensive atherosclerosis of the aorta and its distal branches. No pathologic adenopathy. Reproductive: Uterus is atrophic.  No adnexal masses. Other: Heterogeneous free fluid is seen throughout the abdomen and pelvis. Fluid in the right flank is fairly simple in appearance compatible with bland ascites. Heterogeneous fluid of mixed attenuation within the central abdomen and pelvis extends approximately 16.7 x 10.8 cm in transverse dimension, consistent with intraperitoneal hemorrhage and likely clot. An underlying mesenteric mass would be difficult to exclude delayed imaging through this region does not demonstrate any pooling of contrast to suggest ongoing hemorrhage. There is no free intra-abdominal gas.  No abdominal wall hernia. Musculoskeletal: No acute displaced fractures. Reconstructed images demonstrate no additional findings. IMPRESSION: 1. Heterogeneous process in the central abdomen and pelvis, likely a large mesenteric hematoma. No contrast extravasation to suggest active hemorrhage. Underlying mass in this region cannot be excluded, and appropriate follow-up will be required. 2. No acute intrathoracic trauma. 3. Nonobstructing right renal calculus. The left kidney is not identified and likely surgically absent. 4. Innumerable hepatic cysts. 5. Large hiatal hernia. 6. 1 cm left lobe thyroid nodule. Not clinically significant; no follow-up imaging recommended. (Ref: J Am Coll Radiol. 2015 Feb;12(2): 143-50). 7.  Aortic Atherosclerosis (ICD10-I70.0). These  results were called by telephone at the time of interpretation on 10/30/2019 at 9:48 pm to provider [facility] , who verbally acknowledged these results. Electronically Signed   By: Sharlet Salina M.D.   On: 10/30/2019 21:48   CT Cervical Spine Wo Contrast  Result Date: 10/30/2019 CLINICAL DATA:  85 year old female with trauma. EXAM: CT HEAD WITHOUT CONTRAST CT MAXILLOFACIAL WITHOUT CONTRAST CT CERVICAL SPINE WITHOUT CONTRAST TECHNIQUE: Multidetector CT imaging of the head, cervical spine, and maxillofacial structures were performed using the standard protocol without intravenous contrast. Multiplanar CT image reconstructions of the cervical spine and maxillofacial structures were also generated. COMPARISON:  None. FINDINGS: CT HEAD FINDINGS Brain: Moderate age-related atrophy and chronic microvascular ischemic changes. There is no acute intracranial hemorrhage. No mass effect or midline shift. No extra-axial fluid collection. A 5 mm rounded calcified structure to the right of the anterior falx may represent a small calcified meningioma. Vascular: No hyperdense vessel or unexpected calcification. Skull: Normal. Negative for fracture or focal lesion. Other: None CT MAXILLOFACIAL FINDINGS Osseous: No acute fracture. No mandibular subluxation Orbits: The globes and retro-orbital fat are preserved. Sinuses: Clear. Soft tissues: Negative. CT CERVICAL SPINE FINDINGS Alignment: No acute subluxation. Skull base and vertebrae: No acute fracture. Osteopenia. Soft tissues and spinal canal: No prevertebral fluid or swelling. No visible canal hematoma. Disc levels:  No acute findings. Multilevel degenerative changes. Upper chest: Emphysema. Other: Bilateral carotid bulb calcified plaques. IMPRESSION: 1. No acute intracranial hemorrhage. Age-related atrophy and chronic microvascular ischemic changes. 2. No acute/traumatic cervical spine pathology. 3. No acute facial bone fractures. Electronically Signed   By: Elgie Collard  M.D.   On: 10/30/2019 21:38  CT ABDOMEN PELVIS W CONTRAST  Result Date: 10/30/2019 CLINICAL DATA:  [place] yesterday, found down, hematemesis, syncope EXAM: CT CHEST, ABDOMEN, AND PELVIS WITH CONTRAST TECHNIQUE: Multidetector CT imaging of the chest, abdomen and pelvis was performed following the standard protocol during bolus administration of intravenous contrast. CONTRAST:  139mL OMNIPAQUE IOHEXOL 300 MG/ML  SOLN COMPARISON:  None. FINDINGS: CT CHEST FINDINGS Cardiovascular: Heart and great vessels are unremarkable without pericardial effusion. Significant atherosclerosis of the aorta and coronary vessels. No evidence of thoracic aortic aneurysm or dissection. Mediastinum/Nodes: There is a large hiatal hernia. 1 cm hypodensity left lobe thyroid does not warrant further follow-up based on size and patient age. Trachea and esophagus are unremarkable. No pathologic adenopathy. Lungs/Pleura: No airspace disease, effusion, or pneumothorax. Central airways are patent. Musculoskeletal: No acute displaced fractures. Rotatory scoliosis of the thoracolumbar spine. Reconstructed images demonstrate no additional findings. CT ABDOMEN PELVIS FINDINGS Hepatobiliary: There are innumerable hepatic cysts replacing the majority of the left lobe of the liver. Gallbladder is moderately distended small calcified gallstones. No evidence of cholecystitis. Pancreas: Unremarkable. No pancreatic ductal dilatation or surrounding inflammatory changes. Spleen: No splenic injury or perisplenic hematoma. Adrenals/Urinary Tract: The left kidney has been removed in the interim. Large extrarenal pelvis right kidney again seen. No evidence of hydronephrosis. Nonobstructing 7 mm calculus lower pole right kidney. The bladder is grossly unremarkable. The adrenals are not identified. Stomach/Bowel: There is a large hiatal hernia with the majority of the stomach in an intrathoracic position. There is no bowel obstruction or ileus. Diverticulosis of the  sigmoid colon without diverticulitis. Vascular/Lymphatic: There is extensive atherosclerosis of the aorta and its distal branches. No pathologic adenopathy. Reproductive: Uterus is atrophic.  No adnexal masses. Other: Heterogeneous free fluid is seen throughout the abdomen and pelvis. Fluid in the right flank is fairly simple in appearance compatible with bland ascites. Heterogeneous fluid of mixed attenuation within the central abdomen and pelvis extends approximately 16.7 x 10.8 cm in transverse dimension, consistent with intraperitoneal hemorrhage and likely clot. An underlying mesenteric mass would be difficult to exclude delayed imaging through this region does not demonstrate any pooling of contrast to suggest ongoing hemorrhage. There is no free intra-abdominal gas.  No abdominal wall hernia. Musculoskeletal: No acute displaced fractures. Reconstructed images demonstrate no additional findings. IMPRESSION: 1. Heterogeneous process in the central abdomen and pelvis, likely a large mesenteric hematoma. No contrast extravasation to suggest active hemorrhage. Underlying mass in this region cannot be excluded, and appropriate follow-up will be required. 2. No acute intrathoracic trauma. 3. Nonobstructing right renal calculus. The left kidney is not identified and likely surgically absent. 4. Innumerable hepatic cysts. 5. Large hiatal hernia. 6. 1 cm left lobe thyroid nodule. Not clinically significant; no follow-up imaging recommended. (Ref: J Am Coll Radiol. 2015 Feb;12(2): 143-50). 7.  Aortic Atherosclerosis (ICD10-I70.0). These results were called by telephone at the time of interpretation on 10/30/2019 at 9:48 pm to provider [facility] , who verbally acknowledged these results. Electronically Signed   By: Randa Ngo M.D.   On: 10/30/2019 21:48   DG Chest Port 1 View  Result Date: 10/30/2019 CLINICAL DATA:  Syncope EXAM: PORTABLE CHEST 1 VIEW COMPARISON:  04/09/2005 FINDINGS: Single frontal view of the  chest demonstrates a stable cardiac silhouette. Atherosclerosis of the thoracic aorta. No airspace disease, effusion, or pneumothorax. Stable rotatory scoliosis of the thoracolumbar spine. IMPRESSION: No acute intrathoracic process. Electronically Signed   By: Randa Ngo M.D.   On: 10/30/2019 20:50   ECHOCARDIOGRAM COMPLETE  Result Date: 10/31/2019    ECHOCARDIOGRAM REPORT   Patient Name:   Jacqueline Mathews Date of Exam: 10/31/2019 Medical Rec #:  [id number]        Height:       64.0 in Accession #:    [id number]       Weight:       100.3 lb Date of Birth:  1935-06-02       BSA:          1.459 m Patient Age:    84 years         BP:           128/64 mmHg Patient Gender: F                HR:           105 bpm. Exam Location:  Inpatient Procedure: 2D Echo Indications:    Syncope R55  History:        Patient has no prior history of Echocardiogram examinations.  Sonographer:    Thurman Coyer RDCS (AE) Referring Phys: [id number] Meryle Ready [facility] IMPRESSIONS  1. Left ventricular ejection fraction, by estimation, is 70 to 75%. The left ventricle has hyperdynamic function. The left ventricle has no regional wall motion abnormalities. Left ventricular diastolic parameters are consistent with Grade I diastolic dysfunction (impaired relaxation).  2. Right ventricular systolic function is normal. The right ventricular size is normal. There is mildly elevated pulmonary artery systolic pressure.  3. The mitral valve is normal in structure. No evidence of mitral valve regurgitation. No evidence of mitral stenosis.  4. Tricuspid valve regurgitation is mild to moderate.  5. The aortic valve is normal in structure. Aortic valve regurgitation is not visualized. No aortic stenosis is present.  6. The inferior vena cava is normal in size with greater than 50% respiratory variability, suggesting right atrial pressure of 3 mmHg. FINDINGS  Left Ventricle: Left ventricular ejection fraction, by estimation, is 70 to 75%. The left  ventricle has hyperdynamic function. The left ventricle has no regional wall motion abnormalities. The left ventricular internal cavity size was normal in size. There is no left ventricular hypertrophy. Left ventricular diastolic parameters are consistent with Grade I diastolic dysfunction (impaired relaxation). Right Ventricle: The right ventricular size is normal. No increase in right ventricular wall thickness. Right ventricular systolic function is normal. There is mildly elevated pulmonary artery systolic pressure. The tricuspid regurgitant velocity is 3.14  m/s, and with an assumed right atrial pressure of 3 mmHg, the estimated right ventricular systolic pressure is 42.4 mmHg. Left Atrium: Left atrial size was normal in size. Right Atrium: Right atrial size was normal in size. Pericardium: There is no evidence of pericardial effusion. Mitral Valve: The mitral valve is normal in structure. Normal mobility of the mitral valve leaflets. No evidence of mitral valve regurgitation. No evidence of mitral valve stenosis. Tricuspid Valve: The tricuspid valve is normal in structure. Tricuspid valve regurgitation is mild to moderate. No evidence of tricuspid stenosis. Aortic Valve: The aortic valve is normal in structure. Aortic valve regurgitation is not visualized. No aortic stenosis is present. Pulmonic Valve: The pulmonic valve was normal in structure. Pulmonic valve regurgitation is not visualized. No evidence of pulmonic stenosis. Aorta: The aortic root is normal in size and structure. Venous: The inferior vena cava is normal in size with greater than 50% respiratory variability, suggesting right atrial pressure of 3 mmHg. IAS/Shunts: No atrial level shunt detected by color flow Doppler.  LEFT VENTRICLE  PLAX 2D LVIDd:         3.10 cm  Diastology LVIDs:         2.00 cm  LV e' lateral:   7.83 cm/s LV PW:         0.80 cm  LV E/e' lateral: 12.9 LV IVS:        0.80 cm  LV e' medial:    8.49 cm/s LVOT diam:     1.80 cm   LV E/e' medial:  11.9 LV SV:         66 LV SV Index:   45 LVOT Area:     2.54 cm  RIGHT VENTRICLE RV S prime:     16.00 cm/s TAPSE (M-mode): 1.3 cm LEFT ATRIUM           Index       RIGHT ATRIUM          Index LA diam:      2.40 cm 1.64 cm/m  RA Area:     9.65 cm LA Vol (A2C): 22.1 ml 15.15 ml/m RA Volume:   17.40 ml 11.93 ml/m LA Vol (A4C): 17.2 ml 11.79 ml/m  AORTIC VALVE LVOT Vmax:   139.00 cm/s LVOT Vmean:  87.400 cm/s LVOT VTI:    0.258 m  AORTA Ao Root diam: 2.30 cm MITRAL VALVE                TRICUSPID VALVE MV Area (PHT): 3.06 cm     TR Peak grad:   39.4 mmHg MV Decel Time: 248 msec     TR Vmax:        314.00 cm/s MV E velocity: 101.00 cm/s MV A velocity: 142.00 cm/s  SHUNTS MV E/A ratio:  0.71         Systemic VTI:  0.26 m                             Systemic Diam: 1.80 cm Donato Schultz MD Electronically signed by Donato Schultz MD Signature Date/Time: 10/31/2019/10:55:00 AM    Final    CT Maxillofacial Wo Contrast  Result Date: 10/30/2019 CLINICAL DATA:  85 year old female with trauma. EXAM: CT HEAD WITHOUT CONTRAST CT MAXILLOFACIAL WITHOUT CONTRAST CT CERVICAL SPINE WITHOUT CONTRAST TECHNIQUE: Multidetector CT imaging of the head, cervical spine, and maxillofacial structures were performed using the standard protocol without intravenous contrast. Multiplanar CT image reconstructions of the cervical spine and maxillofacial structures were also generated. COMPARISON:  None. FINDINGS: CT HEAD FINDINGS Brain: Moderate age-related atrophy and chronic microvascular ischemic changes. There is no acute intracranial hemorrhage. No mass effect or midline shift. No extra-axial fluid collection. A 5 mm rounded calcified structure to the right of the anterior falx may represent a small calcified meningioma. Vascular: No hyperdense vessel or unexpected calcification. Skull: Normal. Negative for fracture or focal lesion. Other: None CT MAXILLOFACIAL FINDINGS Osseous: No acute fracture. No mandibular subluxation  Orbits: The globes and retro-orbital fat are preserved. Sinuses: Clear. Soft tissues: Negative. CT CERVICAL SPINE FINDINGS Alignment: No acute subluxation. Skull base and vertebrae: No acute fracture. Osteopenia. Soft tissues and spinal canal: No prevertebral fluid or swelling. No visible canal hematoma. Disc levels:  No acute findings. Multilevel degenerative changes. Upper chest: Emphysema. Other: Bilateral carotid bulb calcified plaques. IMPRESSION: 1. No acute intracranial hemorrhage. Age-related atrophy and chronic microvascular ischemic changes. 2. No acute/traumatic cervical spine pathology. 3. No acute facial bone fractures. Electronically Signed   By: Burtis Junes  Radparvar M.D.   On: 10/30/2019 21:38      Subjective: No new complaints.   Discharge Exam: Vitals:   11/02/19 2146 11/03/19 0515  BP: (!) 175/109 (!) 156/94  Pulse: 89 87  Resp: 14 15  Temp: 97.7 F (36.5 C) 98.1 F (36.7 C)  SpO2: 91% 93%   Vitals:   11/02/19 0452 11/02/19 1628 11/02/19 2146 11/03/19 0515  BP: (!) 157/76 (!) 174/89 (!) 175/109 (!) 156/94  Pulse: 99 81 89 87  Resp: 17 20 14 15   Temp: 98.6 F (37 C) 98 F (36.7 C) 97.7 F (36.5 C) 98.1 F (36.7 C)  TempSrc: Oral Oral Oral Oral  SpO2: 92% 95% 91% 93%  Weight:      Height:        General: Pt is alert, awake, not in acute distress Cardiovascular: RRR, S1/S2 +, no rubs, no gallops Respiratory: CTA bilaterally, no wheezing, no rhonchi Abdominal: Soft, NT, ND, bowel sounds + Extremities: no edema, no cyanosis    The results of significant diagnostics from this hospitalization (including imaging, microbiology, ancillary and laboratory) are listed below for reference.     Microbiology: Recent Results (from the past 240 hour(s))  Respiratory Panel by RT PCR (Flu A&B, Covid) - Nasopharyngeal Swab     Status: None   Collection Time: 10/30/19 10:14 PM   Specimen: Nasopharyngeal Swab  Result Value Ref Range Status   SARS Coronavirus 2 by RT PCR  NEGATIVE NEGATIVE Final    Comment: (NOTE) SARS-CoV-2 target nucleic acids are NOT DETECTED. The SARS-CoV-2 RNA is generally detectable in upper respiratoy specimens during the acute phase of infection. The lowest concentration of SARS-CoV-2 viral copies this assay can detect is 131 copies/mL. A negative result does not preclude SARS-Cov-2 infection and should not be used as the sole basis for treatment or other patient management decisions. A negative result may occur with  improper specimen collection/handling, submission of specimen other than nasopharyngeal swab, presence of viral mutation(s) within the areas targeted by this assay, and inadequate number of viral copies (<131 copies/mL). A negative result must be combined with clinical observations, patient history, and epidemiological information. The expected result is Negative. Fact Sheet for Patients:  12/30/19 Fact Sheet for Healthcare Providers:  https://www.moore.com/ This test is not yet ap proved or cleared by the https://www.young.biz/ FDA and  has been authorized for detection and/or diagnosis of SARS-CoV-2 by FDA under an Emergency Use Authorization (EUA). This EUA will remain  in effect (meaning this test can be used) for the duration of the COVID-19 declaration under Section 564(b)(1) of the Act, 21 U.S.C. section 360bbb-3(b)(1), unless the authorization is terminated or revoked sooner.    Influenza A by PCR NEGATIVE NEGATIVE Final   Influenza B by PCR NEGATIVE NEGATIVE Final    Comment: (NOTE) The Xpert Xpress SARS-CoV-2/FLU/RSV assay is intended as an aid in  the diagnosis of influenza from Nasopharyngeal swab specimens and  should not be used as a sole basis for treatment. Nasal washings and  aspirates are unacceptable for Xpert Xpress SARS-CoV-2/FLU/RSV  testing. Fact Sheet for Patients: [place] Fact Sheet for Healthcare  Providers: https://www.moore.com/ This test is not yet approved or cleared by the https://www.young.biz/ FDA and  has been authorized for detection and/or diagnosis of SARS-CoV-2 by  FDA under an Emergency Use Authorization (EUA). This EUA will remain  in effect (meaning this test can be used) for the duration of the  Covid-19 declaration under Section 564(b)(1) of the Act, 21  U.S.C. section 360bbb-3(b)(1), unless the authorization is  terminated or revoked. Performed at [facility], [address], [place], [place] [address]      Labs: BNP (last 3 results) No results for input(s): BNP in the last 8760 hours. Basic Metabolic Panel: Recent Labs  Lab 10/30/19 1919 10/30/19 1942 10/31/19 0335 11/01/19 0909 11/02/19 0320  NA 142 142 140 140 140  K 4.1 3.9 4.2 3.8 3.6  CL 109 110 109 113* 112*  CO2 17*  --  21* 24 22  GLUCOSE 137* 132* 112* 113* 106*  BUN 30* 28* 26* 14 9  CREATININE 1.34* 1.00 0.99 0.62 0.68  CALCIUM 9.0  --  8.7* 8.4* 8.3*   Liver Function Tests: Recent Labs  Lab 10/30/19 1919 11/02/19 0320  AST 47* 30  ALT 21 26  ALKPHOS 47 41  BILITOT 1.3* 1.1  PROT 5.6* 5.1*  ALBUMIN 3.0* 2.3*   Recent Labs  Lab 10/30/19 1919  LIPASE 18   No results for input(s): AMMONIA in the last 168 hours. CBC: Recent Labs  Lab 10/30/19 1919 10/30/19 1942 10/31/19 1844 10/31/19 1844 11/01/19 0122 11/01/19 0909 11/01/19 1542 11/02/19 0320 11/03/19 0955  WBC 18.2*   < > 13.7*  --  8.4  --  8.3 9.5 7.1  NEUTROABS 16.0*  --   --   --   --   --   --   --   --   HGB 9.8*   < > 7.9*   < > 6.9* 9.5* 10.0* 10.1* 10.5*  HCT 29.2*   < > 24.3*   < > 20.9* 28.9* 29.9* 29.5* 31.7*  MCV 95.7   < > 97.6  --  96.8  --  92.3 91.6 92.2  PLT 233   < > 178  --  122*  --  151 135* 142*   < > = values in this interval not displayed.   Cardiac Enzymes: Recent Labs  Lab 10/31/19 0335 11/01/19 1420  CKTOTAL 1,209* 257*   BNP: Invalid input(s):  POCBNP CBG: No results for input(s): GLUCAP in the last 168 hours. D-Dimer No results for input(s): DDIMER in the last 72 hours. Hgb A1c No results for input(s): HGBA1C in the last 72 hours. Lipid Profile No results for input(s): CHOL, HDL, LDLCALC, TRIG, CHOLHDL, LDLDIRECT in the last 72 hours. Thyroid function studies No results for input(s): TSH, T4TOTAL, T3FREE, THYROIDAB in the last 72 hours.  Invalid input(s): FREET3 Anemia work up No results for input(s): VITAMINB12, FOLATE, FERRITIN, TIBC, IRON, RETICCTPCT in the last 72 hours. Urinalysis    Component Value Date/Time   COLORURINE YELLOW 10/31/2019 1522   APPEARANCEUR HAZY (A) 10/31/2019 1522   LABSPEC >1.046 (H) 10/31/2019 1522   PHURINE 5.0 10/31/2019 1522   GLUCOSEU NEGATIVE 10/31/2019 1522   HGBUR LARGE (A) 10/31/2019 1522   BILIRUBINUR NEGATIVE 10/31/2019 1522   KETONESUR NEGATIVE 10/31/2019 1522   PROTEINUR NEGATIVE 10/31/2019 1522   NITRITE NEGATIVE 10/31/2019 1522   LEUKOCYTESUR LARGE (A) 10/31/2019 1522   Sepsis Labs Invalid input(s): PROCALCITONIN,  WBC,  LACTICIDVEN Microbiology Recent Results (from the past 240 hour(s))  Respiratory Panel by RT PCR (Flu A&B, Covid) - Nasopharyngeal Swab     Status: None   Collection Time: 10/30/19 10:14 PM   Specimen: Nasopharyngeal Swab  Result Value Ref Range Status   SARS Coronavirus 2 by RT PCR NEGATIVE NEGATIVE Final    Comment: (NOTE) SARS-CoV-2 target nucleic acids are NOT DETECTED. The SARS-CoV-2 RNA  is generally detectable in upper respiratoy specimens during the acute phase of infection. The lowest concentration of SARS-CoV-2 viral copies this assay can detect is 131 copies/mL. A negative result does not preclude SARS-Cov-2 infection and should not be used as the sole basis for treatment or other patient management decisions. A negative result may occur with  improper specimen collection/handling, submission of specimen other than nasopharyngeal swab,  presence of viral mutation(s) within the areas targeted by this assay, and inadequate number of viral copies (<131 copies/mL). A negative result must be combined with clinical observations, patient history, and epidemiological information. The expected result is Negative. Fact Sheet for Patients:  https://www.moore.com/https://www.fda.gov/media/142436/download Fact Sheet for Healthcare Providers:  https://www.young.biz/https://www.fda.gov/media/142435/download This test is not yet ap proved or cleared by the [place] FDA and  has been authorized for detection and/or diagnosis of SARS-CoV-2 by FDA under an Emergency Use Authorization (EUA). This EUA will remain  in effect (meaning this test can be used) for the duration of the COVID-19 declaration under Section 564(b)(1) of the Act, 21 U.S.C. section 360bbb-3(b)(1), unless the authorization is terminated or revoked sooner.    Influenza A by PCR NEGATIVE NEGATIVE Final   Influenza B by PCR NEGATIVE NEGATIVE Final    Comment: (NOTE) The Xpert Xpress SARS-CoV-2/FLU/RSV assay is intended as an aid in  the diagnosis of influenza from Nasopharyngeal swab specimens and  should not be used as a sole basis for treatment. Nasal washings and  aspirates are unacceptable for Xpert Xpress SARS-CoV-2/FLU/RSV  testing. Fact Sheet for Patients: https://www.moore.com/https://www.fda.gov/media/142436/download Fact Sheet for Healthcare Providers: https://www.young.biz/https://www.fda.gov/media/142435/download This test is not yet approved or cleared by the [place] FDA and  has been authorized for detection and/or diagnosis of SARS-CoV-2 by  FDA under an Emergency Use Authorization (EUA). This EUA will remain  in effect (meaning this test can be used) for the duration of the  Covid-19 declaration under Section 564(b)(1) of the Act, 21  U.S.C. section 360bbb-3(b)(1), unless the authorization is  terminated or revoked. Performed at [facility], [address] [place], [place] [id number]      Time coordinating  discharge:32  Minutes.  SIGNED:   Kathlen ModyVijaya Khandi Kernes, MD  [facility] 11/03/2019, 10:35 AM

## 2019-11-03 NOTE — Progress Notes (Signed)
CSW confirmed with [facility] that if patient is medically ready today, she will not need a new COVID test. MD aware and reports patient is ready today. CSW contacting patient's daughter to complete admission paperwork.  Carra Brindley LCSW

## 2019-11-03 NOTE — Plan of Care (Signed)
?  Problem: Health Behavior/Discharge Planning: ?Goal: Ability to manage health-related needs will improve ?Outcome: Progressing ?  ?Problem: Clinical Measurements: ?Goal: Will remain free from infection ?Outcome: Progressing ?Goal: Respiratory complications will improve ?Outcome: Progressing ?  ?

## 2019-11-05 DIAGNOSIS — D649 Anemia, unspecified: Secondary | ICD-10-CM | POA: Diagnosis not present

## 2019-11-05 DIAGNOSIS — R55 Syncope and collapse: Secondary | ICD-10-CM | POA: Diagnosis not present

## 2019-11-05 DIAGNOSIS — I10 Essential (primary) hypertension: Secondary | ICD-10-CM | POA: Diagnosis not present

## 2019-11-05 DIAGNOSIS — R58 Hemorrhage, not elsewhere classified: Secondary | ICD-10-CM | POA: Diagnosis not present

## 2019-11-07 DIAGNOSIS — T148XXA Other injury of unspecified body region, initial encounter: Secondary | ICD-10-CM | POA: Diagnosis not present

## 2019-11-07 DIAGNOSIS — D649 Anemia, unspecified: Secondary | ICD-10-CM | POA: Diagnosis not present

## 2019-11-07 DIAGNOSIS — R319 Hematuria, unspecified: Secondary | ICD-10-CM | POA: Diagnosis not present

## 2019-11-07 DIAGNOSIS — M6282 Rhabdomyolysis: Secondary | ICD-10-CM | POA: Diagnosis not present

## 2019-11-07 DIAGNOSIS — W19XXXA Unspecified fall, initial encounter: Secondary | ICD-10-CM | POA: Diagnosis not present

## 2019-11-14 DIAGNOSIS — M6282 Rhabdomyolysis: Secondary | ICD-10-CM | POA: Diagnosis not present

## 2019-11-14 DIAGNOSIS — D649 Anemia, unspecified: Secondary | ICD-10-CM | POA: Diagnosis not present

## 2019-11-14 DIAGNOSIS — R319 Hematuria, unspecified: Secondary | ICD-10-CM | POA: Diagnosis not present

## 2019-11-20 DIAGNOSIS — D649 Anemia, unspecified: Secondary | ICD-10-CM | POA: Diagnosis not present

## 2019-11-20 DIAGNOSIS — Z9981 Dependence on supplemental oxygen: Secondary | ICD-10-CM | POA: Diagnosis not present

## 2019-11-20 DIAGNOSIS — I10 Essential (primary) hypertension: Secondary | ICD-10-CM | POA: Diagnosis not present

## 2019-11-20 DIAGNOSIS — R55 Syncope and collapse: Secondary | ICD-10-CM | POA: Diagnosis not present

## 2019-11-23 DIAGNOSIS — K661 Hemoperitoneum: Secondary | ICD-10-CM | POA: Diagnosis not present

## 2019-11-23 DIAGNOSIS — I10 Essential (primary) hypertension: Secondary | ICD-10-CM | POA: Diagnosis not present

## 2019-11-23 DIAGNOSIS — K219 Gastro-esophageal reflux disease without esophagitis: Secondary | ICD-10-CM | POA: Diagnosis not present

## 2019-11-23 DIAGNOSIS — D509 Iron deficiency anemia, unspecified: Secondary | ICD-10-CM | POA: Diagnosis not present

## 2019-11-23 DIAGNOSIS — H409 Unspecified glaucoma: Secondary | ICD-10-CM | POA: Diagnosis not present

## 2019-11-28 DIAGNOSIS — K661 Hemoperitoneum: Secondary | ICD-10-CM | POA: Diagnosis not present

## 2019-11-28 DIAGNOSIS — K219 Gastro-esophageal reflux disease without esophagitis: Secondary | ICD-10-CM | POA: Diagnosis not present

## 2019-11-28 DIAGNOSIS — H409 Unspecified glaucoma: Secondary | ICD-10-CM | POA: Diagnosis not present

## 2019-11-28 DIAGNOSIS — D509 Iron deficiency anemia, unspecified: Secondary | ICD-10-CM | POA: Diagnosis not present

## 2019-11-28 DIAGNOSIS — I10 Essential (primary) hypertension: Secondary | ICD-10-CM | POA: Diagnosis not present

## 2019-12-05 DIAGNOSIS — K661 Hemoperitoneum: Secondary | ICD-10-CM | POA: Diagnosis not present

## 2019-12-05 DIAGNOSIS — I10 Essential (primary) hypertension: Secondary | ICD-10-CM | POA: Diagnosis not present

## 2019-12-05 DIAGNOSIS — D509 Iron deficiency anemia, unspecified: Secondary | ICD-10-CM | POA: Diagnosis not present

## 2019-12-05 DIAGNOSIS — H409 Unspecified glaucoma: Secondary | ICD-10-CM | POA: Diagnosis not present

## 2019-12-05 DIAGNOSIS — K219 Gastro-esophageal reflux disease without esophagitis: Secondary | ICD-10-CM | POA: Diagnosis not present

## 2019-12-11 DIAGNOSIS — K661 Hemoperitoneum: Secondary | ICD-10-CM | POA: Diagnosis not present

## 2019-12-11 DIAGNOSIS — I10 Essential (primary) hypertension: Secondary | ICD-10-CM | POA: Diagnosis not present

## 2019-12-11 DIAGNOSIS — H409 Unspecified glaucoma: Secondary | ICD-10-CM | POA: Diagnosis not present

## 2019-12-11 DIAGNOSIS — K219 Gastro-esophageal reflux disease without esophagitis: Secondary | ICD-10-CM | POA: Diagnosis not present

## 2019-12-11 DIAGNOSIS — D509 Iron deficiency anemia, unspecified: Secondary | ICD-10-CM | POA: Diagnosis not present

## 2020-02-14 DIAGNOSIS — H401131 Primary open-angle glaucoma, bilateral, mild stage: Secondary | ICD-10-CM | POA: Diagnosis not present

## 2020-06-18 DIAGNOSIS — H401132 Primary open-angle glaucoma, bilateral, moderate stage: Secondary | ICD-10-CM | POA: Diagnosis not present

## 2021-06-28 IMAGING — CT CT HEAD W/O CM
4 series · 16 of 47 positions shown, 18 images · non-contrast
Comparison: None.

CLINICAL DATA: 84-year-old female with trauma.

EXAM:
CT HEAD WITHOUT CONTRAST
CT MAXILLOFACIAL WITHOUT CONTRAST
CT CERVICAL SPINE WITHOUT CONTRAST
TECHNIQUE: Multidetector CT imaging of the head, cervical spine, and
maxillofacial structures were performed using the standard protocol
without intravenous contrast. Multiplanar CT image reconstructions
of the cervical spine and maxillofacial structures were also
generated.

[Series 3: head without · axial · non-contrast · 0.43mm/px · z∈[+1450,+1560]mm · 6 of 32 slices shown, 8 images]
[im 5/32  brain]
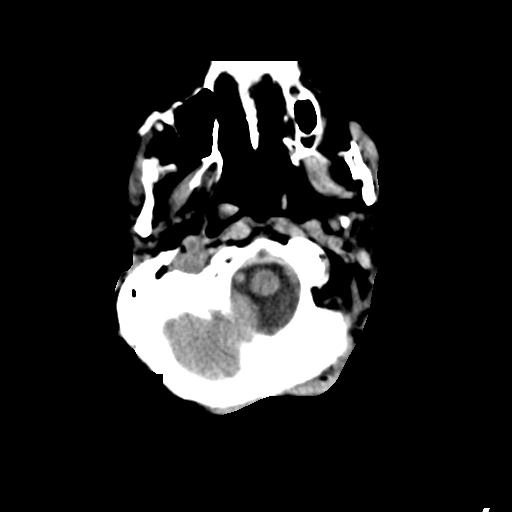
[im 5/32  bone]
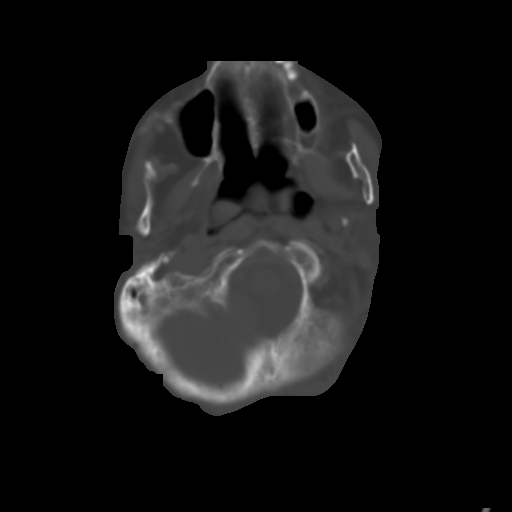
[im 9/32  brain]
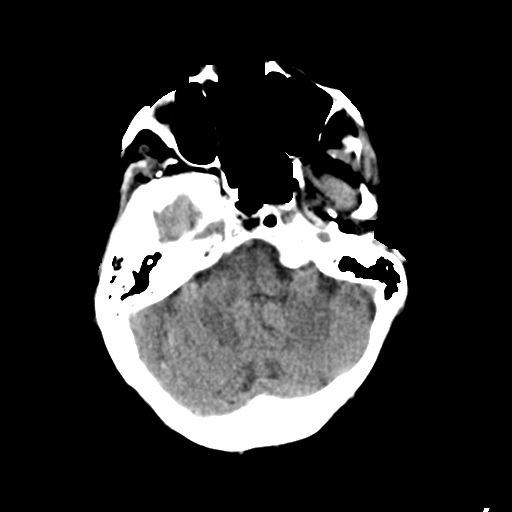
[im 14/32  brain]
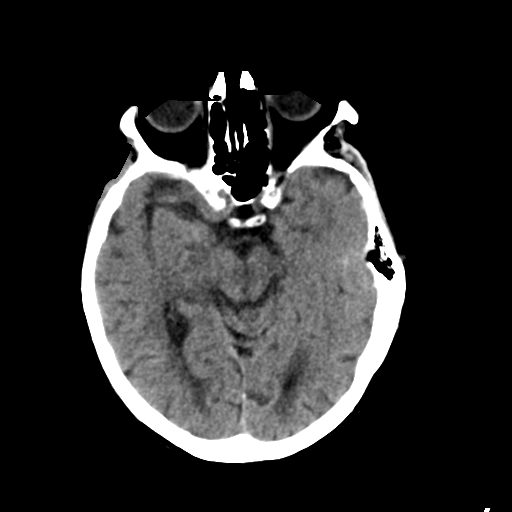
[im 18/32  brain]
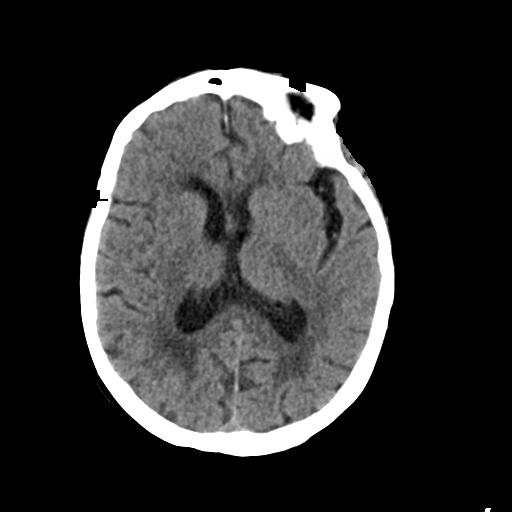
[im 23/32  brain]
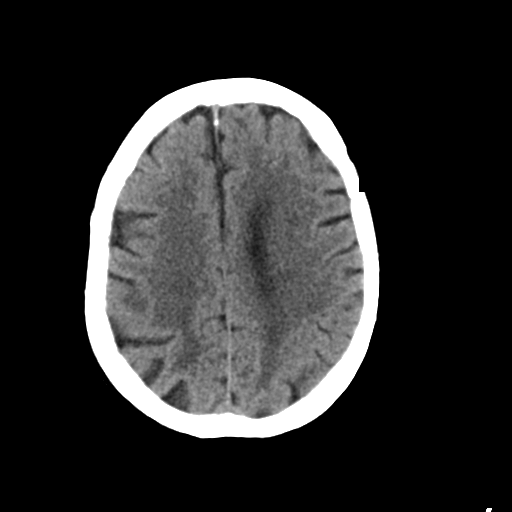
[im 23/32  bone]
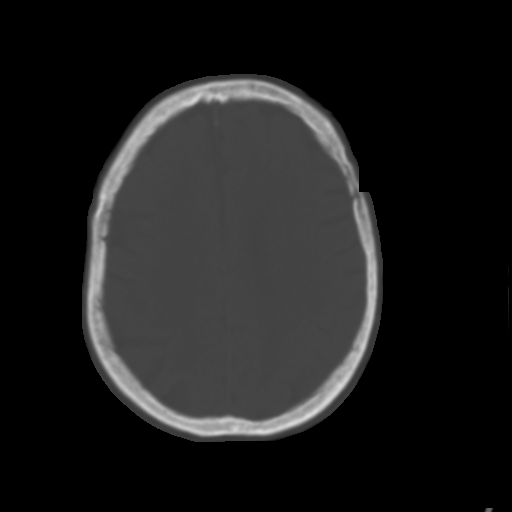
[im 27/32  brain]
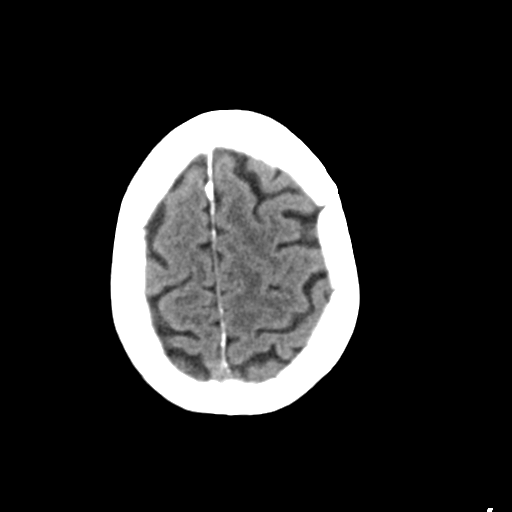

[Series 4: head bone · axial · 0.43mm/px · z∈[+1444,+1498]mm · 4 of 81 slices shown]
[im 8/81  bone]
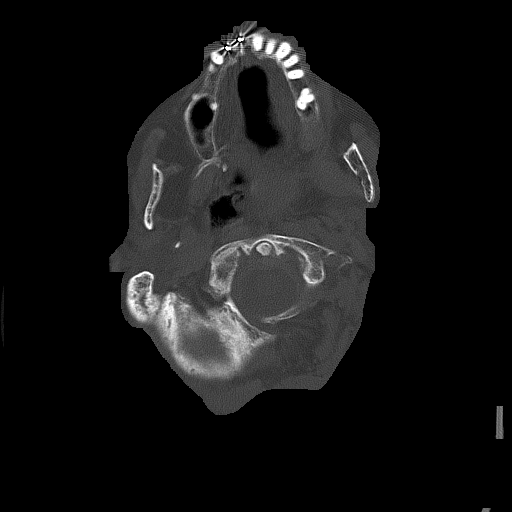
[im 16/81  bone]
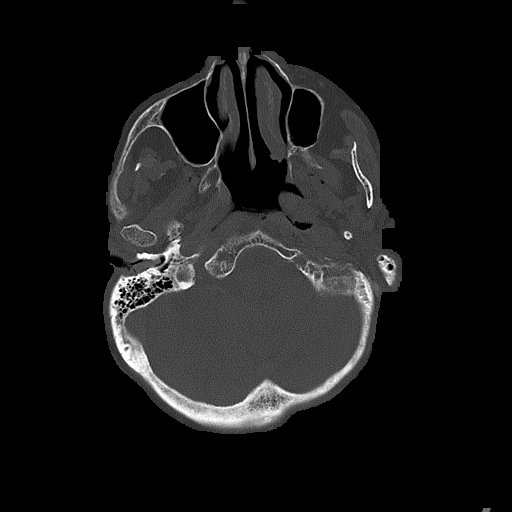
[im 27/81  bone]
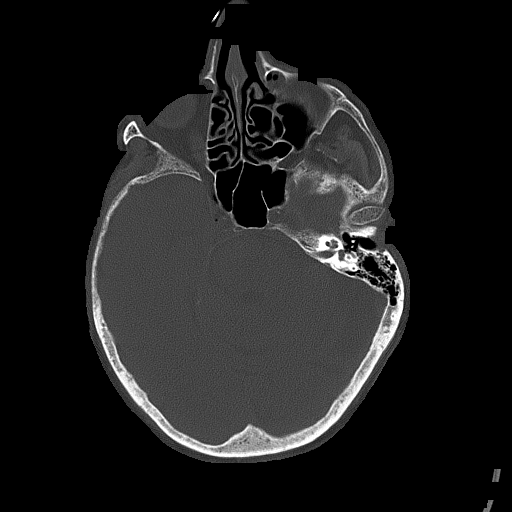
[im 35/81  bone]
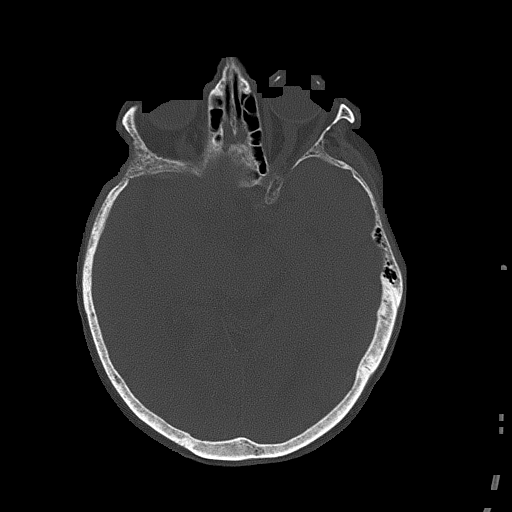

[Series 5: head without cor · coronal · non-contrast · 0.34mm/px · 3 of 67 slices shown]
[im 23/67  brain]
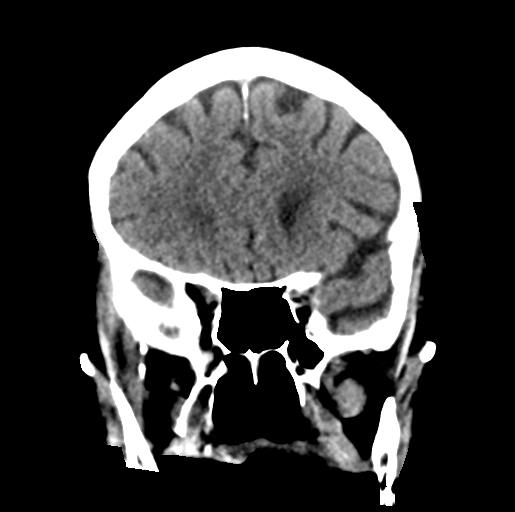
[im 30/67  brain]
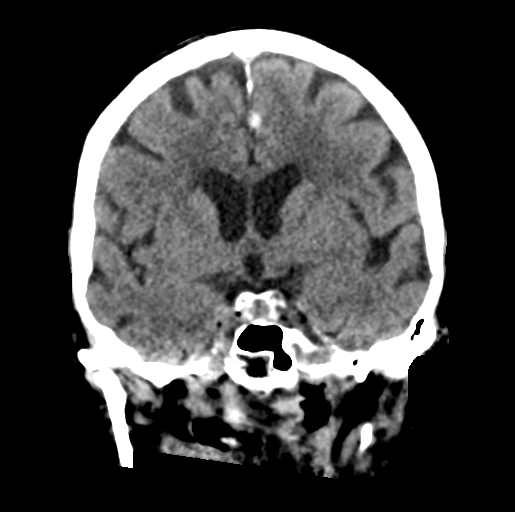
[im 37/67  brain]
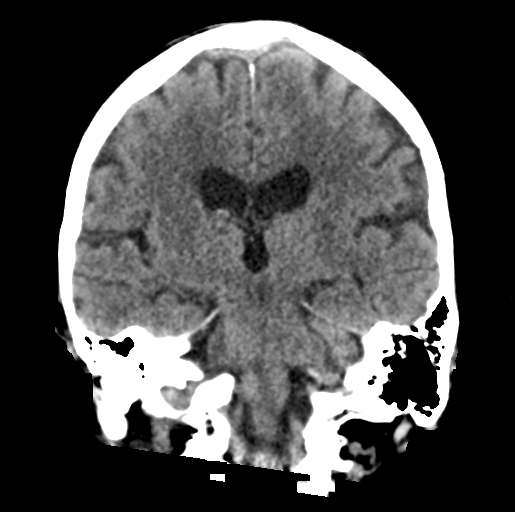

[Series 6: head without sag · sagittal · non-contrast · 0.34mm/px · 3 of 53 slices shown]
[im 18/53  brain]
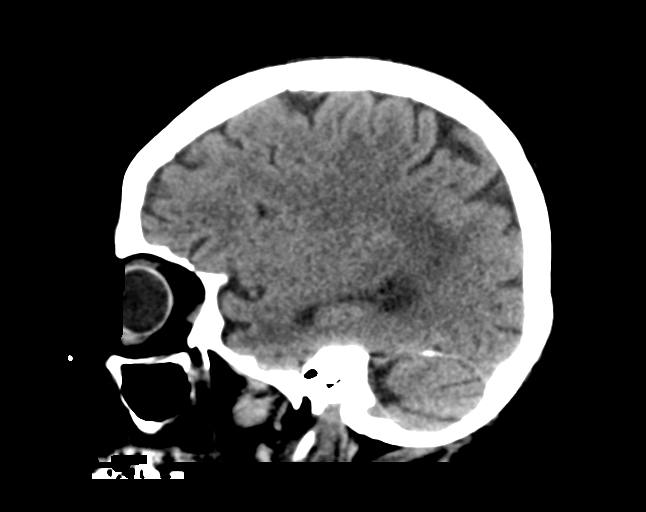
[im 27/53  brain]
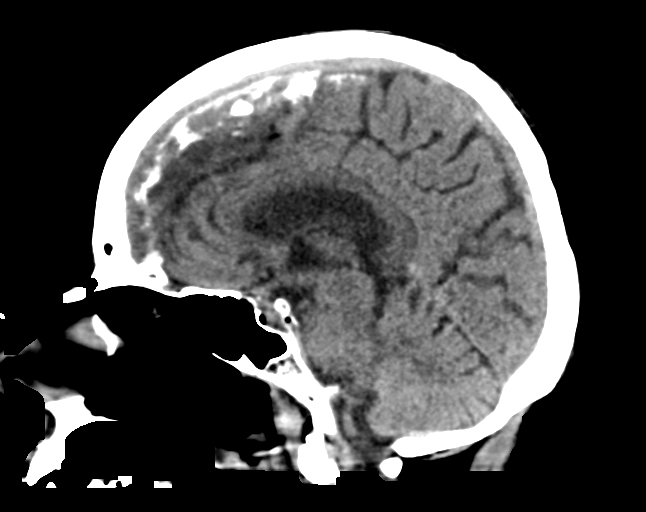
[im 35/53  brain]
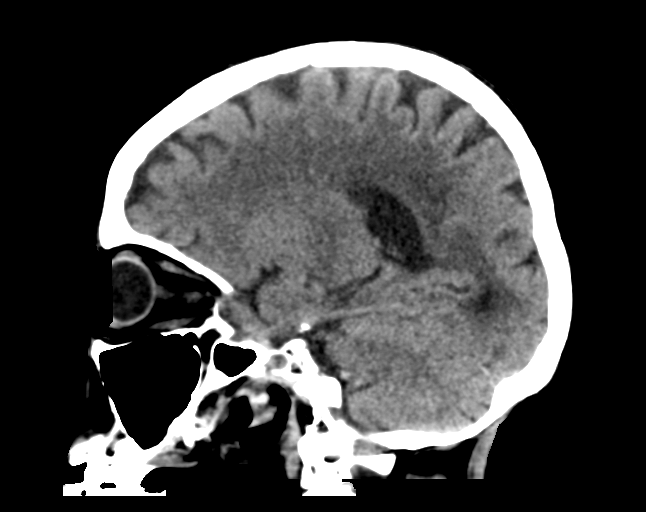

[16 of 47 positions shown; findings below may reference images not displayed]

FINDINGS: CT HEAD FINDINGS

Brain: Moderate age-related atrophy and chronic microvascular
ischemic changes. There is no acute intracranial hemorrhage. No mass
effect or midline shift. No extra-axial fluid collection. A 5 mm
rounded calcified structure to the right of the anterior falx may
represent a small calcified meningioma.

Vascular: No hyperdense vessel or unexpected calcification.

Skull: Normal. Negative for fracture or focal lesion.

Other: None

CT MAXILLOFACIAL FINDINGS

Osseous: No acute fracture. No mandibular subluxation

Orbits: The globes and retro-orbital fat are preserved.

Sinuses: Clear.

Soft tissues: Negative.

CT CERVICAL SPINE FINDINGS

Alignment: No acute subluxation.

Skull base and vertebrae: No acute fracture. Osteopenia.

Soft tissues and spinal canal: No prevertebral fluid or swelling. No
visible canal hematoma.

Disc levels:  No acute findings. Multilevel degenerative changes.

Upper chest: Emphysema.

Other: Bilateral carotid bulb calcified plaques.
IMPRESSION: 1. No acute intracranial hemorrhage. Age-related atrophy and chronic
microvascular ischemic changes.
2. No acute/traumatic cervical spine pathology.
3. No acute facial bone fractures.

## 2021-06-28 IMAGING — CT CT CHEST W/ CM
2 of 5 series · 12 of 36 positions shown, 15 images · IV contrast (omnipaque)
Comparison: None.

CLINICAL DATA: Fell yesterday, found down, hematemesis, syncope

EXAM:
CT CHEST, ABDOMEN, AND PELVIS WITH CONTRAST
TECHNIQUE: Multidetector CT imaging of the chest, abdomen and pelvis was
performed following the standard protocol during bolus
administration of intravenous contrast.
CONTRAST:  100mL OMNIPAQUE IOHEXOL 300 MG/ML  SOLN

[Series 3: cap with 5mm st · axial · 0.80mm/px · z∈[+824,+1304]mm · 9 of 121 slices shown, 12 images]
[im 13/121  mediastinal]
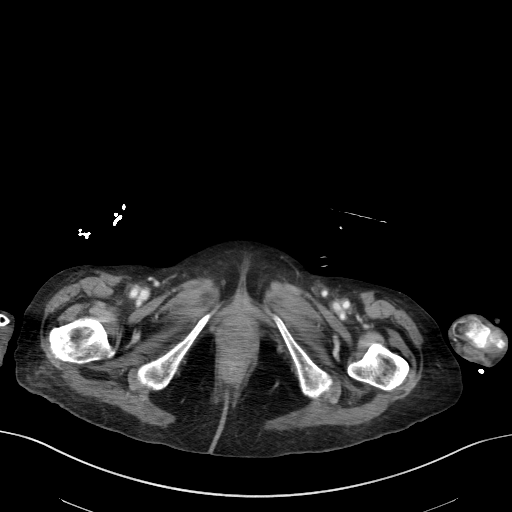
[im 13/121  lung]
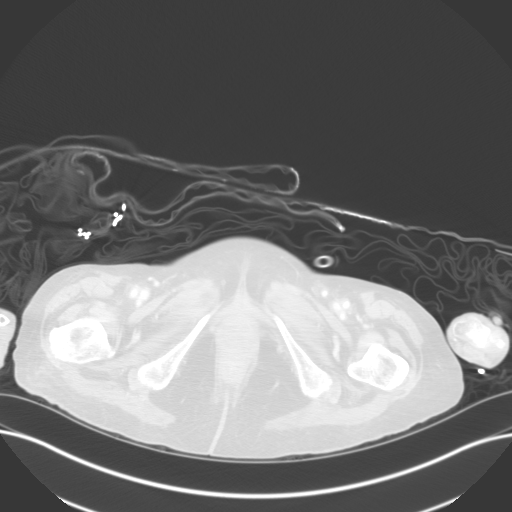
[im 25/121  lung]
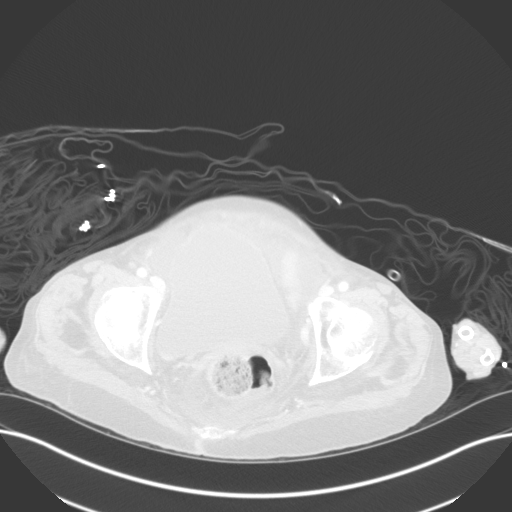
[im 37/121  lung]
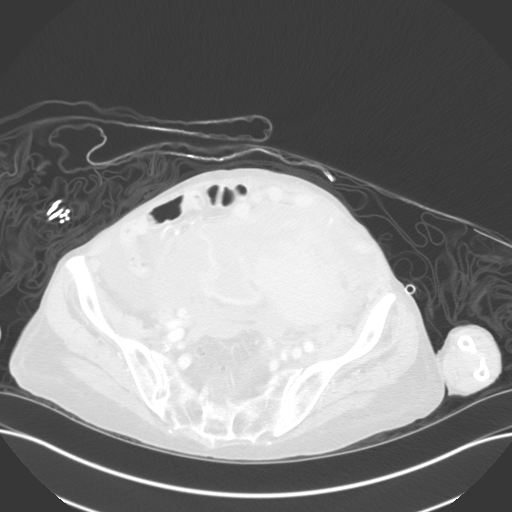
[im 49/121  lung]
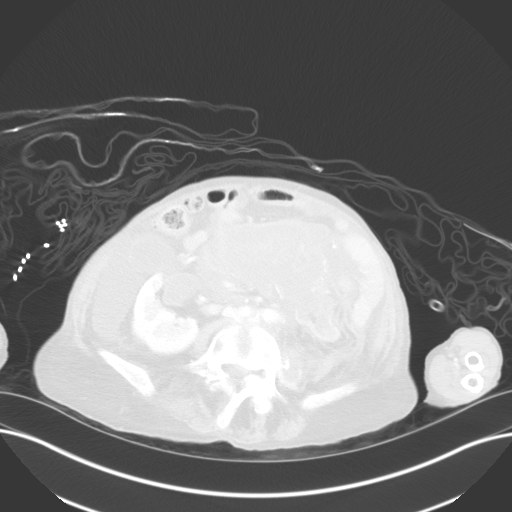
[im 61/121  mediastinal]
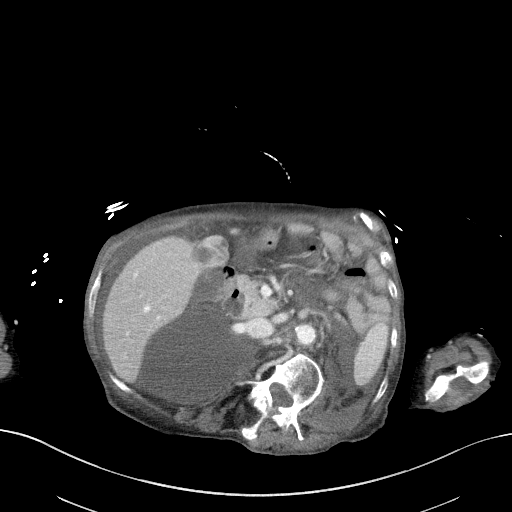
[im 61/121  lung]
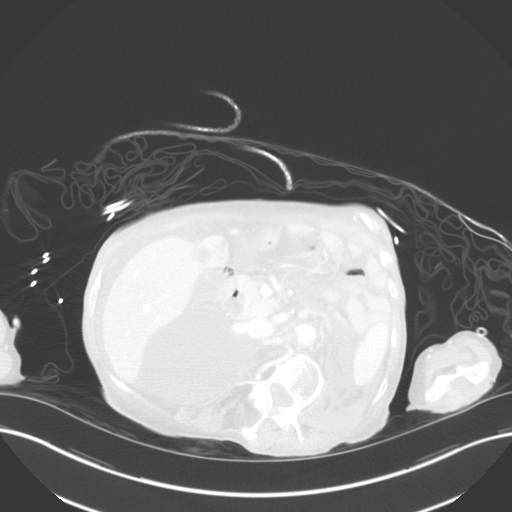
[im 73/121  lung]
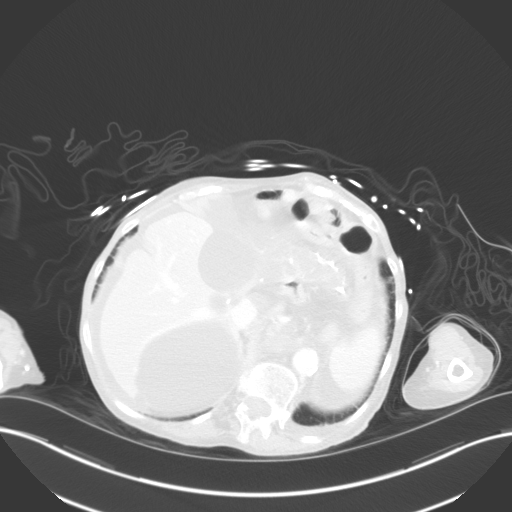
[im 85/121  lung]
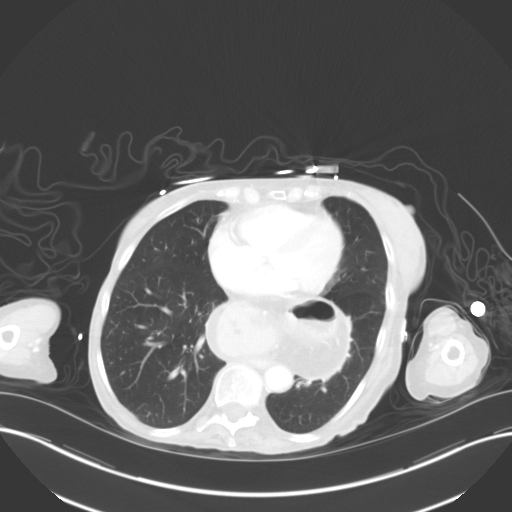
[im 97/121  lung]
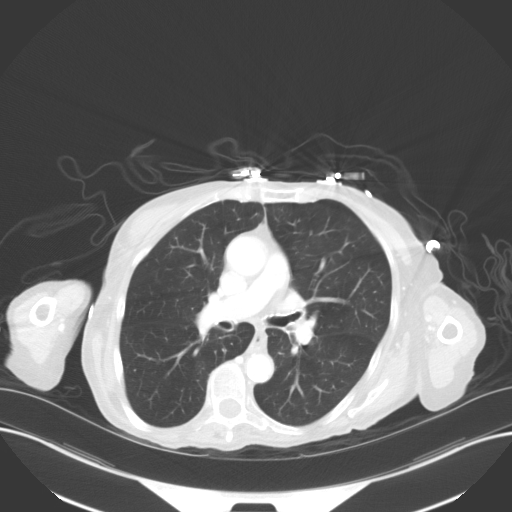
[im 109/121  mediastinal]
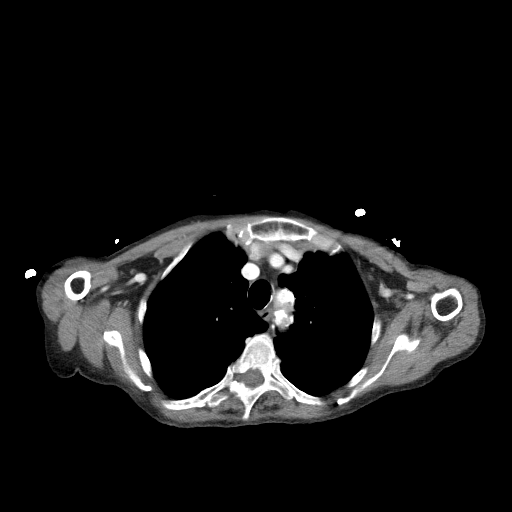
[im 109/121  lung]
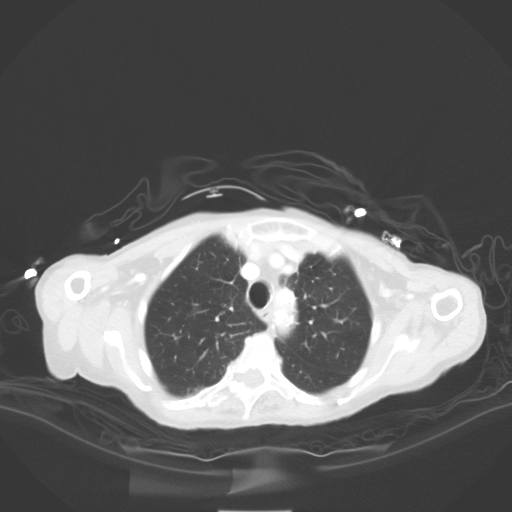

[Series 6: cap with 3mm st cor · coronal · 0.65mm/px · 3 of 141 slices shown]
[im 29/141  lung]
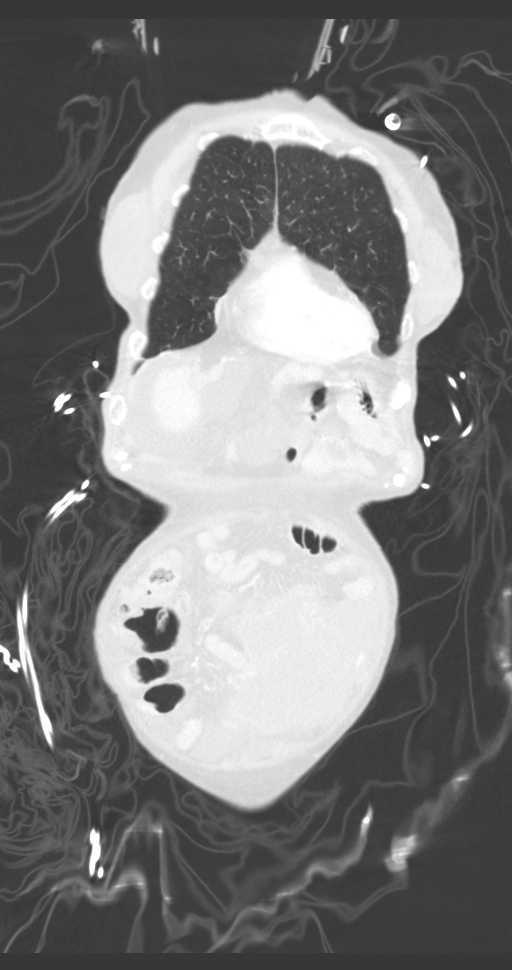
[im 57/141  lung]
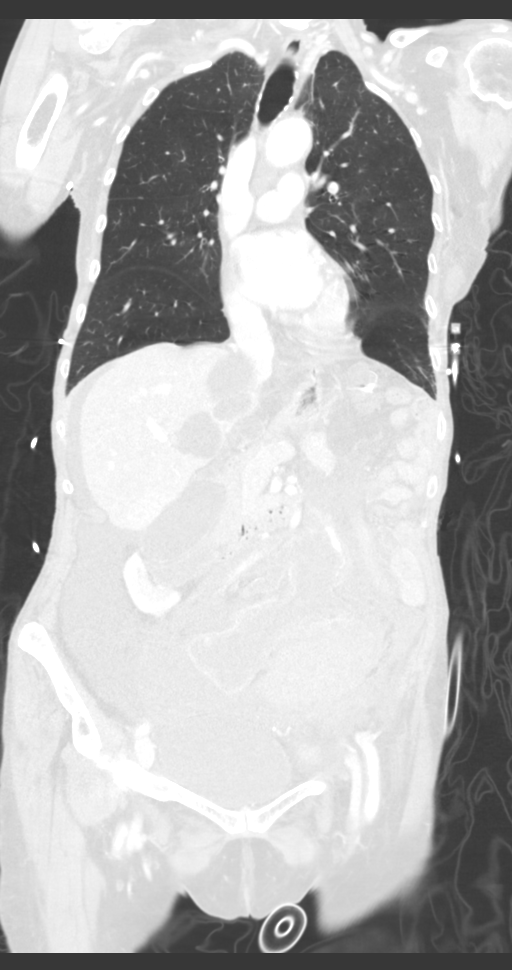
[im 85/141  lung]
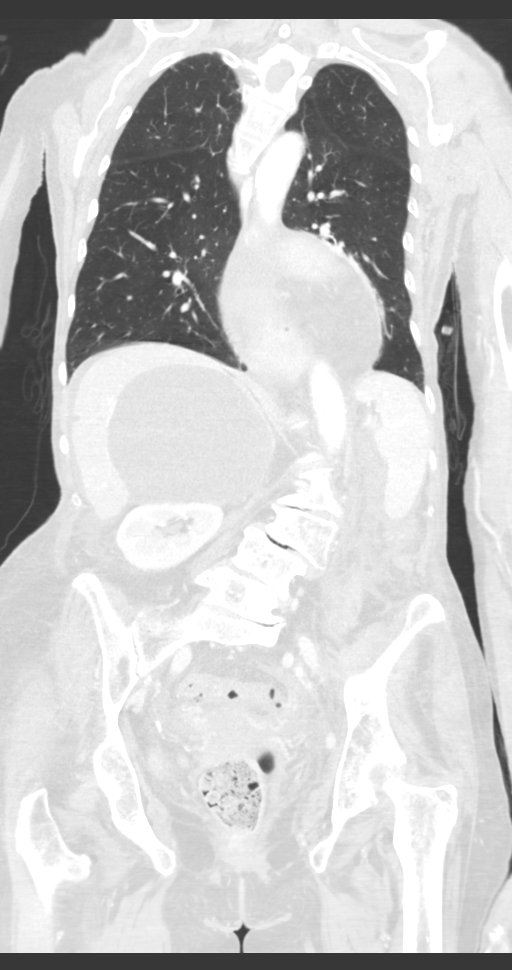

[12 of 36 positions shown; findings below may reference images not displayed]

FINDINGS: CT CHEST FINDINGS

Cardiovascular: Heart and great vessels are unremarkable without
pericardial effusion. Significant atherosclerosis of the aorta and
coronary vessels. No evidence of thoracic aortic aneurysm or
dissection.

Mediastinum/Nodes: There is a large hiatal hernia. 1 cm hypodensity
left lobe thyroid does not warrant further follow-up based on size
and patient age. Trachea and esophagus are unremarkable. No
pathologic adenopathy.

Lungs/Pleura: No airspace disease, effusion, or pneumothorax.
Central airways are patent.

Musculoskeletal: No acute displaced fractures. Rotatory scoliosis of
the thoracolumbar spine. Reconstructed images demonstrate no
additional findings.

CT ABDOMEN PELVIS FINDINGS

Hepatobiliary: There are innumerable hepatic cysts replacing the
majority of the left lobe of the liver. Gallbladder is moderately
distended small calcified gallstones. No evidence of cholecystitis.

Pancreas: Unremarkable. No pancreatic ductal dilatation or
surrounding inflammatory changes.

Spleen: No splenic injury or perisplenic hematoma.

Adrenals/Urinary Tract: The left kidney has been removed in the
interim. Large extrarenal pelvis right kidney again seen. No
evidence of hydronephrosis. Nonobstructing 7 mm calculus lower pole
right kidney.

The bladder is grossly unremarkable. The adrenals are not
identified.

Stomach/Bowel: There is a large hiatal hernia with the majority of
the stomach in an intrathoracic position. There is no bowel
obstruction or ileus. Diverticulosis of the sigmoid colon without
diverticulitis.

Vascular/Lymphatic: There is extensive atherosclerosis of the aorta
and its distal branches. No pathologic adenopathy.

Reproductive: Uterus is atrophic.  No adnexal masses.

Other: Heterogeneous free fluid is seen throughout the abdomen and
pelvis. Fluid in the right flank is fairly simple in appearance
compatible with bland ascites.

Heterogeneous fluid of mixed attenuation within the central abdomen
and pelvis extends approximately 16.7 x 10.8 cm in transverse
dimension, consistent with intraperitoneal hemorrhage and likely
clot. An underlying mesenteric mass would be difficult to exclude
delayed imaging through this region does not demonstrate any pooling
of contrast to suggest ongoing hemorrhage.

There is no free intra-abdominal gas.  No abdominal wall hernia.

Musculoskeletal: No acute displaced fractures. Reconstructed images
demonstrate no additional findings.
IMPRESSION: 1. Heterogeneous process in the central abdomen and pelvis, likely a
large mesenteric hematoma. No contrast extravasation to suggest
active hemorrhage. Underlying mass in this region cannot be
excluded, and appropriate follow-up will be required.
2. No acute intrathoracic trauma.
3. Nonobstructing right renal calculus. The left kidney is not
identified and likely surgically absent.
4. Innumerable hepatic cysts.
5. Large hiatal hernia.
6. 1 cm left lobe thyroid nodule. Not clinically significant; no
follow-up imaging recommended. (Ref: [HOSPITAL]. [DATE]): 143-50).
7.  Aortic Atherosclerosis (FKCNP-GNL.L).

These results were called by telephone at the time of interpretation
on 10/30/2019 at [DATE] to provider KATRI-HELENA BROMAN , who verbally
acknowledged these results.

## 2024-05-23 ENCOUNTER — Emergency Department (HOSPITAL_COMMUNITY)

## 2024-05-23 ENCOUNTER — Other Ambulatory Visit: Payer: Self-pay

## 2024-05-23 ENCOUNTER — Emergency Department (HOSPITAL_COMMUNITY)
Admission: EM | Admit: 2024-05-23 | Discharge: 2024-05-23 | Disposition: A | Discharge status: Home / Self Care | Attending: Emergency Medicine | Admitting: Emergency Medicine

## 2024-05-23 DIAGNOSIS — M25531 Pain in right wrist: Secondary | ICD-10-CM | POA: Diagnosis not present

## 2024-05-23 DIAGNOSIS — Y92002 Bathroom of unspecified non-institutional (private) residence single-family (private) house as the place of occurrence of the external cause: Secondary | ICD-10-CM | POA: Insufficient documentation

## 2024-05-23 DIAGNOSIS — M25511 Pain in right shoulder: Secondary | ICD-10-CM | POA: Insufficient documentation

## 2024-05-23 DIAGNOSIS — M79652 Pain in left thigh: Secondary | ICD-10-CM | POA: Insufficient documentation

## 2024-05-23 DIAGNOSIS — M79651 Pain in right thigh: Secondary | ICD-10-CM | POA: Diagnosis not present

## 2024-05-23 DIAGNOSIS — M25551 Pain in right hip: Secondary | ICD-10-CM | POA: Diagnosis not present

## 2024-05-23 DIAGNOSIS — W1811XA Fall from or off toilet without subsequent striking against object, initial encounter: Secondary | ICD-10-CM | POA: Insufficient documentation

## 2024-05-23 DIAGNOSIS — M25552 Pain in left hip: Secondary | ICD-10-CM | POA: Diagnosis not present

## 2024-05-23 DIAGNOSIS — W19XXXA Unspecified fall, initial encounter: Secondary | ICD-10-CM

## 2024-05-23 NOTE — ED Notes (Signed)
 This RN contacted alternate contact on file for pts discharge but went straight to voicemail. This RN called pts son Cherylin) and was informed he lives in [place] and would need to contact pts daughter for pickup but it may be a while. Charge RN made aware.

## 2024-05-23 NOTE — ED Notes (Signed)
 SW at bedside.

## 2024-05-23 NOTE — ED Provider Notes (Signed)
 [facility] DEPT [facility] Emergency Department Provider Note MRN:  [id number]  Arrival date & time: 05/23/24     Chief Complaint   Fall   History of Present Illness   Jacqueline Mathews is a 89 y.o. year-old female with no pertinent past medical presenting to the ED with chief complaint of fall.  Patient explains that she had trouble getting off the commode and then fell.  Having pain to the right shoulder and right wrist.  Denies head trauma, no loss consciousness, no neck or back pain, no chest pain or shortness of breath.  Review of Systems  A thorough review of systems was obtained and all systems are negative except as noted in the HPI and PMH.   Patient's Health History   No past medical history on file.  No past surgical history on file.  Family History  Problem Relation Age of Onset   Colon cancer Mother     Social History   Socioeconomic History   Marital status: Widowed    Spouse name: Not on file   Number of children: Not on file   Years of education: Not on file   Highest education level: Not on file  Occupational History   Not on file  Tobacco Use   Smoking status: Never   Smokeless tobacco: Never  Substance and Sexual Activity   Alcohol use: No    Alcohol/week: 0.0 standard drinks of alcohol   Drug use: No   Sexual activity: Not on file  Other Topics Concern   Not on file  Social History Narrative   Not on file   Social Drivers of Health   Financial Resource Strain: Not on file  Food Insecurity: Not on file  Transportation Needs: Not on file  Physical Activity: Not on file  Stress: Not on file  Social Connections: Not on file  Intimate Partner Violence: Not on file     Physical Exam   Vitals:   05/23/24 0117  BP: (!) 198/99  Pulse: 81  Resp: 20  Temp: 98 F (36.7 C)  SpO2: 99%    CONSTITUTIONAL: Chronically ill-appearing, NAD NEURO/PSYCH:  Alert and oriented x 3, no focal deficits EYES:  eyes equal and reactive ENT/NECK:   no LAD, no JVD CARDIO: Regular rate, well-perfused, normal S1 and S2 PULM:  CTAB no wheezing or rhonchi GI/GU:  non-distended, non-tender MSK/SPINE:  No gross deformities, no edema SKIN:  no rash, atraumatic   *Additional and/or pertinent findings included in MDM below  Diagnostic and Interventional Summary    EKG Interpretation Date/Time:    Ventricular Rate:    PR Interval:    QRS Duration:    QT Interval:    QTC Calculation:   R Axis:      Text Interpretation:         Labs Reviewed - No data to display  DG Pelvis 1-2 Views  Final Result    DG Femur Min 2 Views Right  Final Result    DG Femur Min 2 Views Left  Final Result    DG Shoulder Right  Final Result    DG Wrist Complete Right  Final Result      Medications - No data to display   Procedures  /  Critical Care Procedures  ED Course and Medical Decision Making  Initial Impression and Ddx Deformity to the right wrist, pain elicited with any range of motion of the right shoulder.  Patient also has pain to the bilateral femurs and  hips.  Question pelvic fracture.  Awaiting x-rays.  No evidence of head trauma, not anticoagulated.  Past medical/surgical history that increases complexity of ED encounter: None  Interpretation of Diagnostics X-ray imaging is without acute traumatic injury, patient has chronic right wrist issues.  Patient Reassessment and Ultimate Disposition/Management     Patient ambulating at her baseline with walker without issue.  Attempted to call her daughter a few times, no answer.  Plan is for discharge pending a ride from family.  Patient management required discussion with the following services or consulting groups:  None  Complexity of Problems Addressed Acute illness or injury that poses threat of life of bodily function  Additional Data Reviewed and Analyzed Further history obtained from: None  Additional Factors Impacting ED Encounter Risk None  Ozell HERO. Theadore,  MD [facility] Emergency Medicine [facility] [email or web address]  Final Clinical Impressions(s) / ED Diagnoses     ICD-10-CM   1. Fall, initial encounter  W19.[facility]       ED Discharge Orders     None        Discharge Instructions Discussed with and Provided to Patient:     Discharge Instructions      You were evaluated in the Emergency Department and after careful evaluation, we did not find any emergent condition requiring admission or further testing in the hospital.  Your exam/testing today was overall reassuring.  X-rays did not show any broken bones.  Recommend Tylenol  for any lingering discomfort.  Please return to the Emergency Department if you experience any worsening of your condition.  Thank you for allowing us  to be a part of your care.        Theadore Ozell HERO, MD 05/23/24 [phone]

## 2024-05-23 NOTE — ED Notes (Signed)
 Patient ambulated in the hall and back to stretcher with walker and standby assistance. Patient reports some pain in the hip. Denies dizziness.

## 2024-05-23 NOTE — ED Notes (Addendum)
 Patient unable to tell RN if she needs help getting into home. DO made aware. When this RN asked patient if she used a cane or walker at home, patient answered I go to the laundromat. Patient unable to note how many steps going into her house.

## 2024-05-23 NOTE — ED Triage Notes (Addendum)
 BIB [facility] from home where EMS was called r/t welfare check after approx 30 hours of no communication. EMS reports that pt was found beside toilet in bathroom. Unsure of how long pt was down. Rt wrist and rt shoulder pain. Pt lives at home alone  BP 195/115 HR 80s RR 16 Spo2 96. CBG 167

## 2024-05-23 NOTE — Discharge Instructions (Signed)
 You were evaluated in the Emergency Department and after careful evaluation, we did not find any emergent condition requiring admission or further testing in the hospital.  Your exam/testing today was overall reassuring.  X-rays did not show any broken bones.  Recommend Tylenol  for any lingering discomfort.  Please return to the Emergency Department if you experience any worsening of your condition.  Thank you for allowing us  to be a part of your care.

## 2024-05-23 NOTE — ED Notes (Signed)
 Patient waiting for daughter from [facility] to pick her up.

## 2024-05-23 NOTE — ED Notes (Signed)
 SW contacted by RN to determine how patient will get home

## 2024-05-23 NOTE — ED Notes (Signed)
 Talked to Luke niece- brother is on way to pick patient up

## 2024-05-23 NOTE — Progress Notes (Signed)
 CSW spoke w/ pt brother Zell. Pt brother is coming to pick up pt. Brother is coming from [place] so it will take a 5/6hrs before he arrives.

## 2024-05-23 NOTE — ED Notes (Signed)
 Per SW, brother is coming to pick patient up. DO made aware.

## 2024-06-01 NOTE — Discharge Summary (Addendum)
 [facility]  Discharge Summary   Name: Jacqueline Mathews Age: 89 yrs  MRN: [id number] DOB: 12/23/1934  Admit Date: 05/28/2024 Admitting Physician: Jennings Graciela Keeler, MD  Discharge Date: 06/01/2024 Discharge Physician: Dorise Donnice Hammers, MD    Discharge Diagnoses:   Principal Problem (Resolved):   AKI (acute kidney injury) Active Problems:   Physical deconditioning   Other chest pain   Glaucoma   Large hiatal hernia   Esophageal dysphagia Resolved Problems:   Severe dehydration   Acute cystitis with hematuria   High anion gap metabolic acidosis   Hypernatremia   Hypokalemia   Acute encephalopathy   Primary hypertension   TO DO List at Follow-up for PCP/Specialist:   Continue amlodipine  10 mg daily for hypertension Continue PPI for GI symptoms Please place surgical referral for management of her large hiatal hernia if this is in line with her goals Daily MiraLAX  for constipation plus as needed senna Remove Foley catheter and perform voiding trial     Hospital Course:   For full details, please see H&P, progress notes, consult notes and ancillary notes. Briefly, Jacqueline Mathews is an 89 year old female with a history of glaucoma who was admitted after being found down at her daughter's home, presenting with severe dehydration and acute kidney injury (AKI). On admission, she was noted to be cachectic, lethargic, and had laboratory evidence of hypernatremia, hypokalemia, elevated BUN and creatinine, and leukocytosis; urinalysis was consistent with infection, and imaging was notable for a large hiatal hernia, duodenal wall thickening, and osteopenia but no acute intracranial or thoracic findings.  Her principal problem was AKI secondary to dehydration, confirmed by elevated creatinine and BUN, hypernatremia, and clinical findings of poor oral intake and prolonged immobility. Management included intravenous fluids, strict monitoring of intake and output, and daily  laboratory assessment of renal function and electrolytes, with improvement in creatinine to 0.74 by 12/4. Electrolyte abnormalities resolved with ongoing hypotonic fluids and close monitoring.  She was also treated for acute cystitis with hematuria, supported by urinalysis and leukocytosis, with a three-day course of intravenous ceftriaxone. Physical deconditioning was addressed with physical and occupational therapy evaluations, which documented severe impairment in mobility and activities of daily living, recommending discharge to a skilled nursing facility for ongoing rehabilitation.  During her hospitalization, she experienced acute encephalopathy, likely multifactorial from infection and metabolic derangements, which resolved with treatment and supportive care including delirium precautions and frequent re-orientation. She reported intermittent chest pain; cardiac workup was negative for ischemia. Initial EKG with possible a-fib but this was resolved on subsequent EKG and no other evidence of a-fib during hospitalization. Her chest pain was attributed to her large hiatal hernia, and she was started on a proton pump inhibitor with plans for surgical referral after discharge. Esophageal dysphagia was noted, with speech therapy evaluation recommending regular solids and thin liquids, and no skilled swallowing therapy needs at discharge; however, cognitive deficits were identified, and ongoing cognitive-linguistic therapy was recommended for memory and attention impairment.  She was found to have severe protein-calorie malnutrition (BMI 14.95), with poor oral intake and muscle/fat depletion; nutrition interventions included regular diet, medical food supplements, and a switch from Ensure Plus to Ensure Clear and Magic Cup to improve tolerance. Her blood pressure was managed with amlodipine , and hydralazine was discontinued after improvement. Glaucoma management was resumed with her home eyedrops.  Towards  the end of her hospitalization she developed acute urinary retention with bladder scan showing > of urine.  In-N-Out catheter with 450 mL but bladder  scan continue to indicate more urine in the bladder so foley catheter was placed with only 50 mL out.  I suspect that the bladder scanner was inaccurate as there is no signs of obstructive uropathy based on her creatinine which continues to improve. Will plan to discharge with Foley catheter in place and have facility remove Foley catheter and perform voiding trial.  Additional issues addressed included urinary incontinence, compromised skin integrity, and fall risk, with appropriate nursing interventions and DVT prophylaxis. Discharge planning involved coordination for transfer to a skilled nursing facility, with APS referral accepted and family notified. At discharge, she remained alert and oriented, with improved renal function, resolved infection, and ongoing needs for rehabilitation and cognitive therapy.  The patient's chronic medical conditions were treated accordingly per the patient's home medication regimen except as noted in the plan above and in the medication list below.    Discharge Condition:   Disposition: Patient discharged to Skilled Nursing Facility (CMS Certified) in fair condition.  Diet at discharge:    Dietary Orders  (From admission, onward)               [employer]; Daily with Lunch  Once       Question Answer Comment  [facility]: Magic Cup   Frequency: Daily with Lunch      05/31/24 1140    Ensure Clear; Three times daily with Meals  Once       Question Answer Comment  [facility]: Ensure Clear   Frequency: Three times daily with Meals      05/31/24 1140    Adult Diet- Regular  Diet effective now       References:    Medical Nutrition Management (MNM) for Registered Dietitian  Question Answer Comment  Diet type: Regular   Medical Nutrition Management By RD Yes, Medical Nutrition Management By RD       05/28/24 1634            Activity at Discharge: Ambulate ad lib      Physical Exam at Discharge   BP (!) 161/83 (BP Location: Right arm, Patient Position: Lying)   Pulse 85   Temp 97.7 F (36.5 C) (Oral)   Resp 18   Ht 1.626 m (5' 4)   Wt 39.5 kg (87 lb 1.3 oz)   SpO2 96%   BMI 14.95 kg/m  GEN: NAD, lying in bed CV: RRR, no murmurs appreciated PULM: CTA B PSYCH: A+Ox4, appropriate  Discharge Medications:      Medication List     START taking these medications    amLODIPine  10 mg tablet Commonly known as: NORVASC  Take 1 tablet (10 mg total) by mouth daily. Start taking on: June 02, 2024   magnesium oxide 400 mg (241 mg magnesium) Tab Take 1 tablet (400 mg total) by mouth daily.   pantoprazole  40 mg EC tablet Commonly known as: PROTONIX  Take 1 tablet (40 mg total) by mouth every morning before breakfast.   polyethylene glycol 17 gram packet Commonly known as: GLYCOLAX  Take 17 g by mouth daily. Start taking on: June 02, 2024   sennosides-docusate sodium  8.6-50 mg per tablet Commonly known as: PERICOLACE Take 2 tablets by mouth 2 (two) times a day as needed for constipation.       CONTINUE taking these medications    latanoprost  0.005 % ophthalmic solution Commonly known as: XALATAN  Administer 1 drop into both eyes at bedtime.   timolol  0.5 % ophthalmic solution Commonly known as:  TIMOPTIC  Administer 1 drop into both eyes 2 (two) times a day.         Where to Get Your Medications     Information about where to get these medications is not yet available   Ask your nurse or doctor about these medications amLODIPine  10 mg tablet magnesium oxide 400 mg (241 mg magnesium) Tab pantoprazole  40 mg EC tablet polyethylene glycol 17 gram packet sennosides-docusate sodium  8.6-50 mg per tablet     Significant Diagnostic Tests:   LABS:  Lab Results  Component Value Date   WBC 9.50 06/01/2024   HGB 14.5 06/01/2024   HCT 43.6  06/01/2024   PLT 255 06/01/2024   ALT 17 05/28/2024   AST 14 05/28/2024   NA 144 06/01/2024   K 3.5 06/01/2024   CL 111 (H) 06/01/2024   CREATININE 0.69 06/01/2024   BUN 28 (H) 06/01/2024   CO2 25 06/01/2024   IMAGING:  CT Head WO Contrast W Quant CT Tiss Character When Performed  Final Result by Bettina Counter, MD (12/01 1712)  Addendum (preliminary) 1 of 1 by Bettina Counter, MD (12/01 1712)  ADDENDUM #1   Left basal ganglia appears more prominent compared to the right. Subtle   mass in this region is not entirely excluded; consider MR if there is   continued concern.     findings were discussed with PA Hutchinson at 5:11pm EST on   12.1.25               ORIGINAL REPORT   CT HEAD WITHOUT CONTRAST, 05/28/2024 1:18 PM    INDICATION: fall     COMPARISON: None    TECHNIQUE: Axial CT images of the brain from skull base to vertex,   including portions of the face and sinuses, were obtained without   contrast. Supplemental 2D reformatted images were generated and reviewed   as needed.    All CT scans at [facility] and [facility]   Imaging are performed using radiation dose optimization techniques as   appropriate to a performed exam, including but not limited to one or more   of the following: automatic exposure control, adjustment of the mA and/or   kV according to patient size, use of iterative reconstruction technique.   In addition, our institution participates in a radiation dose monitoring   program to optimize patient radiation exposure.    FINDINGS:  Calvarium/skull base: No evidence of acute fracture or destructive lesion.   Mastoids and middle ears demonstrate no substantial mucosal disease.    Paranasal sinuses: No air fluid levels.    Brain: No acute large vascular territory infarct. No mass effect. No   hydrocephalus. No acute hemorrhage. Moderate chronic microvascular   ischemic changes. Moderate parenchymal volume loss.     Bilateral lens surgeries.    IMPRESSION:  No acute intracranial abnormality.    Final  CT HEAD WITHOUT CONTRAST, 05/28/2024 1:18 PM    INDICATION: fall     COMPARISON: None    TECHNIQUE: Axial CT images of the brain from skull base to vertex,   including portions of the face and sinuses, were obtained without   contrast. Supplemental 2D reformatted images were generated and reviewed   as needed.    All CT scans at [facility] and [facility]   Imaging are performed using radiation dose optimization techniques as   appropriate to a performed exam, including but not limited to one or more  of the following: automatic exposure control, adjustment of the mA and/or   kV according to patient size, use of iterative reconstruction technique.   In addition, our institution participates in a radiation dose monitoring   program to optimize patient radiation exposure.    FINDINGS:  Calvarium/skull base: No evidence of acute fracture or destructive lesion.   Mastoids and middle ears demonstrate no substantial mucosal disease.    Paranasal sinuses: No air fluid levels.    Brain: No acute large vascular territory infarct. No mass effect. No   hydrocephalus. No acute hemorrhage. Moderate chronic microvascular   ischemic changes. Moderate parenchymal volume loss.    Bilateral lens surgeries.    IMPRESSION:  No acute intracranial abnormality.    CT Chest WO Contrast  Final Result by Bonna Stallion, MD (12/01 1543)  CT CHEST WO CONTRAST, 05/28/2024 1:18 PM    INDICATION: fall     COMPARISON: None    TECHNIQUE: Multislice axial images were obtained through the chest without   administration of iodinated intravenous contrast material. Multi-planar   reformatted images were generated for additional analysis. Nongated   technique limits cardiac detail.    All CT scans at [facility] and [facility]   Imaging are performed  using radiation dose optimization techniques as   appropriate to a performed exam, including but not limited to one or more   of the following: automatic exposure control, adjustment of the mA and/or   kV according to patient size, use of iterative reconstruction technique.   In addition, our institution participates in a radiation dose monitoring   program to optimize patient radiation exposure.    FINDINGS:     Tubes and Lines:     Thoracic inlet/central airways: Visualized thyroid  gland is normal.    Central airway is patent.  No cervical lymphadenopathy.     Mediastinum/hila/axilla: No adenopathy.    Heart/vessels: Normal heart size. No pericardial effusion.  There is   moderate coronary artery calcification.     Aorta is normal in caliber.  No atherosclerotic calcification.     Main pulmonary artery is not dilated.     Lungs/pleura: Biapical pleural-parenchymal scarring. Focal branching   endobronchial calcification in the left lower lobe, likely remote. No   dominant or suspicious nodules, focal consolidation, effusion, or   pneumothorax.     Upper abdomen: Duodenal wall thickening. Likely duodenal diverticulum..   Large hiatal hernia containing all of the stomach. Multiple left hepatic   and a few right hepatic cysts of varying complexity, some with peripheral   calcifications. Partially imaged 12 cm cyst in Morison's pouch may be   hepatic or renal. Calcified splenic granulomas.     Chest wall/MSK: Remote left clavicular fracture. No acute osseous findings   or aggressive osseous lesion.       IMPRESSION:    1.  No acute findings in the thorax.  2.  Large hiatal hernia containing all of the stomach and first portion   duodenum.  3.  Duodenal wall thickening which could reflect duodenitis.  4.  Numerous left hepatic lobe cysts and large right upper quadrant cyst   that may be hepatic or renal.      XR Chest 1 View  Final Result by Bettina Counter, MD (12/01 1244)   XR CHEST 1 VIEW, 05/28/2024 11:30 AM    INDICATION:GENERALIZED WEAKNESS   COMPARISON: none    FINDINGS:   Cardiovascular/lungs/pleura: Cardiac silhouette and pulmonary vasculature  are within normal limits.  calcified aortic arch. Hyperinflated lungs may   reflect COPD/pulmonary emphysema. No focal consolidations.  Other: Unremarkable.    IMPRESSION:  Hyperinflated lungs may reflect COPD/pulmonary emphysema. No focal   consolidations.      XR Pelvis 1-2 Views  Final Result by Roena Hy, MD (12/01 1243)  X-RAY PELVIS (1-2 VIEWS), 05/28/2024 11:30 AM    INDICATION: fall   COMPARISON: None.    IMPRESSION:  1.  No acute fracture or traumatic malalignment.   2.  Moderate degenerative changes of the hips, SI joints, and pubic   symphysis. Partially imaged degenerative changes of the lumbar spine.  3.  Osteopenia.     CULTURES:  No results found for this visit on 05/28/24 (from the past week).  Consults:   IP CONSULT TO HOSPITALIST   Follow-up Appointments:    No future appointments.    Predictive Model Details        12.3% (Medium)  Factor Value   Calculated 06/01/2024 12:09 14% Braden score 13   Readmission Risk Score v2 Model 7% Number of active outpatient medication orders 2    7% Latest BUN in last 72 hrs 28 mg/dL    6% Diagnosis of fluid or electrolyte disorder present    6% Latest hemoglobin in last 72 hrs 14.5 g/dL      Electronically signed by: Dorise Donnice Hammers, MD 06/01/2024 12:42 PM Time spent on discharge: 32 minutes.

## 2024-06-09 NOTE — ED Provider Notes (Signed)
 [facility] Emergency Department Provider Note  This document was created using the aid of voice recognition Dragon dictation software.   Provider at bedside: 06/09/2024 12:51 AM  History obtained from the: Patient  History   Chief Complaint  Patient presents with   Fall     History provided by:  Patient Language interpreter used: No     Jacqueline Mathews is a 89 y.o. female who presents to the ED with complaints of fall.  Normal exam patient brought in from [facility] with apparent injury to the right wrist.  Patient states that she fell when getting out of bed 6 days ago though the facility is unsure when the injury occurred.  She presents with pain and swelling to the right hand/wrist.  She reportedly had x-rays of the entire right upper extremity done at an outpatient facility which showed question of fracture in the right hand.  She denies any numbness or weakness distally.  No other areas of pain reported.   ______________________ ROS: Pertinent positives and negatives per HPI.  Physical Exam   Vitals:   06/09/24 0100 06/09/24 0115 06/09/24 0200 06/09/24 0249  BP: (!) 135/114 140/74 130/75 133/77  BP Location:      Patient Position:      Pulse:  82 83 80  Resp:  18 18 18   Temp:      TempSrc:      SpO2:  100% 100% 100%  Weight:        Physical Exam Constitutional:      Appearance: Normal appearance. She is cachectic. She is ill-appearing (chronically). She is not toxic-appearing.  HENT:     Head: Normocephalic and atraumatic.  Cardiovascular:     Rate and Rhythm: Normal rate.  Pulmonary:     Effort: Pulmonary effort is normal.     Comments: On 3L [place] Genitourinary:    Comments: Indwelling Foley in place Musculoskeletal:     Comments: Tenderness to palpation of the distal aspect of the right wrist extending into the radial aspect of the right hand with positive snuffbox tenderness and swelling noted in the  hand, 2+ radial pulse, neurovascularly intact distally, no other tenderness to palpation throughout the proximal right forearm, elbow, upper arm or shoulder with passive and active range of motion of the shoulder and elbow intact.  No other tenderness to palpation throughout the left upper extremity or left lower extremity.  Decreased range of motion of the right hip with question of mild tenderness to palpation of the right hip but no other tenderness to palpation throughout the right lower extremity  Skin:    General: Skin is warm and dry.  Neurological:     Mental Status: She is alert.  Psychiatric:        Mood and Affect: Mood normal.     Results    ED Course     Medical Decision Making    DDX: strain, sprain, fracture, contusion, dislocation, hematoma, bursitis   Clinical Complexity  Patient's presentation is most consistent with acute presentation with potential threat to life or bodily function.  Patient's age increases the complexity of managing their  presentation with fall.    Provider time spent in patient care today, inclusive of but not limited to clinical reassessment, review of diagnostic studies, and discharge preparation, was greater than 30 minutes.   All Imaging and lab work personally viewed and interpreted by myself. My interpretations are as follow:  Patient brought  in with concern for injury to the right hand as above.  She presents with obvious swelling to the right hand with overlying tenderness to palpation of the hand and right wrist but no evidence of neurovascular injury.  X-rays of the wrist and forearm as well as right hip/pelvis obtained - notable only for question of a scaphoid fracture.  Given that she does have snuffbox tenderness with swelling in the hand, will place patient in thumb spica and discharge to follow-up with orthopedics as an outpatient  Medical Decision Making Problems Addressed: Closed nondisplaced fracture of scaphoid of right  wrist, unspecified portion of scaphoid, initial encounter: complicated acute illness or injury Fall, initial encounter: complicated acute illness or injury  Amount and/or Complexity of Data Reviewed Labs: ordered. Radiology: ordered.    ED Clinical Impression   1. Fall, initial encounter   2. Closed nondisplaced fracture of scaphoid of right wrist, unspecified portion of scaphoid, initial encounter    FOLLOW UP Jordan Miller Case, MD [address]  [address] [place] [address] [phone]   They will call you to schedule. Please call if you do not hear from them in 1-2 weeks   ED Disposition     ED Disposition  Discharge   Condition  Stable   Comment  --        _____________________________

## 2024-08-01 ENCOUNTER — Inpatient Hospital Stay (HOSPITAL_COMMUNITY): Admission: EM | Admit: 2024-08-01 | Source: Home / Self Care

## 2024-08-01 ENCOUNTER — Emergency Department (HOSPITAL_COMMUNITY)

## 2024-08-01 DIAGNOSIS — J9601 Acute respiratory failure with hypoxia: Secondary | ICD-10-CM

## 2024-08-01 DIAGNOSIS — A419 Sepsis, unspecified organism: Secondary | ICD-10-CM | POA: Diagnosis not present

## 2024-08-01 DIAGNOSIS — K3189 Other diseases of stomach and duodenum: Secondary | ICD-10-CM

## 2024-08-01 DIAGNOSIS — E8809 Other disorders of plasma-protein metabolism, not elsewhere classified: Secondary | ICD-10-CM

## 2024-08-01 DIAGNOSIS — I10 Essential (primary) hypertension: Secondary | ICD-10-CM | POA: Diagnosis not present

## 2024-08-01 DIAGNOSIS — N9489 Other specified conditions associated with female genital organs and menstrual cycle: Secondary | ICD-10-CM | POA: Diagnosis not present

## 2024-08-01 DIAGNOSIS — R7401 Elevation of levels of liver transaminase levels: Secondary | ICD-10-CM

## 2024-08-01 DIAGNOSIS — R0603 Acute respiratory distress: Principal | ICD-10-CM

## 2024-08-01 DIAGNOSIS — E43 Unspecified severe protein-calorie malnutrition: Secondary | ICD-10-CM | POA: Insufficient documentation

## 2024-08-01 DIAGNOSIS — R410 Disorientation, unspecified: Secondary | ICD-10-CM

## 2024-08-01 LAB — URINALYSIS, ROUTINE W REFLEX MICROSCOPIC
Bilirubin Urine: NEGATIVE
Glucose, UA: NEGATIVE mg/dL
Ketones, ur: NEGATIVE mg/dL
Nitrite: NEGATIVE
Protein, ur: 30 mg/dL — AB
Specific Gravity, Urine: 1.009 (ref 1.005–1.030)
WBC, UA: >50 WBC/hpf (ref 0–5)
pH: 5 (ref 5.0–8.0)

## 2024-08-01 LAB — CBC WITH DIFFERENTIAL/PLATELET
Abs Immature Granulocytes: 0.52 10*3/uL — ABNORMAL HIGH (ref 0.00–0.07)
Basophils Absolute: 0.1 10*3/uL (ref 0.0–0.1)
Basophils Relative: 1 %
Eosinophils Absolute: 0 10*3/uL (ref 0.0–0.5)
Eosinophils Relative: 0 %
HCT: 33.2 % — ABNORMAL LOW (ref 36.0–46.0)
Hemoglobin: 10.4 g/dL — ABNORMAL LOW (ref 12.0–15.0)
Immature Granulocytes: 3 %
Lymphocytes Relative: 5 %
Lymphs Abs: 1 10*3/uL (ref 0.7–4.0)
MCH: 32.2 pg (ref 26.0–34.0)
MCHC: 31.3 g/dL (ref 30.0–36.0)
MCV: 102.8 fL — ABNORMAL HIGH (ref 80.0–100.0)
Monocytes Absolute: 0.7 10*3/uL (ref 0.1–1.0)
Monocytes Relative: 4 %
Neutro Abs: 15.5 10*3/uL — ABNORMAL HIGH (ref 1.7–7.7)
Neutrophils Relative %: 87 %
Platelets: 353 10*3/uL (ref 150–400)
RBC: 3.23 MIL/uL — ABNORMAL LOW (ref 3.87–5.11)
RDW: 17.3 % — ABNORMAL HIGH (ref 11.5–15.5)
WBC: 17.9 10*3/uL — ABNORMAL HIGH (ref 4.0–10.5)
nRBC: 0 % (ref 0.0–0.2)

## 2024-08-01 LAB — I-STAT VENOUS BLOOD GAS, ED
Acid-base deficit: 1 mmol/L (ref 0.0–2.0)
Bicarbonate: 25.2 mmol/L (ref 20.0–28.0)
Calcium, Ion: 1.07 mmol/L — ABNORMAL LOW (ref 1.15–1.40)
HCT: 31 % — ABNORMAL LOW (ref 36.0–46.0)
Hemoglobin: 10.5 g/dL — ABNORMAL LOW (ref 12.0–15.0)
O2 Saturation: 81 %
Potassium: 4.6 mmol/L (ref 3.5–5.1)
Sodium: 135 mmol/L (ref 135–145)
TCO2: 27 mmol/L (ref 22–32)
pCO2, Ven: 47.6 mmHg (ref 44–60)
pH, Ven: 7.331 (ref 7.25–7.43)
pO2, Ven: 49 mmHg — ABNORMAL HIGH (ref 32–45)

## 2024-08-01 LAB — COMPREHENSIVE METABOLIC PANEL WITH GFR
ALT: 77 U/L — ABNORMAL HIGH (ref 0–44)
AST: 50 U/L — ABNORMAL HIGH (ref 15–41)
Albumin: 2.2 g/dL — ABNORMAL LOW (ref 3.5–5.0)
Alkaline Phosphatase: 418 U/L — ABNORMAL HIGH (ref 38–126)
Anion gap: 14 (ref 5–15)
BUN: 28 mg/dL — ABNORMAL HIGH (ref 8–23)
CO2: 23 mmol/L (ref 22–32)
Calcium: 8.6 mg/dL — ABNORMAL LOW (ref 8.9–10.3)
Chloride: 98 mmol/L (ref 98–111)
Creatinine, Ser: 0.89 mg/dL (ref 0.44–1.00)
GFR, Estimated: >60 mL/min
Glucose, Bld: 109 mg/dL — ABNORMAL HIGH (ref 70–99)
Potassium: 4.9 mmol/L (ref 3.5–5.1)
Sodium: 135 mmol/L (ref 135–145)
Total Bilirubin: 0.6 mg/dL (ref 0.0–1.2)
Total Protein: 5.7 g/dL — ABNORMAL LOW (ref 6.5–8.1)

## 2024-08-01 LAB — I-STAT ARTERIAL BLOOD GAS, ED
Acid-Base Excess: 2 mmol/L (ref 0.0–2.0)
Bicarbonate: 27.7 mmol/L (ref 20.0–28.0)
Calcium, Ion: 1.15 mmol/L (ref 1.15–1.40)
HCT: 31 % — ABNORMAL LOW (ref 36.0–46.0)
Hemoglobin: 10.5 g/dL — ABNORMAL LOW (ref 12.0–15.0)
O2 Saturation: 99 %
Potassium: 3.8 mmol/L (ref 3.5–5.1)
Sodium: 137 mmol/L (ref 135–145)
TCO2: 29 mmol/L (ref 22–32)
pCO2 arterial: 48 mmHg (ref 32–48)
pH, Arterial: 7.369 (ref 7.35–7.45)
pO2, Arterial: 159 mmHg — ABNORMAL HIGH (ref 83–108)

## 2024-08-01 LAB — I-STAT CHEM 8, ED
BUN: 29 mg/dL — ABNORMAL HIGH (ref 8–23)
Calcium, Ion: 1.07 mmol/L — ABNORMAL LOW (ref 1.15–1.40)
Chloride: 100 mmol/L (ref 98–111)
Creatinine, Ser: 1.1 mg/dL — ABNORMAL HIGH (ref 0.44–1.00)
Glucose, Bld: 108 mg/dL — ABNORMAL HIGH (ref 70–99)
HCT: 32 % — ABNORMAL LOW (ref 36.0–46.0)
Hemoglobin: 10.9 g/dL — ABNORMAL LOW (ref 12.0–15.0)
Potassium: 4.6 mmol/L (ref 3.5–5.1)
Sodium: 135 mmol/L (ref 135–145)
TCO2: 24 mmol/L (ref 22–32)

## 2024-08-01 LAB — PROTIME-INR
INR: 1 (ref 0.8–1.2)
Prothrombin Time: 14 s (ref 11.4–15.2)

## 2024-08-01 LAB — GLUCOSE, CAPILLARY
Glucose-Capillary: 157 mg/dL — ABNORMAL HIGH (ref 70–99)
Glucose-Capillary: 168 mg/dL — ABNORMAL HIGH (ref 70–99)

## 2024-08-01 LAB — LACTIC ACID, PLASMA: Lactic Acid, Venous: 3.7 mmol/L (ref 0.5–1.9)

## 2024-08-01 LAB — I-STAT CG4 LACTIC ACID, ED: Lactic Acid, Venous: 5 mmol/L (ref 0.5–1.9)

## 2024-08-01 LAB — MRSA NEXT GEN BY PCR, NASAL: MRSA by PCR Next Gen: NOT DETECTED

## 2024-08-01 MED ORDER — NOREPINEPHRINE 4 MG/250ML-% IV SOLN
0.0000 ug/min | INTRAVENOUS | Status: DC
  Administered 2024-08-01: 6 ug/min via INTRAVENOUS
  Administered 2024-08-01: 10 ug/min via INTRAVENOUS
  Administered 2024-08-02: 6 ug/min via INTRAVENOUS
  Filled 2024-08-01 (×2): qty 250

## 2024-08-01 MED ORDER — FENTANYL CITRATE (PF) 50 MCG/ML IJ SOSY: 25.0000 ug | PREFILLED_SYRINGE | Freq: Once | INTRAMUSCULAR | Status: DC

## 2024-08-01 MED ORDER — PANTOPRAZOLE SODIUM 40 MG IV SOLR
80.0000 mg | Freq: Once | INTRAVENOUS | Status: AC
  Administered 2024-08-01: 80 mg via INTRAVENOUS
  Filled 2024-08-01: qty 20

## 2024-08-01 MED ORDER — CHLORHEXIDINE GLUCONATE CLOTH 2 % EX PADS
6.0000 | MEDICATED_PAD | Freq: Every day | CUTANEOUS | Status: AC
  Administered 2024-08-01 – 2024-08-03 (×3): 6 via TOPICAL

## 2024-08-01 MED ORDER — VANCOMYCIN HCL IN DEXTROSE 1-5 GM/200ML-% IV SOLN
1000.0000 mg | Freq: Once | INTRAVENOUS | Status: AC
  Administered 2024-08-01: 1000 mg via INTRAVENOUS
  Filled 2024-08-01: qty 200

## 2024-08-01 MED ORDER — PANTOPRAZOLE SODIUM 40 MG IV SOLR
80.0000 mg | Freq: Two times a day (BID) | INTRAVENOUS | Status: DC
  Administered 2024-08-02: 80 mg via INTRAVENOUS
  Filled 2024-08-01: qty 20

## 2024-08-01 MED ORDER — IOHEXOL 350 MG/ML SOLN
75.0000 mL | Freq: Once | INTRAVENOUS | Status: AC | PRN
  Administered 2024-08-01: 75 mL via INTRAVENOUS

## 2024-08-01 MED ORDER — POLYETHYLENE GLYCOL 3350 17 G PO PACK: 17.0000 g | PACK | Freq: Every day | ORAL | Status: AC | PRN

## 2024-08-01 MED ORDER — PIPERACILLIN-TAZOBACTAM 3.375 G IVPB 30 MIN
3.3750 g | Freq: Once | INTRAVENOUS | Status: AC
  Administered 2024-08-01: 3.375 g via INTRAVENOUS
  Filled 2024-08-01: qty 50

## 2024-08-01 MED ORDER — SODIUM CHLORIDE 0.9% IV SOLUTION: Freq: Once | INTRAVENOUS | Status: AC

## 2024-08-01 MED ORDER — SODIUM CHLORIDE 0.9 % IV BOLUS
1000.0000 mL | Freq: Once | INTRAVENOUS | Status: AC
  Administered 2024-08-01: 1000 mL via INTRAVENOUS

## 2024-08-01 MED ORDER — POLYETHYLENE GLYCOL 3350 17 G PO PACK: 17.0000 g | PACK | Freq: Every day | ORAL | Status: AC

## 2024-08-01 MED ORDER — NOREPINEPHRINE 4 MG/250ML-% IV SOLN
INTRAVENOUS | Status: AC | PRN
  Administered 2024-08-01: 10 ug/min via INTRAVENOUS

## 2024-08-01 MED ORDER — FENTANYL 2500MCG IN NS 250ML (10MCG/ML) PREMIX INFUSION
0.0000 ug/h | INTRAVENOUS | Status: DC
  Administered 2024-08-01 – 2024-08-02 (×2): 50 ug/h via INTRAVENOUS
  Filled 2024-08-01 (×2): qty 250

## 2024-08-01 MED ORDER — SENNA 8.6 MG PO TABS: 1.0000 | ORAL_TABLET | Freq: Two times a day (BID) | ORAL | Status: AC | PRN

## 2024-08-01 MED ORDER — FENTANYL BOLUS VIA INFUSION
25.0000 ug | INTRAVENOUS | Status: DC | PRN
  Administered 2024-08-01 – 2024-08-02 (×3): 100 ug via INTRAVENOUS

## 2024-08-01 MED ORDER — ROCURONIUM BROMIDE 10 MG/ML (PF) SYRINGE
PREFILLED_SYRINGE | INTRAVENOUS | Status: AC | PRN
  Administered 2024-08-01: 50 mg via INTRAVENOUS

## 2024-08-01 MED ORDER — SODIUM CHLORIDE 0.9 % IV SOLN
250.0000 mL | INTRAVENOUS | Status: AC
  Administered 2024-08-01 – 2024-08-02 (×2): 250 mL via INTRAVENOUS

## 2024-08-01 MED ORDER — LACTATED RINGERS IV BOLUS
500.0000 mL | Freq: Once | INTRAVENOUS | Status: AC
  Administered 2024-08-01: 500 mL via INTRAVENOUS

## 2024-08-01 MED ORDER — SENNA 8.6 MG PO TABS
1.0000 | ORAL_TABLET | Freq: Two times a day (BID) | ORAL | Status: AC
  Filled 2024-08-01: qty 1

## 2024-08-01 MED ORDER — MIDAZOLAM HCL (PF) 2 MG/2ML IJ SOLN
1.0000 mg | INTRAMUSCULAR | Status: DC | PRN
  Administered 2024-08-01: 2 mg via INTRAVENOUS
  Administered 2024-08-02: 1 mg via INTRAVENOUS
  Filled 2024-08-01 (×2): qty 2

## 2024-08-01 MED ORDER — ETOMIDATE 2 MG/ML IV SOLN
INTRAVENOUS | Status: AC | PRN
  Administered 2024-08-01: 10 mg via INTRAVENOUS

## 2024-08-01 MED ORDER — PANTOPRAZOLE SODIUM 40 MG IV SOLR: 40.0000 mg | Freq: Two times a day (BID) | INTRAVENOUS | Status: DC

## 2024-08-01 NOTE — ED Triage Notes (Signed)
 T was BIB [facility] from [facility] with complaints from staff of her not being her normal  self when they went to check on her around 2pm along with coffee ground emesis. She appeared normal this morning when she was checked on. EMS arrived and her O2 was at 50% so they started bagging. Staff stated those were her sats for a while  Ems started a 20ga FRA for an EPI DRIP BP was around 108 RR started around 12 with bag assistance then went down to 2-4.   EDP at bedside. Patient was sedated and intubated.

## 2024-08-01 NOTE — Progress Notes (Signed)
 RT NOTE:  Pt transported to 3M07 without event. Report to [facility], RRT.

## 2024-08-01 NOTE — ED Notes (Signed)
 Unable to obtain blood cultures. IV wouldn't put back when placed. Lab could not get blood easily only able to obtain basic labs  Foley was placed 16 FR with low output even though bladder is palpable. Changed to a 16 fr and still low output. ICU NP placed 22FR coude with no output. Urology is being consulted.

## 2024-08-01 NOTE — Consult Note (Signed)
 "   Reason for Consult:  Hiatal hernia Referring Provider: Tamela, MD  HPI  Jacqueline Mathews is an 89 y.o. female with history of hypertension and glaucoma who presented to the ED earlier today from her SNF with altered mental status, hypoxia, coffee-ground emesis.  Per report from EMS patient had oxygen saturation in the 50s and was being bagged.  Patient became unresponsive and route to ED and was intubated emergently when she arrived to the ED.  Patient also was hypothermic and tachycardic and hypotensive, requiring pressors.  Workup notable for slightly elevated LFTs, and elevated lactate to 5.0, and elevated white count to 17.9.  Imaging revealed large hiatal hernia containing almost entirety of the stomach with mesenteric axial rotation and air-fluid level.  Concern for possible gastric outlet obstruction.  There is wall thickening and hyperenhancement of the esophagus consistent with esophagitis.  There is also a large cystic lesion in her abdomen that is concerning for gynecologic origin.  Concern for GI bleed given coffee-ground emesis.  Per report, patient is estranged from her family and therefore has no identified oceanographer.  10 point review of systems is negative except as listed above in HPI.  Objective  Past Medical History: No past medical history on file.  Past Surgical History: No past surgical history on file.  Family History:  Family History  Problem Relation Age of Onset   Colon cancer Mother     Social History:  reports that she has never smoked. She has never used smokeless tobacco. She reports that she does not drink alcohol and does not use drugs.  Allergies: Allergies[1]  Medications: I have reviewed the patient's current medications.  Labs: I have personally reviewed all labs for the past 24h  Imaging: I have personally reviewed and interpreted all imaging for the past 24h and agree with the radiologist's impression.  CT Angio Abd/Pel W  and/or Wo Contrast Result Date: 08/01/2024 EXAM: CTA ABDOMEN AND PELVIS WITHOUT AND WITH CONTRAST 08/01/2024 06:01:00 PM TECHNIQUE: CTA images of the abdomen and pelvis without and with intravenous contrast. 75 mL iohexol  (OMNIPAQUE ) 350 MG/ML injection. Three-dimensional MIP/volume rendered formations were performed. Automated exposure control, iterative reconstruction, and/or weight based adjustment of the mA/kV was utilized to reduce the radiation dose to as low as reasonably achievable. COMPARISON: 10/30/2019 CLINICAL HISTORY: hematemesis. AMS. Intubated. Distended abdomen. FINDINGS: VASCULATURE: Aortic atherosclerotic calcification. AORTA: Aortic atherosclerotic calcification. No acute finding. No abdominal aortic aneurysm. No dissection. CELIAC TRUNK: No acute finding. No occlusion or significant stenosis. SUPERIOR MESENTERIC ARTERY: No acute finding. No occlusion or significant stenosis. RENAL ARTERIES: No acute finding. No occlusion or significant stenosis. ILIAC ARTERIES: No acute finding. No occlusion or significant stenosis. LIVER: Innumerable hepatic cysts. GALLBLADDER AND BILE DUCTS: Distended gallbladder. No biliary ductal dilatation. SPLEEN: The spleen is unremarkable. PANCREAS: The pancreas is unremarkable. ADRENAL GLANDS: Bilateral adrenal glands demonstrate no acute abnormality. KIDNEYS, URETERS AND BLADDER: Absent left kidney. Large extrarenal pelvis in the right kidney, slightly increased in size since 2021. Foley catheter and gas in the bladder. Nonobstructing right renal calculi. No hydronephrosis. No perinephric or periureteral stranding. GI AND BOWEL: Large hiatal hernia containing most of the stomach in the chest. The stomach demonstrates mesenteroaxial rotation with possible gastric outlet obstruction. Enteric tube tip in the stomach. Moderate stool in the rectum. There is no bowel obstruction. No abnormal bowel wall thickening or distension. REPRODUCTIVE: A large cystic pelvic mass is  present, measuring 18.9 x 13.1 x 17.4 cm. This is new since  the most recent comparison of 10/30/2019. There is mass effect on the bladder which is displaced to the right as well as the small bowel and colon. The origin of the mass is unclear. It appears distinct from the uterus. Most likely source is an adnexal/ovarian cystic lesion. PERITONEUM AND RETROPERITONEUM: No ascites or free air. LUNG BASE: Findings in the chest reported separately. LYMPH NODES: No lymphadenopathy. BONES AND SOFT TISSUES: Scoliosis. No acute abnormality of the bones. No acute soft tissue abnormality. IMPRESSION: 1. Large complex cystic mass in the left lower abdomen, new since 10/30/19, measuring 18.9 cm. The origin is unclear but appears distinct from the uterus, with the most likely source being adnexal/ovarian concerning for low-grade cystic neoplasm. Gynecology consultation recommended. 2. Large hiatal hernia containing most of the stomach in the chest, with mesenteroaxial rotation and possible gastric outlet obstruction. Electronically signed by: Norman Gatlin MD 08/01/2024 06:37 PM EST RP Workstation: [id number]   CT Chest W Contrast Result Date: 08/01/2024 EXAM: CT CHEST WITH CONTRAST 08/01/2024 06:01:00 PM TECHNIQUE: CT of the chest was performed with the administration of 75 mL of iohexol  (OMNIPAQUE ) 350 MG/ML injection. Multiplanar reformatted images are provided for review. Automated exposure control, iterative reconstruction, and/or weight based adjustment of the mA/kV was utilized to reduce the radiation dose to as low as reasonably achievable. COMPARISON: Comparison with same day x-ray and CT chest 10/16/2024. CLINICAL HISTORY: Eval for pneumonia. Hematemesis, altered mental status. FINDINGS: MEDIASTINUM: Heart and pericardium: Coronary artery and aortic atherosclerotic calcification. Central airways: Endotracheal tube in the intrathoracic trachea. Layering debris in the trachea and left mainstem bronchus with occlusive  debris in the left lower lobe bronchi. Esophagus: Diffuse wall thickening and mucosal hyperenhancement of the esophagus compatible with esophagitis. Hiatal hernia: Large hiatal hernia containing most of the stomach in the chest. There is mesenteraxial rotation of the stomach. Air-fluid level in the stomach. Gastric outlet obstruction is not excluded. LYMPH NODES: No mediastinal, hilar or axillary lymphadenopathy. LUNGS AND PLEURA: Lungs: Emphysema. Scattered centrilobular micronodules and tree-in-bud opacities. Complete atelectasis of the left lower lobe with some low density edema compatible with pneumonia. Pleura: Moderate left pleural effusion. Small right pleural effusion.  No pneumothorax. SOFT TISSUES/BONES: No acute abnormality of the bones or soft tissues. UPPER ABDOMEN: Subdiaphragmatic enteric tube. Innumerable hepatic cysts. No other acute abnormality. IMPRESSION: 1. Complete collapse of the left lower lobe with postobstructive pneumonia. Moderate left pleural effusion. 2. Large hiatal hernia containing most of the stomach in the chest with mesenteraxial rotation and air-fluid level. Gastric outlet obstruction is not excluded. 3. Diffuse wall thickening and mucosal hyperenhancement of the esophagus compatible with esophagitis. Electronically signed by: Norman Gatlin MD 08/01/2024 06:25 PM EST RP Workstation: [id number]   CT Head Wo Contrast Result Date: 08/01/2024 EXAM: CT HEAD WITHOUT CONTRAST 08/01/2024 06:01:00 PM TECHNIQUE: CT of the head was performed without the administration of intravenous contrast. Automated exposure control, iterative reconstruction, and/or weight based adjustment of the mA/kV was utilized to reduce the radiation dose to as low as reasonably achievable. COMPARISON: 5 / 4 / 21 CLINICAL HISTORY: FINDINGS: BRAIN AND VENTRICLES: No acute hemorrhage. No evidence of acute infarct. No hydrocephalus. No extra-axial collection. No mass effect or midline shift. 5 mm calcified  extra-axial lesion along left para midline falx anteriorly. Most likely meningioma. Generalized cerebral volume loss. Scattered hypoattenuating foci in supratentorial white matter, nonspecific, likely chronic microangiopathic changes. Intracranial atherosclerosis. ORBITS: No acute abnormality. SINUSES: Mild scattered paranasal sinus mucosal thickening. Opacification of right mastoid air cells. SOFT  TISSUES AND SKULL: No acute soft tissue abnormality. No skull fracture. IMPRESSION: 1. No acute intracranial abnormality. Electronically signed by: Norman Gatlin MD 08/01/2024 06:10 PM EST RP Workstation: [id number]   DG Chest Portable 1 View Result Date: 08/01/2024 CLINICAL DATA:  Status post intubation EXAM: PORTABLE CHEST 1 VIEW COMPARISON:  Oct 30, 2019 FINDINGS: The heart size and mediastinal contours are within normal limits. Endotracheal tube is in grossly good position. Nasogastric tube tip is seen in proximal stomach. Right lung is clear. Minimal left basilar atelectasis or infiltrate is noted with possible small left pleural effusion. The visualized skeletal structures are unremarkable. IMPRESSION: Endotracheal and nasogastric tubes are in grossly good position. Minimal left basilar atelectasis or infiltrate is noted with possible small left pleural effusion. Electronically Signed   By: Lynwood Landy Raddle M.D.   On: 08/01/2024 16:00     Physical Exam Blood pressure 94/69, pulse (!) 102, temperature (!) 95.2 F (35.1 C), resp. rate 20, height 5' 4 (1.626 m), weight 44 kg, SpO2 95%. General: Intubated and mechanically ventilated HEENT: normocephalic, atraumatic Oropharynx: coffee ground appearing output in NGT CV: Sinus tachycardia, hypotensive Chest: equal chest rise bilaterally on full mechanical ventilatory support Abdomen: large palpable lesion in lower abdomen, nontender Extremities: moves all extremities Skin: warm, dry, no rashes Psych: normal memory, normal mood/affect  Neuro: No focal  neurologic deficits, A&Ox3    Assessment   RHODESIA STANGER is an 89 y.o. female who presented from SNF with AMS, hypoxia, and c/f GI bleed. Found to have large cystic mass on LLQ concerning for gynecologic origin as well as large hiatal hernia with GOO.  Plan  - Patient is very high risk for surgery. Please see NSQIP calculator below. 62.1% risk of death. I think that risks of surgery are too high to consider surgery at this time to be an acceptable option. - Agree with gynecology consultation in AM regarding cystic mass to help with prognostication for GoC discussions when family or advocate is identified. - Protonix  80 BID given GI bleed - FEN - strict NPO, IVF per CCM - Continue OGT  - DVT - SCDs, hold chemical ppx due to bleeding concerns - Dispo - ICU, remainder of care per CCM I discussed recommendations with Dr. Tamela with CCM.    I reviewed ED provider notes, Consultant CCM notes, hospitalist notes, last 24 h vitals and pain scores, last 48 h intake and output, last 24 h labs and trends, and last 24 h imaging results.  This care required high  level of medical decision making.  Orie Silversmith, MD General Surgery, Surgical Critical Care and Trauma        [1]  Allergies Allergen Reactions   Clarithromycin Hives   Penicillin G Benzathine Hives   Penicillins Hives   Sulfamethoxazole-Trimethoprim Hives   "

## 2024-08-01 NOTE — Progress Notes (Signed)
 eLink Physician-Brief Progress Note Patient Name: Jacqueline Mathews DOB: [birth year] MRN: [id number]   Date of Service  08/01/2024  HPI/Events of Note  Patient admitted with altered mental status, acute respiratory failure and reported coffee ground emesis, patient is also cachectic. She is intubated and mechanically ventilated.  eICU Interventions  New Patient Evaluation.        Aldwin Micalizzi U Michelena Culmer 08/01/2024, 8:36 PM

## 2024-08-01 NOTE — H&P (Signed)
 "  NAME:  Jacqueline Mathews, MRN:  [id number], DOB:  June 06, 1935, LOS: 0 ADMISSION DATE:  08/01/2024, CONSULTATION DATE:  08/01/2024 REFERRING MD: ED, CHIEF COMPLAINT: Septic shock  History of Present Illness:  Jacqueline Mathews is a 89 year old female with past medical history significant for HTN and glaucoma who presented to the ED via EMS from [facility] with reports of AMS, hypoxia , and coffee-ground emesis.  On EMS arrival patient was seen with oxygen saturation 50% per bag mask assisted ventilations were provided.  Later patient became unresponsive and route to ED prompting emergent endotracheal intubation on admission.  Additionally patient was seen hypothermic and tachycardic on admission.  Lab work significant for alkaline phosphatase 418 albumin 2.2, AST 50, ALT 77, total protein 5.7 lactic acid 5.0, WBC 17.9.  CT chest abdomen and pelvis pending at time of assessment.   Pertinent  Medical History  Glaucoma  Significant Hospital Events: Including procedures, antibiotic start and stop dates in addition to other pertinent events   08/01/2024 presented from [facility] with significant hypoxia, AMS, and coffee-ground emesis.  Intubated on ED arrival with shock state requiring vasopressor support  Interim History / Subjective:  As above  Objective    Blood pressure 97/68, pulse (!) 108, temperature (!) 95.2 F (35.1 C), temperature source Oral, resp. rate 18, height 5' 4 (1.626 m), SpO2 96%.    Vent Mode: PRVC FiO2 (%):  [60 %-100 %] 60 % Set Rate:  [18 bmp] 18 bmp Vt Set:  [470 mL] 470 mL PEEP:  [5 cmH20] 5 cmH20 Plateau Pressure:  [21 cmH20] 21 cmH20   Intake/Output Summary (Last 24 hours) at 08/01/2024 1741 Last data filed at 08/01/2024 1717 Gross per 24 hour  Intake 315 ml  Output --  Net 315 ml   There were no vitals filed for this visit.  Examination: General: Acute on chronically ill appearing cachetic elderly female lying in bed on mechanical ventilation, in  NAD HEENT: ETT, MM pink/moist, PERRL,  Neuro: Unresponsive on vent  CV: s1s2 regular rate and rhythm, no murmur, rubs, or gallops,  PULM:  Clear to auscultation bilaterally, no increased work of breathing, no added breath sounds GI: Distended abdomen with demised bowel sound Extremities: warm/dry, no edema  Skin: no rashes or lesions  Resolved problem list   Assessment and Plan  Sepsis -Patient presented with hypothermia, tachypnea, and hypoxia in the setting of positive lactic acid of 5.0 and leukocytosis.  -Possible gastric outlet obstruction per CT ABD P: Admit ICU Vent support as below Follow cultures including blood and urine Empiric Vanco and Zosyn  Continue pressors for MAP goal greater than 65 Trend lactic acid Monitor urine output  Acute Hypoxic Respiratory Failure  -Per EMS patient had oxygen saturation of 50 to 60% on room air requiring bag mask assist ventilation on their arrival.  Patient emergently intubated in ED P: Continue ventilator support with lung protective strategies  Wean PEEP and FiO2 for sats greater than 90%. Head of bed elevated 30 degrees. Plateau pressures less than 30 cm H20.  Follow intermittent chest x-ray and ABG.   SAT/SBT as tolerated, mentation preclude extubation  Ensure adequate pulmonary hygiene  Follow cultures  VAP bundle in place  PAD protocol Empiric antibiotics as above  Concern for GI bleed with possible gastric outlet obstruction on CT  -Reports of coffee-ground emesis at SNF with  P: Trend CBC Transfuse per protocol Hemoglobin goal greater than 7 Monitor for ongoing signs of bleeding PPI Consider  GI consult Consult general surgery to discuss possible surgical needs  Elevated LFTs P: Right upper quadrant ultrasound Trend LFTs Avoid hepatotoxins Follow CT abdomen  Concern for protein calorie malnutrition Hypoalbuminemia P: RD consult await for protein calorie malnutrition  Essential hypertension - Medication  reconciliation not yet completed but it appears patient utilizes Norvasc  at baseline: P: Hold home medications given shock state  History of glaucoma P: Once medication reconciliation complete resume home eyedrops  Large cystic mass left lower abd - Large complex cystic mass in the left lower abdomen, new since 10/30/19, measuring 18.9 cm. P: GYN consulted on admit with reccs given to discuss case in am with gyn oncology   Labs   CBC: Recent Labs  Lab 08/01/24 1612 08/01/24 1640 08/01/24 1642  WBC  --  17.9*  --   NEUTROABS  --  15.5*  --   HGB 10.9*  10.5* 10.4* 10.5*  HCT 32.0*  31.0* 33.2* 31.0*  MCV  --  102.8*  --   PLT  --  353  --     Basic Metabolic Panel: Recent Labs  Lab 08/01/24 1612 08/01/24 1642  NA 135  135 137  K 4.6  4.6 3.8  CL 100  --   GLUCOSE 108*  --   BUN 29*  --   CREATININE 1.10*  --    GFR: CrCl cannot be calculated (Unknown ideal weight.). Recent Labs  Lab 08/01/24 1612 08/01/24 1640  WBC  --  17.9*  LATICACIDVEN 5.0*  --     Liver Function Tests: No results for input(s): AST, ALT, ALKPHOS, BILITOT, PROT, ALBUMIN in the last 168 hours. No results for input(s): LIPASE, AMYLASE in the last 168 hours. No results for input(s): AMMONIA in the last 168 hours.  ABG    Component Value Date/Time   PHART 7.369 08/01/2024 1642   PCO2ART 48.0 08/01/2024 1642   PO2ART 159 (H) 08/01/2024 1642   HCO3 27.7 08/01/2024 1642   TCO2 29 08/01/2024 1642   ACIDBASEDEF 1.0 08/01/2024 1612   O2SAT 99 08/01/2024 1642     Coagulation Profile: Recent Labs  Lab 08/01/24 1640  INR 1.0    Cardiac Enzymes: No results for input(s): CKTOTAL, CKMB, CKMBINDEX, TROPONINI in the last 168 hours.  HbA1C: No results found for: HGBA1C  CBG: No results for input(s): GLUCAP in the last 168 hours.  Review of Systems:   Unable to assess   Past Medical History:  She,  has no past medical history on file.   Surgical  History:  No past surgical history on file.   Social History:   reports that she has never smoked. She has never used smokeless tobacco. She reports that she does not drink alcohol and does not use drugs.   Family History:  Her family history includes Colon cancer in her mother.   Allergies Allergies[1]   Home Medications  Prior to Admission medications  Medication Sig Start Date End Date Taking? Authorizing Provider  amLODipine  (NORVASC ) 5 MG tablet Take 1 tablet (5 mg total) by mouth daily. 11/03/19   Akula, Vijaya, MD  bimatoprost (LUMIGAN) 0.01 % SOLN Place 1 drop into both eyes at bedtime.     [provider]  docusate sodium  (COLACE) 100 MG capsule Take 1 capsule (100 mg total) by mouth 2 (two) times daily. 11/03/19   Akula, Vijaya, MD  pantoprazole  (PROTONIX ) 40 MG tablet Take 1 tablet (40 mg total) by mouth daily at 6 (six) AM. 11/03/19  Akula, Vijaya, MD  polyethylene glycol (MIRALAX  / GLYCOLAX ) 17 g packet Take 17 g by mouth daily as needed for mild constipation. 11/03/19   Akula, Vijaya, MD  timolol  (TIMOPTIC ) 0.25 % ophthalmic solution Place 1 drop into both eyes in the morning and at bedtime. 10/19/19   [provider]     Critical care time:   CRITICAL CARE Performed by: Alyia Lacerte D. Harris   Total critical care time: 45 minutes  Critical care time was exclusive of separately billable procedures and treating other patients.  Critical care was necessary to treat or prevent imminent or life-threatening deterioration.  Critical care was time spent personally by me on the following activities: development of treatment plan with patient and/or surrogate as well as nursing, discussions with consultants, evaluation of patient's response to treatment, examination of patient, obtaining history from patient or surrogate, ordering and performing treatments and interventions, ordering and review of laboratory studies, ordering and review of radiographic studies, pulse  oximetry and re-evaluation of patient's condition.  Ewin Rehberg D. Harris, NP-C [facility] Personal contact information can be found on Amion  If no contact or response made please call [phone] 08/01/2024, 6:03 PM             [1]  Allergies Allergen Reactions   Clarithromycin Hives   Penicillin G Benzathine Hives   Sulfamethoxazole-Trimethoprim Hives   "

## 2024-08-01 NOTE — ED Provider Notes (Signed)
 " [place] EMERGENCY DEPARTMENT AT [facility] Provider Note   CSN: [id number] Arrival date & time: 08/01/24  1516     Patient presents with: No chief complaint on file.   Jacqueline Mathews is a 89 y.o. female.   HPI   \  Patient presents from nursing home.  Patient was found to be unresponsive this morning.  Vomited up coffee-ground emesis.  Arrived in patient was satting in the 74s to 1s.  Somewhat responsive and breathing on her own.  Patient subsequently became unresponsive in route to the hospital.  Had to be bagged on the way to the hospital.  Maps in the 40s on way to the hospital.  Was started on fluid.  EMS reached out to family.  Estranged from family.  Reach out to brother.  Patient brother does not really talk.  They have a MOLST form that says full code.   Previous medical history reviewed : Last admitted in December 2025.  Outside hospital.  AKI.  Physical deconditioning.  Esophageal dysphagia.  Large hiatal hernia.  Dehydration.  Cystitis.  Hyponatremia.  Hypokalemia.  Acute encephalopathy.   Prior to Admission medications  Medication Sig Start Date End Date Taking? Authorizing Provider  amLODipine  (NORVASC ) 5 MG tablet Take 1 tablet (5 mg total) by mouth daily. 11/03/19   Akula, Vijaya, MD  bimatoprost (LUMIGAN) 0.01 % SOLN Place 1 drop into both eyes at bedtime.     [provider]  docusate sodium  (COLACE) 100 MG capsule Take 1 capsule (100 mg total) by mouth 2 (two) times daily. 11/03/19   Akula, Vijaya, MD  pantoprazole  (PROTONIX ) 40 MG tablet Take 1 tablet (40 mg total) by mouth daily at 6 (six) AM. 11/03/19   Akula, Vijaya, MD  polyethylene glycol (MIRALAX  / GLYCOLAX ) 17 g packet Take 17 g by mouth daily as needed for mild constipation. 11/03/19   Akula, Vijaya, MD  timolol  (TIMOPTIC ) 0.25 % ophthalmic solution Place 1 drop into both eyes in the morning and at bedtime. 10/19/19   [provider]    Allergies: Clarithromycin, Penicillin g  benzathine, and Sulfamethoxazole-trimethoprim    ROS: Not able to obtain   Updated Vital Signs BP 97/68   Pulse (!) 108   Temp (!) 95.2 F (35.1 C) (Oral)   Resp 18   Ht 5' 4 (1.626 m)   SpO2 96%   BMI 17.22 kg/m   Physical Exam Constitutional:      Appearance: She is ill-appearing and toxic-appearing.  HENT:     Head:     Comments: Pupils equal and reactive. + corneal reflex  Cardiovascular:     Rate and Rhythm: Tachycardia present.  Pulmonary:     Breath sounds: Rhonchi and rales present.  Abdominal:     General: There is distension.  Neurological:     Comments: GCS of 3:     (all labs ordered are listed, but only abnormal results are displayed) Labs Reviewed  CBC WITH DIFFERENTIAL/PLATELET - Abnormal; Notable for the following components:      Result Value   WBC 17.9 (*)    RBC 3.23 (*)    Hemoglobin 10.4 (*)    HCT 33.2 (*)    MCV 102.8 (*)    RDW 17.3 (*)    Neutro Abs 15.5 (*)    Abs Immature Granulocytes 0.52 (*)    All other components within normal limits  COMPREHENSIVE METABOLIC PANEL WITH GFR - Abnormal; Notable for the following components:  Glucose, Bld 109 (*)    BUN 28 (*)    Calcium 8.6 (*)    Total Protein 5.7 (*)    Albumin 2.2 (*)    AST 50 (*)    ALT 77 (*)    Alkaline Phosphatase 418 (*)    All other components within normal limits  I-STAT CG4 LACTIC ACID, ED - Abnormal; Notable for the following components:   Lactic Acid, Venous 5.0 (*)    All other components within normal limits  I-STAT VENOUS BLOOD GAS, ED - Abnormal; Notable for the following components:   pO2, Ven 49 (*)    Calcium, Ion 1.07 (*)    HCT 31.0 (*)    Hemoglobin 10.5 (*)    All other components within normal limits  I-STAT CHEM 8, ED - Abnormal; Notable for the following components:   BUN 29 (*)    Creatinine, Ser 1.10 (*)    Glucose, Bld 108 (*)    Calcium, Ion 1.07 (*)    Hemoglobin 10.9 (*)    HCT 32.0 (*)    All other components within normal limits   I-STAT ARTERIAL BLOOD GAS, ED - Abnormal; Notable for the following components:   pO2, Arterial 159 (*)    HCT 31.0 (*)    Hemoglobin 10.5 (*)    All other components within normal limits  CULTURE, BLOOD (ROUTINE X 2)  CULTURE, BLOOD (ROUTINE X 2)  MRSA NEXT GEN BY PCR, NASAL  PROTIME-INR  URINALYSIS, ROUTINE W REFLEX MICROSCOPIC  BLOOD GAS, ARTERIAL  CBC  BASIC METABOLIC PANEL WITH GFR  MAGNESIUM  PHOSPHORUS  CBG MONITORING, ED  TYPE AND SCREEN  PREPARE RBC (CROSSMATCH)    EKG: None  Radiology: DG Chest Portable 1 View Result Date: 08/01/2024 CLINICAL DATA:  Status post intubation EXAM: PORTABLE CHEST 1 VIEW COMPARISON:  Oct 30, 2019 FINDINGS: The heart size and mediastinal contours are within normal limits. Endotracheal tube is in grossly good position. Nasogastric tube tip is seen in proximal stomach. Right lung is clear. Minimal left basilar atelectasis or infiltrate is noted with possible small left pleural effusion. The visualized skeletal structures are unremarkable. IMPRESSION: Endotracheal and nasogastric tubes are in grossly good position. Minimal left basilar atelectasis or infiltrate is noted with possible small left pleural effusion. Electronically Signed   By: Lynwood Landy Raddle M.D.   On: 08/01/2024 16:00     Procedure Name: Intubation Date/Time: 08/01/2024 6:09 PM  Performed by: Simon Lavonia SAILOR, MDPre-anesthesia Checklist: Emergency Drugs available, Patient identified, Patient being monitored and Suction available Oxygen Delivery Method: Ambu bag Preoxygenation: Pre-oxygenation with 100% oxygen Induction Type: Rapid sequence Ventilation: Mask ventilation without difficulty Laryngoscope Size: Glidescope Grade View: Grade I Endobronchial tube: Double lumen EBT Tube size: 7.0 mm Number of attempts: 1 Placement Confirmation: ETT inserted through vocal cords under direct vision Secured at: 21 cm Tube secured with: ETT holder Dental Injury: Teeth and Oropharynx as per  pre-operative assessment        Medications Ordered in the ED  etomidate  (AMIDATE ) injection (10 mg Intravenous Given 08/01/24 1514)  rocuronium  (ZEMURON ) injection (50 mg Intravenous Given 08/01/24 1514)  norepinephrine  (LEVOPHED ) 4mg  in (0.016 mg/mL) premix infusion (10 mcg/min Intravenous New Bag/Given 08/01/24 1515)  0.9 %  sodium chloride  infusion (250 mLs Intravenous New Bag/Given 08/01/24 1617)  norepinephrine  (LEVOPHED ) 4mg  in (0.016 mg/mL) premix infusion (10 mcg/min Intravenous New Bag/Given 08/01/24 1510)  senna (SENOKOT) tablet 8.6 mg (has no administration in time range)  polyethylene  glycol (MIRALAX  / GLYCOLAX ) packet 17 g (has no administration in time range)  fentaNYL  (SUBLIMAZE ) injection 25-50 mcg ( Intravenous Not Given 08/01/24 1615)  fentaNYL  in NS (74mcg/ml) infusion-PREMIX (50 mcg/hr Intravenous New Bag/Given 08/01/24 1527)  fentaNYL  (SUBLIMAZE ) bolus via infusion 25-100 mcg (100 mcg Intravenous Bolus from Bag 08/01/24 1528)  midazolam  PF (VERSED ) injection 1-2 mg (2 mg Intravenous Given 08/01/24 1529)  sodium chloride  0.9 % bolus 1,000 mL (has no administration in time range)  Chlorhexidine  Gluconate Cloth 2 % PADS 6 each (has no administration in time range)  polyethylene glycol (MIRALAX  / GLYCOLAX ) packet 17 g (has no administration in time range)  senna (SENOKOT) tablet 8.6 mg (has no administration in time range)  pantoprazole  (PROTONIX ) injection 80 mg (80 mg Intravenous Given 08/01/24 1614)  sodium chloride  0.9 % bolus 1,000 mL (1,000 mLs Intravenous New Bag/Given 08/01/24 1530)  piperacillin -tazobactam (ZOSYN ) IVPB 3.375 g (0 g Intravenous Stopped 08/01/24 1648)  vancomycin  (VANCOCIN ) IVPB 1000 mg/200 mL premix (1,000 mg Intravenous New Bag/Given 08/01/24 1651)  0.9 %  sodium chloride  infusion (Manually program via Guardrails IV Fluids) ( Intravenous New Bag/Given 08/01/24 1727)  iohexol  (OMNIPAQUE ) 350 MG/ML injection 75 mL (75 mLs Intravenous Contrast Given  08/01/24 1755)                                    Medical Decision Making Amount and/or Complexity of Data Reviewed Labs: ordered. Radiology: ordered.  Risk OTC drugs. Prescription drug management. Decision regarding hospitalization.     HPI:   Patient presents from nursing home.  Patient was found to be unresponsive this morning.  Vomited up coffee-ground emesis.  Arrived in patient was satting in the 38s to 47s.  Somewhat responsive and breathing on her own.  Patient subsequently became unresponsive in route to the hospital.  Had to be bagged on the way to the hospital.  Maps in the 40s on way to the hospital.  Was started on fluid.  EMS reached out to family.  Estranged from family.  Reach out to brother.  Patient brother does not really talk.  They have a MOLST form that says full code.   Previous medical history reviewed : Last admitted in December 2025.  Outside hospital.  AKI.  Physical deconditioning.  Esophageal dysphagia.  Large hiatal hernia.  Dehydration.  Cystitis.  Hyponatremia.  Hypokalemia.  Acute encephalopathy.   MDM:   Upon examination, patient acutely ill appearing.  On epinephrine and being bagged.  1 point IV access.  Exam showed strong femoral pulse.  MAP of approximately 70 on epinephrine.  Not moving any extremities.  Not responding to pain.  Positive corneal reflex.  GCS of 3.  Needed to secure patient's airway.  Started patient on liter of fluid peripheral nor epi and continue to bag her until we get O2 saturation above 92%.  Once her maps were stable above 65 with fluid as well as Nor epi, elected to go ahead and intubate her to secure airway.  Noticed what look like some coffee-ground emesis.  Used etomidate  as well as rocuronium .  Needed some airway suctioning but otherwise was able to pass the ET tube on first attempt.  Concern for GI bleed.  Start patient on high-dose PPI.  Obtain laboratory workup.  Starting vancomycin  and Zosyn  due to concern for  possible sepsis in the setting of possible aspiration pneumonia versus other intra-abdominal infection.  Labs ordered.  Pending CT head as well as CT chest as well as CT abdomen.  EKG reviewed by me.  Sinus tachycardia.  No STEMI  Reevaluation:   Maps are stable on 10 Nor epi.  Received 2 L of fluid as well as continuous maintenance IV fluids.  Did go ahead and order 1 unit packed red blood cells as well due to coffee-ground emesis and pain like this could be a GI bleed.  Did contact gastroenterology who recommended PPI twice daily and supportive care.  They will see in the morning.  Globin came back to presently stable.  Does have a leukocytosis.  Left shift.  Labs show no hyperkalemia.  Mild elevation of liver enzymes.  Alk phos elevated.  Lactic acid 5.  Patient had already been started on broad-spectrum antibiotics.  ABG appropriate.  X-ray shows appropriate placement of ET tube.   Patient to be admitted to ICU.  Pending CT imaging.   Interventions: 2 L NS, Vanc, zosyn , 1 u PRBC   EKG Interpreted by Me: sinus tachy   Cardiac Tele Interpreted by Me: sinus tach    I have independently interpreted the CXR   images and agree with the radiologist finding   Social Determinant of Health: No immediate family    Disposition and Follow Up: admit  CRITICAL CARE Performed by: Lavonia LOISE Pat   Total critical care time: 55 minutes  Critical care time was exclusive of separately billable procedures and treating other patients.  Critical care was necessary to treat or prevent imminent or life-threatening deterioration.  Critical care was time spent personally by me on the following activities: development of treatment plan with patient and/or surrogate as well as nursing, discussions with consultants, evaluation of patient's response to treatment, examination of patient, obtaining history from patient or surrogate, ordering and performing treatments and interventions, ordering and  review of laboratory studies, ordering and review of radiographic studies, pulse oximetry and re-evaluation of patient's condition.       Final diagnoses:  Respiratory distress  Delirium  Sepsis with acute hypoxic respiratory failure and septic shock, due to unspecified organism [facility])    ED Discharge Orders     None          Pat Lavonia LOISE, MD 08/01/24 1811  "

## 2024-08-01 NOTE — Progress Notes (Addendum)
 ED Pharmacy Antibiotic Sign Off An antibiotic consult was received from an ED provider for cefepime  and vancomycin  per pharmacy dosing for sepsis. A chart review was completed to assess appropriateness.   The following one time order(s) were placed:  Zosyn  3.375g Vancomycin  1g  Further antibiotic and/or antibiotic pharmacy consults should be ordered by the admitting provider if indicated.   Thank you for allowing pharmacy to be a part of this patient's care.   Leonor GORMAN Bash, [facility]  Clinical Pharmacist 08/01/24 3:33 PM

## 2024-08-01 NOTE — Progress Notes (Signed)
 eLink Physician-Brief Progress Note Patient Name: KAIZLEY AJA DOB: 05-Feb-1935 MRN: [id number]   Date of Service  08/01/2024  HPI/Events of Note  Fluid resuscitation short of 30 ml / kg, lactic acid down from 5.0 to 3.7.  eICU Interventions  Will order an additional 500 ml of LR fluid bolus to optimize her volume resuscitation.        Nafeesah Lapaglia U Gabriel Conry 08/01/2024, 11:28 PM

## 2024-08-01 NOTE — Progress Notes (Signed)
 Pt transported to CT and back to ED room 18 via vent without complications.

## 2024-08-01 NOTE — Progress Notes (Signed)
 Coude cath placed in ER.  No order placed.  eLink team contacted for order to continue urinary catheter.

## 2024-08-02 ENCOUNTER — Encounter (HOSPITAL_COMMUNITY): Admission: EM | Payer: Self-pay | Source: Home / Self Care

## 2024-08-02 ENCOUNTER — Inpatient Hospital Stay (HOSPITAL_COMMUNITY)

## 2024-08-02 DIAGNOSIS — E43 Unspecified severe protein-calorie malnutrition: Secondary | ICD-10-CM | POA: Insufficient documentation

## 2024-08-02 DIAGNOSIS — K3189 Other diseases of stomach and duodenum: Secondary | ICD-10-CM

## 2024-08-02 LAB — CBC
HCT: 38.4 % (ref 36.0–46.0)
Hemoglobin: 12.9 g/dL (ref 12.0–15.0)
MCH: 31.1 pg (ref 26.0–34.0)
MCHC: 33.6 g/dL (ref 30.0–36.0)
MCV: 92.5 fL (ref 80.0–100.0)
Platelets: 366 10*3/uL (ref 150–400)
RBC: 4.15 MIL/uL (ref 3.87–5.11)
RDW: 19 % — ABNORMAL HIGH (ref 11.5–15.5)
WBC: 16 10*3/uL — ABNORMAL HIGH (ref 4.0–10.5)
nRBC: 0 % (ref 0.0–0.2)

## 2024-08-02 LAB — GLUCOSE, CAPILLARY
Glucose-Capillary: 129 mg/dL — ABNORMAL HIGH (ref 70–99)
Glucose-Capillary: 134 mg/dL — ABNORMAL HIGH (ref 70–99)
Glucose-Capillary: 100 mg/dL — ABNORMAL HIGH (ref 70–99)
Glucose-Capillary: 106 mg/dL — ABNORMAL HIGH (ref 70–99)
Glucose-Capillary: 94 mg/dL (ref 70–99)
Glucose-Capillary: 81 mg/dL (ref 70–99)

## 2024-08-02 LAB — TYPE AND SCREEN
ABO/RH(D): O POS
Antibody Screen: NEGATIVE
Unit division: 0

## 2024-08-02 LAB — BPAM RBC
Blood Product Expiration Date: 202602252359
ISSUE DATE / TIME: 202602041654
Unit Type and Rh: 5100

## 2024-08-02 LAB — BASIC METABOLIC PANEL WITH GFR
Anion gap: 10 (ref 5–15)
BUN: 28 mg/dL — ABNORMAL HIGH (ref 8–23)
CO2: 23 mmol/L (ref 22–32)
Calcium: 8.1 mg/dL — ABNORMAL LOW (ref 8.9–10.3)
Chloride: 101 mmol/L (ref 98–111)
Creatinine, Ser: 0.86 mg/dL (ref 0.44–1.00)
GFR, Estimated: >60 mL/min
Glucose, Bld: 139 mg/dL — ABNORMAL HIGH (ref 70–99)
Potassium: 4.3 mmol/L (ref 3.5–5.1)
Sodium: 134 mmol/L — ABNORMAL LOW (ref 135–145)

## 2024-08-02 LAB — MAGNESIUM: Magnesium: 2.1 mg/dL (ref 1.7–2.4)

## 2024-08-02 LAB — PREPARE RBC (CROSSMATCH)

## 2024-08-02 LAB — LACTIC ACID, PLASMA
Lactic Acid, Venous: 2.8 mmol/L (ref 0.5–1.9)
Lactic Acid, Venous: 2.4 mmol/L (ref 0.5–1.9)

## 2024-08-02 LAB — PHOSPHORUS: Phosphorus: 3.4 mg/dL (ref 2.5–4.6)

## 2024-08-02 MED ORDER — VANCOMYCIN HCL 500 MG/100ML IV SOLN: 500.0000 mg | INTRAVENOUS | Status: DC

## 2024-08-02 MED ORDER — LACTATED RINGERS IV SOLN: INTRAVENOUS | Status: DC

## 2024-08-02 MED ORDER — PHENYLEPHRINE HCL-NACL 20-0.9 MG/250ML-% IV SOLN
25.0000 ug/min | INTRAVENOUS | Status: AC
  Administered 2024-08-02: 30 ug/min via INTRAVENOUS
  Administered 2024-08-03: 60 ug/min via INTRAVENOUS
  Administered 2024-08-03 (×2): 30 ug/min via INTRAVENOUS
  Filled 2024-08-02 (×4): qty 250

## 2024-08-02 MED ORDER — PANTOPRAZOLE SODIUM 40 MG IV SOLR
40.0000 mg | Freq: Two times a day (BID) | INTRAVENOUS | Status: AC
  Administered 2024-08-02 – 2024-08-03 (×3): 40 mg via INTRAVENOUS
  Filled 2024-08-02 (×3): qty 10

## 2024-08-02 MED ORDER — SODIUM CHLORIDE 0.9 % IV SOLN: 250.0000 mL | INTRAVENOUS | Status: AC

## 2024-08-02 MED ORDER — SODIUM CHLORIDE 0.9 % IV SOLN: INTRAVENOUS | Status: AC

## 2024-08-02 MED ORDER — ORAL CARE MOUTH RINSE
15.0000 mL | OROMUCOSAL | Status: DC
  Administered 2024-08-02 – 2024-08-03 (×21): 15 mL via OROMUCOSAL

## 2024-08-02 MED ORDER — METOPROLOL TARTRATE 5 MG/5ML IV SOLN: 2.5000 mg | INTRAVENOUS | Status: AC | PRN

## 2024-08-02 MED ORDER — ORAL CARE MOUTH RINSE: 15.0000 mL | OROMUCOSAL | Status: DC | PRN

## 2024-08-02 MED ORDER — SODIUM CHLORIDE 0.9 % IV SOLN
2.0000 g | Freq: Two times a day (BID) | INTRAVENOUS | Status: AC
  Administered 2024-08-02 – 2024-08-03 (×4): 2 g via INTRAVENOUS
  Filled 2024-08-02 (×4): qty 12.5

## 2024-08-02 MED ORDER — LIDOCAINE HCL (PF) 1 % IJ SOLN
INTRAMUSCULAR | Status: AC
  Administered 2024-08-02: 5 mL
  Filled 2024-08-02: qty 10

## 2024-08-02 NOTE — Consult Note (Signed)
 Patient is evidently estranged from her listed family members and they are not available to give consent  I talked with the patient in the presence of her nurse regarding management of her large ovarian cyst which includes percutaneous drainage and drain placement to decompress her abdomen which we are hopeful will improve her septic clinical status. She nodded that she understood At some point in the future removal may be possible but for now this is a temporizing measure. Cytology to be done as well  Jacqueline VEAR Inch, MD 08/02/2024 .bridgette

## 2024-08-02 NOTE — Procedures (Signed)
 Vascular and Interventional Radiology Procedure Note  Patient: Jacqueline Mathews DOB: 1934/11/19 Medical Record Number: [id number] Note Date/Time: 08/02/24 3:20 PM   Performing Physician: Thom Hall, MD Assistant(s): None  Diagnosis: L adnexal cystic lesion   Procedure: DRAINAGE CATHETER PLACEMENT into a LEFT ADNEXAL CYSTIC MASS  Anesthesia: Local Anesthetic Complications: None Estimated Blood Loss: Minimal Specimens: Sent for Gram Stain, Aerobe Culture, Anerobe Culture, and Cytology  Findings:  Successful Ultrasound-guided placement of 10 F catheter into a L adnexal mass. 400 mL of tan / cloudy fluid was aspirated.   See detailed procedure note with images in PACS. The patient tolerated the procedure well without incident or complication and was returned to Recovery in critical condition.    Thom Hall, MD [facility] Interventional Radiology [facility]   Pager. [phone] Clinic. [phone]

## 2024-08-02 NOTE — Progress Notes (Signed)
 Pharmacy Antibiotic Note  Jacqueline Mathews is a 89 y.o. female admitted on 08/01/2024 with sepsis.  Pharmacy has been consulted for Vancomycin  dosing. WBC is elevated, CrCl ~30-35 mL/min.   Plan: Vancomycin  500 mg IV q24h >>>Estimated AUC: 422 Cefepime  per MD Trend WBC, temp, renal function  F/U infectious work-up Drug levels as indicated   Height: 5' 4 (162.6 cm) Weight: 44 kg (97 lb) IBW/kg (Calculated) : 54.7  Temp (24hrs), Avg:96.7 F (35.9 C), Min:94.6 F (34.8 C), Max:98.4 F (36.9 C)  Recent Labs  Lab 08/01/24 1612 08/01/24 1640 08/01/24 2104 08/02/24 0319  WBC  --  17.9*  --  16.0*  CREATININE 1.10* 0.89  --  0.86  LATICACIDVEN 5.0*  --  3.7*  --     Estimated Creatinine Clearance: 32 mL/min (by C-G formula based on SCr of 0.86 mg/dL).    Allergies[1]  Lynwood Mckusick, PharmD, BCPS Clinical Pharmacist Phone: [phone]      [1]  Allergies Allergen Reactions   Clarithromycin Hives   Penicillin G Benzathine Hives   Penicillins Hives   Sulfamethoxazole-Trimethoprim Hives

## 2024-08-02 NOTE — Progress Notes (Signed)
" °   08/02/24 0811  Adult Ventilator Settings  Vent Mode PSV;CPAP  FiO2 (%) 40 %  Pressure Support 8 cmH20  PEEP 5 cmH20  Daily Weaning Assessment  Daily Assessment of Readiness to Wean Wean protocol criteria met (SBT performed)  SBT Method CPAP 5 cm H20 and PS 5 cm H20 (PS 8)  Weaning Start Time 0811   Pt was placed on PS/CPAP mode on ventilator and is tolerating well at this time.  "

## 2024-08-02 NOTE — Progress Notes (Signed)
 "  General Surgery Follow Up Note  Subjective:    Overnight Issues:   Objective:  Vital signs for last 24 hours: Temp:  [93.2 F (34 C)-98.6 F (37 C)] 97.3 F (36.3 C) (02/05 1931) Pulse Rate:  [106-147] 111 (02/05 2015) Resp:  [11-21] 18 (02/05 2015) BP: (68-127)/(48-83) 89/67 (02/05 2015) SpO2:  [87 %-100 %] 96 % (02/05 2015) FiO2 (%):  [40 %-60 %] 40 % (02/05 1922) Weight:  [44 kg] 44 kg (02/05 0500)  Hemodynamic parameters for last 24 hours:    Intake/Output from previous day: 02/04 0701 - 02/05 0700 In: 3033.9 [I.V.:827.5; Blood:741; IV Piggyback:1465.4] Out: 408 [Urine:388; Emesis/NG output:20]  Intake/Output this shift: Total I/O In: 84.2 [I.V.:84.2] Out: 225 [Drains:225]  Vent settings for last 24 hours: Vent Mode: PRVC FiO2 (%):  [40 %-60 %] 40 % Set Rate:  [20 bmp] 20 bmp Vt Set:  [440 mL] 440 mL PEEP:  [5 cmH20] 5 cmH20 Pressure Support:  [8 cmH20] 8 cmH20 Plateau Pressure:  [12 cmH20-17 cmH20] 17 cmH20  Physical Exam:  Gen: comfortable, no distress Neuro: sedated on exam HEENT: PERRL Neck: supple CV: RRR, on levo at 3 Pulm: unlabored breathing on mechanical ventilation Abd: soft, NT , JP purulent GU: urine clear and yellow, +Foley Extr: wwp, no edema  Results for orders placed or performed during the hospital encounter of 08/01/24 (from the past 24 hours)  Culture, blood (Routine X 2) w Reflex to ID Panel     Status: None (Preliminary result)   Collection Time: 08/01/24  9:57 PM   Specimen: BLOOD  Result Value Ref Range   Specimen Description BLOOD SITE NOT SPECIFIED    Special Requests      BOTTLES DRAWN AEROBIC AND ANAEROBIC Blood Culture results may not be optimal due to an inadequate volume of blood received in culture bottles   Culture      NO GROWTH < 12 HOURS Performed at [facility], [address] [place], [place] [address]    Report Status PENDING   Prepare RBC (crossmatch)     Status: None   Collection Time: 08/01/24   9:57 PM  Result Value Ref Range   Order Confirmation      ORDER PROCESSED BY BLOOD BANK Performed at [facility], [address] [place], [place] [address]   Glucose, capillary     Status: Abnormal   Collection Time: 08/01/24 11:25 PM  Result Value Ref Range   Glucose-Capillary 168 (H) 70 - 99 mg/dL  CBC     Status: Abnormal   Collection Time: 08/02/24  3:19 AM  Result Value Ref Range   WBC 16.0 (H) 4.0 - 10.5 K/uL   RBC 4.15 3.87 - 5.11 MIL/uL   Hemoglobin 12.9 12.0 - 15.0 g/dL   HCT 61.5 63.9 - 53.9 %   MCV 92.5 80.0 - 100.0 fL   MCH 31.1 26.0 - 34.0 pg   MCHC 33.6 30.0 - 36.0 g/dL   RDW 80.9 (H) 88.4 - 84.4 %   Platelets 366 150 - 400 K/uL   nRBC 0.0 0.0 - 0.2 %  Basic metabolic panel     Status: Abnormal   Collection Time: 08/02/24  3:19 AM  Result Value Ref Range   Sodium 134 (L) 135 - 145 mmol/L   Potassium 4.3 3.5 - 5.1 mmol/L   Chloride 101 98 - 111 mmol/L   CO2 23 22 - 32 mmol/L   Glucose, Bld 139 (H) 70 -  99 mg/dL   BUN 28 (H) 8 - 23 mg/dL   Creatinine, Ser 9.13 0.44 - 1.00 mg/dL   Calcium 8.1 (L) 8.9 - 10.3 mg/dL   GFR, Estimated >39 >39 mL/min   Anion gap 10 5 - 15  Magnesium     Status: None   Collection Time: 08/02/24  3:19 AM  Result Value Ref Range   Magnesium 2.1 1.7 - 2.4 mg/dL  Phosphorus     Status: None   Collection Time: 08/02/24  3:19 AM  Result Value Ref Range   Phosphorus 3.4 2.5 - 4.6 mg/dL  Glucose, capillary     Status: Abnormal   Collection Time: 08/02/24  3:50 AM  Result Value Ref Range   Glucose-Capillary 129 (H) 70 - 99 mg/dL  Glucose, capillary     Status: Abnormal   Collection Time: 08/02/24  7:34 AM  Result Value Ref Range   Glucose-Capillary 134 (H) 70 - 99 mg/dL  Lactic acid, plasma     Status: Abnormal   Collection Time: 08/02/24 10:11 AM  Result Value Ref Range   Lactic Acid, Venous 2.8 (HH) 0.5 - 1.9 mmol/L  Blood transfusion report - scanned     Status: None ()   Collection Time: 08/02/24 11:10 AM   Narrative    Ordered by an unspecified provider.  Blood transfusion report - scanned     Status: None   Collection Time: 08/02/24 11:10 AM   Narrative   Ordered by an unspecified provider.  Glucose, capillary     Status: Abnormal   Collection Time: 08/02/24 11:28 AM  Result Value Ref Range   Glucose-Capillary 100 (H) 70 - 99 mg/dL  Lactic acid, plasma     Status: Abnormal   Collection Time: 08/02/24  1:42 PM  Result Value Ref Range   Lactic Acid, Venous 2.4 (HH) 0.5 - 1.9 mmol/L  Aerobic/Anaerobic Culture w Gram Stain (surgical/deep wound)     Status: None (Preliminary result)   Collection Time: 08/02/24  3:20 PM   Specimen: Path fluid; Body Fluid  Result Value Ref Range   Specimen Description FLUID    Special Requests ASPIRATE    Gram Stain      MODERATE WBC PRESENT, PREDOMINANTLY PMN MODERATE GRAM NEGATIVE RODS Performed at [facility], [address] [place], [place] [address]    Culture PENDING    Report Status PENDING   Glucose, capillary     Status: Abnormal   Collection Time: 08/02/24  4:00 PM  Result Value Ref Range   Glucose-Capillary 106 (H) 70 - 99 mg/dL  Glucose, capillary     Status: None   Collection Time: 08/02/24  7:33 PM  Result Value Ref Range   Glucose-Capillary 94 70 - 99 mg/dL    Assessment & Plan: The plan of care was discussed with the bedside nurse for the day, who is in agreement with this plan and no additional concerns were raised.   Present on Admission:  Sepsis (HCC)    LOS: 1 day   Additional comments:I reviewed the patient's new clinical lab test results.   and I reviewed the patients new imaging test results.    Assessment    Jacqueline Mathews is an 89 y.o. female who presented from SNF with AMS, hypoxia, and c/f GI bleed. Found to have large cystic mass on LLQ concerning for gynecologic origin as well as large hiatal hernia with GOO.   Plan  - Patient is very high risk for surgery.  Please see NSQIP calculator below. 62.1% risk of  death. I am in agreement with Dr. Cindi assessment and think that risks of surgery are too high to consider surgery at this time to be an acceptable option. Patient with no identifiable family and primary service believe patient may be extubatable in the next 24h. If so, a realistic discussion with the patient would be most appropriate.  - s/p IR drainage 2/5 of L adnexal cystic mass with 500cc purulent drainage - s/p EGD 2/5 with endoscopic de-torsion of the gastric volvulus and no ischemic changes noted - Protonix  80 BID given GI bleed - FEN - strict NPO, IVF per CCM, consider TPN - Continue OGT  - DVT - SCDs, hold chemical ppx due to bleeding concerns - Dispo - ICU, remainder of care per CCM       Jacqueline GEANNIE Hanger, MD Trauma & General Surgery Please use [email or web address] to contact on call provider  08/02/2024  *Care during the described time interval was provided by me. I have reviewed this patient's available data, including medical history, events of note, physical examination and test results as part of my evaluation.  "

## 2024-08-02 NOTE — Progress Notes (Signed)
 eLink Physician-Brief Progress Note Patient Name: Jacqueline Mathews DOB: 1934-08-24 MRN: [id number]   Date of Service  08/02/2024  HPI/Events of Note  Brief runs of SVT. BP soft.  eICU Interventions  Levophed  replaced with Phenylephrine  gtt, BMP, BNP, Mg++ tests ordered, iv Metoprolol   PRN RVR.        [name] 08/02/2024, 10:53 PM

## 2024-08-02 NOTE — Progress Notes (Signed)
 Patient is estranged from her family. No family or POA to give consent for procedures.  Two physician for consent needed for EGD and bedside US   guided pelvic cyst drainage by IR.  I agree to the performance of these two procedures listed above and agree to be the second physician consenting for the patient to have these procedures performed.  Dorn Chill, MD [facility] Office: [phone]   See Amion for personal pager PCCM on call pager [phone] until 7pm. Please call Elink 7p-7a. [phone]

## 2024-08-02 NOTE — Op Note (Signed)
 [facility] Patient Name: Jacqueline Mathews Procedure Date : 08/02/2024 MRN: [id number] Attending MD: Elspeth SQUIBB. Leigh , MD, [id number] Date of Birth: 09/27/1934 CSN: [id number] Age: 89 Admit Type: Inpatient Procedure:                Upper GI endoscopy Indications:              Coffee-ground emesis, Abnormal CT of the GI tract -                            known large HH. Concern for gastric volvulus on CT                            scan, lactic acidosis, esophageal thickening. OG                            was in place to decompress the stomach. Patient is                            intubated. Providers:                Elspeth SQUIBB. Leigh, MD, Darleene Bare, RN, Fairy Marina, Technician Referring MD:              Medicines:                Sedation per ICU team, patient intubated Complications:            No immediate complications. Estimated blood loss:                            None. Estimated Blood Loss:     Estimated blood loss: none. Procedure:                Pre-Anesthesia Assessment:                           - Prior to the procedure, a History and Physical                            was performed, and patient medications and                            allergies were reviewed. The patient's tolerance of                            previous anesthesia was also reviewed. The risks                            and benefits of the procedure and the sedation                            options and risks were discussed with the patient.  All questions were answered, and informed consent                            was obtained. Prior Anticoagulants: The patient has                            taken no anticoagulant or antiplatelet agents. ASA                            Grade Assessment: IV - A patient with severe                            systemic disease that is a constant threat to life.                             After reviewing the risks and benefits, the patient                            was deemed in satisfactory condition to undergo the                            procedure.                           After obtaining informed consent, the endoscope was                            passed under direct vision. Throughout the                            procedure, the patient's blood pressure, pulse, and                            oxygen saturations were monitored continuously. The                            [id number]-H190 ([id number]) Olympus endoscope was introduced                            through the mouth, and advanced to the second part                            of duodenum. The upper GI endoscopy was                            accomplished without difficulty. The patient                            tolerated the procedure well. Scope In: Scope Out: Findings:      The Z-line was regular.      LA Grade D esophagitis with no bleeding was found with some darkly       pigmented areas, concerning for early acute esophageal necrosis. This       was limited to the  distal esophagus only. No active bleeding.      The exam of the esophagus was otherwise normal.      A large hiatal hernia was present with the majority of the stomach in       the chest. Was difficult to measure and retroflex with good views of the       cardia with some residual secretions. There was concern for gastric       vovlulus. The stomach was tortous due to volvulus, but antrum reached       and stomach decompressed. No ischemic changes of the stomach appreciated.      A small amount of food residue and dark old blood was found in the       gastric body and suctioned. I could not completely clear the gastric       body.      The exam of the stomach was otherwise normal.      One non-bleeding cratered duodenal ulcer with no stigmata of bleeding       was found in the duodenal bulb. The lesion was 20 mm in largest       dimension.       The exam of the duodenum was otherwise normal. Impression:               - Z-line regular.                           - LA Grade D esophagitis with evidence of early                            acute esophageal necrosis of the distal esophagus -                            this is the cause of her prior bleeding symptoms.                           - Large hiatal hernia with the majority of the                            stomach in the chest, with gastric volvulus, which                            was reduced with the scope.                           - Food (residue) in the stomach.                           - Non-bleeding duodenal ulcer with no stigmata of                            bleeding.                           Difficult situation - at risk for recurrent                            volvulus without repair of  the hiatal hernia, which                            would be a large undertaking for this patient and                            high risk for mortality per surgery. Could consider                            asking surgery or IR to place a button PEG to tack                            the wall of the stomach to the abdominal wall and                            prevent recurrent volvulus. Without intervention on                            the hernia she is very high risk for recurrent                            volvulus.                           Otherwise, bleeding is secondary to acute                            esophageal necrosis / severe esophagitis, which is                            secondary to the volvulus. Hopefully this resolves                            with PPI and time. High risk for esophageal                            stricturing as she heals. Please avoid NG tube in                            this patient if possible Recommendation:           - Remain ICU for ongoing care.                           - NPO for now while intubated                           -  Extubate potentially today per ICU team                           - NPO for today                           - Consider clear liquid diet tomorrow pending her  course, avoid NG tube if possible given esophageal                            necrosis                           - Recommendation for hiatal hernia treatment as                            outlined - high risk for recurrent volvulus without                            definitive therapy. She is high risk for hiatal                            hernia repair, consider PEG if she is a candidate                            for that                           - Continue present medications - protonix  40mg  BID                            indefinitely                           - Call with questions, we will sign off for now Procedure Code(s):        --- Professional ---                           [phone], Esophagogastroduodenoscopy, flexible,                            transoral; diagnostic, including collection of                            specimen(s) by brushing or washing, when performed                            (separate procedure) Diagnosis Code(s):        --- Professional ---                           K20.90, Esophagitis, unspecified without bleeding                           K44.9, Diaphragmatic hernia without obstruction or                            gangrene                           K26.9, Duodenal ulcer, unspecified as acute or  chronic, without hemorrhage or perforation                           K92.0, Hematemesis                           R93.3, Abnormal findings on diagnostic imaging of                            other parts of digestive tract CPT copyright 2022 [employer]. All rights reserved. The codes documented in this report are preliminary and upon coder review may  be revised to meet current compliance requirements. Elspeth P. Leigh, MD 08/02/2024  3:58:58 PM This report has been signed electronically. Number of Addenda: 0

## 2024-08-02 NOTE — Progress Notes (Signed)
 Initial Nutrition Assessment  DOCUMENTATION CODES:   Severe malnutrition in context of chronic illness, Underweight  INTERVENTION:  Monitor surgery and GI plan following extubation and EGD  Pt is at risk for refeeding syndrome given severe malnutrition. Monitor magnesium and phosphorus daily x 4 occurrences or until WNL, MD to replete as needed.  Add 100 mg thiamine daily (IV if needed) when plan of care established  NUTRITION DIAGNOSIS:   Severe Malnutrition related to chronic illness as evidenced by severe fat depletion, severe muscle depletion.  GOAL:   Patient will meet greater than or equal to 90% of their needs  MONITOR:   Vent status, Diet advancement  REASON FOR ASSESSMENT:   Ventilator    ASSESSMENT:   Pt with hx of HTN and glaucoma. Admitted from [facility] with AMS, hypoxia, and coffee ground emesis.  2/4 admitted, intubated  Patient is currently intubated on ventilator support but participating in SBT and tolerating well. Plan for extubation following EGD today. Surgery and GI consulted for concern of GOO 2/2 to large hiatal hernia and esophagitis. Recommend GYN consult for large cystic mass concerning for neoplasm. Acute bleeding has stopped and no bloody NG output overnight.  Pt remains on low dose levo support.   Pt awake at time of assessment but remained on vent. Unable to obtain diet hx at this time. Nutrition focused physical exam shows severe muscle and severe fat depletions, indicative of severe malnutrition. Etiology likely related to chronic illness.   Pt at high risk for refeeding syndrome, recommend adding thiamine 100 mg IV when plan of care is determined. Continue to monitor electrolytes and replete as needed.  Will monitor surgery and GI recommendations for plan based on how pt does once extubated.   Admit weight: 44 kg   Drains/Lines: OG tube gastric per xray (-20 ml x 12 h low intermittent suction) Urethral Catheter UOP: 388 ml x 12  h  Nutritionally Relevant Medications: Scheduled Meds:  pantoprazole  (PROTONIX ) IV  80 mg Intravenous Q12H   polyethylene glycol  17 g Per Tube Daily   senna  1 tablet Per Tube BID   Continuous Infusions:  sodium chloride  10 mL/hr at 08/02/24 1000   ceFEPime  (MAXIPIME ) IV Stopped (08/02/24 0553)   fentaNYL  infusion INTRAVENOUS 25 mcg/hr (08/02/24 1000)   norepinephrine  (LEVOPHED ) Adult infusion 4 mcg/min (08/02/24 1000)   vancomycin      PRN Meds:.fentaNYL , midazolam  PF, mouth rinse, polyethylene glycol, senna  Labs Reviewed: Sodium 134 (low) Potassium 4.3 (WNL) Phos 3.4 (WNL) Mag 2.1 (WNL) CBG ranges from 129-168 mg/dL over the last 24 hours   NUTRITION - FOCUSED PHYSICAL EXAM:  Flowsheet Row Most Recent Value  Orbital Region Severe depletion  Upper Arm Region Severe depletion  Thoracic and Lumbar Region Severe depletion  Buccal Region Unable to assess  Temple Region Severe depletion  Clavicle Bone Region Severe depletion  Clavicle and Acromion Bone Region Severe depletion  Scapular Bone Region Severe depletion  Dorsal Hand Unable to assess  Patellar Region Severe depletion  Anterior Thigh Region Severe depletion  Posterior Calf Region Severe depletion  Edema (RD Assessment) None  Hair Reviewed  Eyes Reviewed  Mouth Unable to assess  Skin Reviewed  Nails Unable to assess    Diet Order:   Diet Order             Diet NPO time specified  Diet effective now                   EDUCATION  NEEDS:   Not appropriate for education at this time  Skin:  Skin Assessment: Skin Integrity Issues: Traumatic wound: nose L Unstageable: R heel DTI: L toe DTI: L heel  Last BM:  unknown  Height:   Ht Readings from Last 1 Encounters:  08/01/24 5' 4 (1.626 m)    Weight:   Wt Readings from Last 1 Encounters:  08/02/24 44 kg    Ideal Body Weight:  54.5 kg  BMI:  Body mass index is 16.65 kg/m.  Estimated Nutritional Needs:   Kcal:  1350-1550  Protein:   60-75g  Fluid:  1.4L    Josette Glance, MS, RDN, LDN Clinical Dietitian I Please reach out via secure chat

## 2024-08-02 NOTE — Consult Note (Signed)
 Reason for Consult:large cystic pelvic mass Referring Physician: Eugenie Mathews is an 89 y.o. female. With 18 cm cystic pelvic mass causing signnificant mass effect in abdomen, potentially causing ureteral compression/urinary outlet obstruction which could be the cause of her septic presentation  Probably benign ovarian neoplasm but even if it is malignant temporizing with drainage would be indicated given her condition If she recovers can consider removal in the future  Discussed with IR who agree to plan     Review of Systems  Blood pressure 97/71, pulse (!) 120, temperature (!) 97.3 F (36.3 C), temperature source Oral, resp. rate 12, height 5' 4 (1.626 m), weight 44 kg, SpO2 97%. Physical Exam  Results for orders placed or performed during the hospital encounter of 08/01/24 (from the past 48 hours)  I-Stat CG4 Lactic Acid, ED ([facility] and [facility] ONLY)     Status: Abnormal   Collection Time: 08/01/24  4:12 PM  Result Value Ref Range   Lactic Acid, Venous 5.0 (HH) 0.5 - 1.9 mmol/L   Comment NOTIFIED PHYSICIAN   I-Stat venous blood gas, ED (Not at [facility] or [facility])     Status: Abnormal   Collection Time: 08/01/24  4:12 PM  Result Value Ref Range   pH, Ven 7.331 7.25 - 7.43   pCO2, Ven 47.6 44 - 60 mmHg   pO2, Ven 49 (H) 32 - 45 mmHg   Bicarbonate 25.2 20.0 - 28.0 mmol/L   TCO2 27 22 - 32 mmol/L   O2 Saturation 81 %   Acid-base deficit 1.0 0.0 - 2.0 mmol/L   Sodium 135 135 - 145 mmol/L   Potassium 4.6 3.5 - 5.1 mmol/L   Calcium, Ion 1.07 (L) 1.15 - 1.40 mmol/L   HCT 31.0 (L) 36.0 - 46.0 %   Hemoglobin 10.5 (L) 12.0 - 15.0 g/dL   Sample type VENOUS   I-Stat Chem 8, ED     Status: Abnormal   Collection Time: 08/01/24  4:12 PM  Result Value Ref Range   Sodium 135 135 - 145 mmol/L   Potassium 4.6 3.5 - 5.1 mmol/L   Chloride 100 98 - 111 mmol/L   BUN 29 (H) 8 - 23 mg/dL   Creatinine, Ser 8.89 (H) 0.44 - 1.00 mg/dL   Glucose, Bld 891 (H) 70 - 99 mg/dL    Comment: Glucose  reference range applies only to samples taken after fasting for at least 8 hours.   Calcium, Ion 1.07 (L) 1.15 - 1.40 mmol/L   TCO2 24 22 - 32 mmol/L   Hemoglobin 10.9 (L) 12.0 - 15.0 g/dL   HCT 67.9 (L) 63.9 - 53.9 %  CBC with Differential     Status: Abnormal   Collection Time: 08/01/24  4:40 PM  Result Value Ref Range   WBC 17.9 (H) 4.0 - 10.5 K/uL   RBC 3.23 (L) 3.87 - 5.11 MIL/uL   Hemoglobin 10.4 (L) 12.0 - 15.0 g/dL   HCT 66.7 (L) 63.9 - 53.9 %   MCV 102.8 (H) 80.0 - 100.0 fL   MCH 32.2 26.0 - 34.0 pg   MCHC 31.3 30.0 - 36.0 g/dL   RDW 82.6 (H) 88.4 - 84.4 %   Platelets 353 150 - 400 K/uL    Comment: REPEATED TO VERIFY PLATELET COUNT CONFIRMED BY SMEAR    nRBC 0.0 0.0 - 0.2 %   Neutrophils Relative % 87 %   Neutro Abs 15.5 (H) 1.7 - 7.7 K/uL   Lymphocytes Relative 5 %  Lymphs Abs 1.0 0.7 - 4.0 K/uL   Monocytes Relative 4 %   Monocytes Absolute 0.7 0.1 - 1.0 K/uL   Eosinophils Relative 0 %   Eosinophils Absolute 0.0 0.0 - 0.5 K/uL   Basophils Relative 1 %   Basophils Absolute 0.1 0.0 - 0.1 K/uL   Immature Granulocytes 3 %   Abs Immature Granulocytes 0.52 (H) 0.00 - 0.07 K/uL    Comment: Performed at [facility], [address] [place], [place] [address]  Comprehensive metabolic panel     Status: Abnormal   Collection Time: 08/01/24  4:40 PM  Result Value Ref Range   Sodium 135 135 - 145 mmol/L   Potassium 4.9 3.5 - 5.1 mmol/L   Chloride 98 98 - 111 mmol/L   CO2 23 22 - 32 mmol/L   Glucose, Bld 109 (H) 70 - 99 mg/dL    Comment: Glucose reference range applies only to samples taken after fasting for at least 8 hours.   BUN 28 (H) 8 - 23 mg/dL   Creatinine, Ser 9.10 0.44 - 1.00 mg/dL   Calcium 8.6 (L) 8.9 - 10.3 mg/dL   Total Protein 5.7 (L) 6.5 - 8.1 g/dL   Albumin 2.2 (L) 3.5 - 5.0 g/dL   AST 50 (H) 15 - 41 U/L    Comment: HEMOLYSIS AT THIS LEVEL MAY AFFECT RESULT   ALT 77 (H) 0 - 44 U/L   Alkaline Phosphatase 418 (H) 38 - 126 U/L   Total Bilirubin  0.6 0.0 - 1.2 mg/dL   GFR, Estimated >39 >39 mL/min    Comment: (NOTE) Calculated using the CKD-EPI Creatinine Equation (2021)    Anion gap 14 5 - 15    Comment: Performed at [facility], [address] [place], [place] [address]  Protime-INR     Status: None   Collection Time: 08/01/24  4:40 PM  Result Value Ref Range   Prothrombin Time 14.0 11.4 - 15.2 seconds   INR 1.0 0.8 - 1.2    Comment: (NOTE) INR goal varies based on device and disease states. Performed at [facility], [address], [place], [place] [address]   Type and screen [facility]     Status: None   Collection Time: 08/01/24  4:40 PM  Result Value Ref Range   ABO/RH(D) O POS    Antibody Screen NEG    Sample Expiration 08/04/2024,2359    Unit Number [id number]    Blood Component Type RED CELLS,LR    Unit division 00    Status of Unit ISSUED,FINAL    Unit tag comment EMERGENCY RELEASE    Transfusion Status OK TO TRANSFUSE    Crossmatch Result COMPATIBLE   I-Stat arterial blood gas, ED     Status: Abnormal   Collection Time: 08/01/24  4:42 PM  Result Value Ref Range   pH, Arterial 7.369 7.35 - 7.45   pCO2 arterial 48.0 32 - 48 mmHg   pO2, Arterial 159 (H) 83 - 108 mmHg   Bicarbonate 27.7 20.0 - 28.0 mmol/L   TCO2 29 22 - 32 mmol/L   O2 Saturation 99 %   Acid-Base Excess 2.0 0.0 - 2.0 mmol/L   Sodium 137 135 - 145 mmol/L   Potassium 3.8 3.5 - 5.1 mmol/L   Calcium, Ion 1.15 1.15 - 1.40 mmol/L   HCT 31.0 (L) 36.0 - 46.0 %   Hemoglobin 10.5 (L) 12.0 - 15.0 g/dL   Collection site RADIAL,  ALLEN'S TEST ACCEPTABLE    Drawn by RT    Sample type ARTERIAL   Urinalysis, Routine w reflex microscopic -Urine, Catheterized     Status: Abnormal   Collection Time: 08/01/24  7:13 PM  Result Value Ref Range   Color, Urine YELLOW YELLOW   APPearance TURBID (A) CLEAR   Specific Gravity, Urine 1.009 1.005 - 1.030   pH 5.0 5.0 - 8.0   Glucose, UA NEGATIVE NEGATIVE mg/dL   Hgb urine  dipstick MODERATE (A) NEGATIVE   Bilirubin Urine NEGATIVE NEGATIVE   Ketones, ur NEGATIVE NEGATIVE mg/dL   Protein, ur 30 (A) NEGATIVE mg/dL   Nitrite NEGATIVE NEGATIVE   Leukocytes,Ua LARGE (A) NEGATIVE   RBC / HPF 21-50 0 - 5 RBC/hpf   WBC, UA >50 0 - 5 WBC/hpf   Bacteria, UA MANY (A) NONE SEEN   Squamous Epithelial / HPF 0-5 0 - 5 /HPF   WBC Clumps PRESENT    Hyaline Casts, UA PRESENT     Comment: Performed at [facility], [address] [place], [place] [address]  MRSA Next Gen by PCR, Nasal     Status: None   Collection Time: 08/01/24  8:11 PM   Specimen: Nasal Mucosa; Nasal Swab  Result Value Ref Range   MRSA by PCR Next Gen NOT DETECTED NOT DETECTED    Comment: (NOTE) The GeneXpert MRSA Assay (FDA approved for NASAL specimens only), is one component of a comprehensive MRSA colonization surveillance program. It is not intended to diagnose MRSA infection nor to guide or monitor treatment for MRSA infections. Test performance is not FDA approved in patients less than 18 years old. Performed at [facility], [address], [place], [place] [address]   Glucose, capillary     Status: Abnormal   Collection Time: 08/01/24  8:21 PM  Result Value Ref Range   Glucose-Capillary 157 (H) 70 - 99 mg/dL    Comment: Glucose reference range applies only to samples taken after fasting for at least 8 hours.  Lactic acid, plasma     Status: Abnormal   Collection Time: 08/01/24  9:04 PM  Result Value Ref Range   Lactic Acid, Venous 3.7 (HH) 0.5 - 1.9 mmol/L    Comment: Critical Value, Read Back and verified with CHARLENA DECEMBER, RN 08/01/24 2254 BY DOROTHA GRIPPE Performed at [facility], [address] [place], [place] [address]   Culture, blood (Routine X 2) w Reflex to ID Panel     Status: None (Preliminary result)   Collection Time: 08/01/24  9:30 PM   Specimen: BLOOD  Result Value Ref Range   Specimen Description BLOOD SITE NOT SPECIFIED    Special Requests      BOTTLES  DRAWN AEROBIC AND ANAEROBIC Blood Culture adequate volume   Culture      NO GROWTH < 12 HOURS Performed at [facility], [address] [place], [place] [address]    Report Status PENDING   Culture, blood (Routine X 2) w Reflex to ID Panel     Status: None (Preliminary result)   Collection Time: 08/01/24  9:57 PM   Specimen: BLOOD  Result Value Ref Range   Specimen Description BLOOD SITE NOT SPECIFIED    Special Requests      BOTTLES DRAWN AEROBIC AND ANAEROBIC Blood Culture results may not be optimal due to an inadequate volume of blood received in culture bottles   Culture      NO  GROWTH < 12 HOURS Performed at [facility], [address] [place], [place] [address]    Report Status PENDING   Prepare RBC (crossmatch)     Status: None   Collection Time: 08/01/24  9:57 PM  Result Value Ref Range   Order Confirmation      ORDER PROCESSED BY BLOOD BANK Performed at [facility], [address], [place], [place] [address]   Glucose, capillary     Status: Abnormal   Collection Time: 08/01/24 11:25 PM  Result Value Ref Range   Glucose-Capillary 168 (H) 70 - 99 mg/dL    Comment: Glucose reference range applies only to samples taken after fasting for at least 8 hours.  CBC     Status: Abnormal   Collection Time: 08/02/24  3:19 AM  Result Value Ref Range   WBC 16.0 (H) 4.0 - 10.5 K/uL   RBC 4.15 3.87 - 5.11 MIL/uL   Hemoglobin 12.9 12.0 - 15.0 g/dL    Comment: REPEATED TO VERIFY   HCT 38.4 36.0 - 46.0 %   MCV 92.5 80.0 - 100.0 fL    Comment: DELTA CHECK NOTED REPEATED TO VERIFY    MCH 31.1 26.0 - 34.0 pg   MCHC 33.6 30.0 - 36.0 g/dL   RDW 80.9 (H) 88.4 - 84.4 %   Platelets 366 150 - 400 K/uL   nRBC 0.0 0.0 - 0.2 %    Comment: Performed at [facility], [address], [place], [place] [address]  Basic metabolic panel     Status: Abnormal   Collection Time: 08/02/24  3:19 AM  Result Value Ref Range   Sodium 134 (L) 135 - 145 mmol/L   Potassium 4.3  3.5 - 5.1 mmol/L   Chloride 101 98 - 111 mmol/L   CO2 23 22 - 32 mmol/L   Glucose, Bld 139 (H) 70 - 99 mg/dL    Comment: Glucose reference range applies only to samples taken after fasting for at least 8 hours.   BUN 28 (H) 8 - 23 mg/dL   Creatinine, Ser 9.13 0.44 - 1.00 mg/dL   Calcium 8.1 (L) 8.9 - 10.3 mg/dL   GFR, Estimated >39 >39 mL/min    Comment: (NOTE) Calculated using the CKD-EPI Creatinine Equation (2021)    Anion gap 10 5 - 15    Comment: Performed at [facility], [address], [place], [place] [address]  Magnesium     Status: None   Collection Time: 08/02/24  3:19 AM  Result Value Ref Range   Magnesium 2.1 1.7 - 2.4 mg/dL    Comment: Performed at [facility], [address], [place], [place] [address]  Phosphorus     Status: None   Collection Time: 08/02/24  3:19 AM  Result Value Ref Range   Phosphorus 3.4 2.5 - 4.6 mg/dL    Comment: Performed at [facility], [address] [place], [place] [address]  Glucose, capillary     Status: Abnormal   Collection Time: 08/02/24  3:50 AM  Result Value Ref Range   Glucose-Capillary 129 (H) 70 - 99 mg/dL    Comment: Glucose reference range applies only to samples taken after fasting for at least 8 hours.  Glucose, capillary     Status: Abnormal   Collection Time: 08/02/24  7:34 AM  Result Value Ref Range   Glucose-Capillary 134 (H) 70 - 99 mg/dL    Comment: Glucose reference range  applies only to samples taken after fasting for at least 8 hours.  Lactic acid, plasma     Status: Abnormal   Collection Time: 08/02/24 10:11 AM  Result Value Ref Range   Lactic Acid, Venous 2.8 (HH) 0.5 - 1.9 mmol/L    Comment: Critical value noted. Value is consistent with previously reported and called value  Performed at [facility], [address], [place], [place] [address]   Glucose, capillary     Status: Abnormal   Collection Time: 08/02/24 11:28 AM  Result Value Ref Range   Glucose-Capillary 100 (H) 70 - 99  mg/dL    Comment: Glucose reference range applies only to samples taken after fasting for at least 8 hours.    CT Angio Abd/Pel W and/or Wo Contrast Result Date: 08/01/2024 EXAM: CTA ABDOMEN AND PELVIS WITHOUT AND WITH CONTRAST 08/01/2024 06:01:00 PM TECHNIQUE: CTA images of the abdomen and pelvis without and with intravenous contrast. 75 mL iohexol  (OMNIPAQUE ) 350 MG/ML injection. Three-dimensional MIP/volume rendered formations were performed. Automated exposure control, iterative reconstruction, and/or weight based adjustment of the mA/kV was utilized to reduce the radiation dose to as low as reasonably achievable. COMPARISON: 10/30/2019 CLINICAL HISTORY: hematemesis. AMS. Intubated. Distended abdomen. FINDINGS: VASCULATURE: Aortic atherosclerotic calcification. AORTA: Aortic atherosclerotic calcification. No acute finding. No abdominal aortic aneurysm. No dissection. CELIAC TRUNK: No acute finding. No occlusion or significant stenosis. SUPERIOR MESENTERIC ARTERY: No acute finding. No occlusion or significant stenosis. RENAL ARTERIES: No acute finding. No occlusion or significant stenosis. ILIAC ARTERIES: No acute finding. No occlusion or significant stenosis. LIVER: Innumerable hepatic cysts. GALLBLADDER AND BILE DUCTS: Distended gallbladder. No biliary ductal dilatation. SPLEEN: The spleen is unremarkable. PANCREAS: The pancreas is unremarkable. ADRENAL GLANDS: Bilateral adrenal glands demonstrate no acute abnormality. KIDNEYS, URETERS AND BLADDER: Absent left kidney. Large extrarenal pelvis in the right kidney, slightly increased in size since 2021. Foley catheter and gas in the bladder. Nonobstructing right renal calculi. No hydronephrosis. No perinephric or periureteral stranding. GI AND BOWEL: Large hiatal hernia containing most of the stomach in the chest. The stomach demonstrates mesenteroaxial rotation with possible gastric outlet obstruction. Enteric tube tip in the stomach. Moderate stool in the  rectum. There is no bowel obstruction. No abnormal bowel wall thickening or distension. REPRODUCTIVE: A large cystic pelvic mass is present, measuring 18.9 x 13.1 x 17.4 cm. This is new since the most recent comparison of 10/30/2019. There is mass effect on the bladder which is displaced to the right as well as the small bowel and colon. The origin of the mass is unclear. It appears distinct from the uterus. Most likely source is an adnexal/ovarian cystic lesion. PERITONEUM AND RETROPERITONEUM: No ascites or free air. LUNG BASE: Findings in the chest reported separately. LYMPH NODES: No lymphadenopathy. BONES AND SOFT TISSUES: Scoliosis. No acute abnormality of the bones. No acute soft tissue abnormality. IMPRESSION: 1. Large complex cystic mass in the left lower abdomen, new since 10/30/19, measuring 18.9 cm. The origin is unclear but appears distinct from the uterus, with the most likely source being adnexal/ovarian concerning for low-grade cystic neoplasm. Gynecology consultation recommended. 2. Large hiatal hernia containing most of the stomach in the chest, with mesenteroaxial rotation and possible gastric outlet obstruction. Electronically signed by: Norman Gatlin MD 08/01/2024 06:37 PM EST RP Workstation: [id number]   CT Chest W Contrast Result Date: 08/01/2024 EXAM: CT CHEST WITH CONTRAST 08/01/2024 06:01:00 PM TECHNIQUE: CT of the chest was performed with the administration of 75 mL of iohexol  (OMNIPAQUE )  350 MG/ML injection. Multiplanar reformatted images are provided for review. Automated exposure control, iterative reconstruction, and/or weight based adjustment of the mA/kV was utilized to reduce the radiation dose to as low as reasonably achievable. COMPARISON: Comparison with same day x-ray and CT chest 10/16/2024. CLINICAL HISTORY: Eval for pneumonia. Hematemesis, altered mental status. FINDINGS: MEDIASTINUM: Heart and pericardium: Coronary artery and aortic atherosclerotic calcification. Central  airways: Endotracheal tube in the intrathoracic trachea. Layering debris in the trachea and left mainstem bronchus with occlusive debris in the left lower lobe bronchi. Esophagus: Diffuse wall thickening and mucosal hyperenhancement of the esophagus compatible with esophagitis. Hiatal hernia: Large hiatal hernia containing most of the stomach in the chest. There is mesenteraxial rotation of the stomach. Air-fluid level in the stomach. Gastric outlet obstruction is not excluded. LYMPH NODES: No mediastinal, hilar or axillary lymphadenopathy. LUNGS AND PLEURA: Lungs: Emphysema. Scattered centrilobular micronodules and tree-in-bud opacities. Complete atelectasis of the left lower lobe with some low density edema compatible with pneumonia. Pleura: Moderate left pleural effusion. Small right pleural effusion.  No pneumothorax. SOFT TISSUES/BONES: No acute abnormality of the bones or soft tissues. UPPER ABDOMEN: Subdiaphragmatic enteric tube. Innumerable hepatic cysts. No other acute abnormality. IMPRESSION: 1. Complete collapse of the left lower lobe with postobstructive pneumonia. Moderate left pleural effusion. 2. Large hiatal hernia containing most of the stomach in the chest with mesenteraxial rotation and air-fluid level. Gastric outlet obstruction is not excluded. 3. Diffuse wall thickening and mucosal hyperenhancement of the esophagus compatible with esophagitis. Electronically signed by: Norman Gatlin MD 08/01/2024 06:25 PM EST RP Workstation: [id number]   CT Head Wo Contrast Result Date: 08/01/2024 EXAM: CT HEAD WITHOUT CONTRAST 08/01/2024 06:01:00 PM TECHNIQUE: CT of the head was performed without the administration of intravenous contrast. Automated exposure control, iterative reconstruction, and/or weight based adjustment of the mA/kV was utilized to reduce the radiation dose to as low as reasonably achievable. COMPARISON: 5 / 4 / 21 CLINICAL HISTORY: FINDINGS: BRAIN AND VENTRICLES: No acute hemorrhage. No  evidence of acute infarct. No hydrocephalus. No extra-axial collection. No mass effect or midline shift. 5 mm calcified extra-axial lesion along left para midline falx anteriorly. Most likely meningioma. Generalized cerebral volume loss. Scattered hypoattenuating foci in supratentorial white matter, nonspecific, likely chronic microangiopathic changes. Intracranial atherosclerosis. ORBITS: No acute abnormality. SINUSES: Mild scattered paranasal sinus mucosal thickening. Opacification of right mastoid air cells. SOFT TISSUES AND SKULL: No acute soft tissue abnormality. No skull fracture. IMPRESSION: 1. No acute intracranial abnormality. Electronically signed by: Norman Gatlin MD 08/01/2024 06:10 PM EST RP Workstation: [id number]   DG Chest Portable 1 View Result Date: 08/01/2024 CLINICAL DATA:  Status post intubation EXAM: PORTABLE CHEST 1 VIEW COMPARISON:  Oct 30, 2019 FINDINGS: The heart size and mediastinal contours are within normal limits. Endotracheal tube is in grossly good position. Nasogastric tube tip is seen in proximal stomach. Right lung is clear. Minimal left basilar atelectasis or infiltrate is noted with possible small left pleural effusion. The visualized skeletal structures are unremarkable. IMPRESSION: Endotracheal and nasogastric tubes are in grossly good position. Minimal left basilar atelectasis or infiltrate is noted with possible small left pleural effusion. Electronically Signed   By: Lynwood Landy Raddle M.D.   On: 08/01/2024 16:00    Assessment/Plan: 18 cm cystic pelvic mass, likely ovarian, l;ikely benign ovarian neoplasm given appearance and size  Recommend IR drainage for interval decompression with cytology, leave in drain Consider surgical removal in future after recovery from this seriopus acute septic presentation  Jacqueline Mathews Jacqueline Mathews  Jacqueline Mathews 08/02/2024

## 2024-08-02 NOTE — Consult Note (Signed)
 "     Chief Complaint: Patient was seen in consultation today for cystic pelvic mass   Referring Physician(s): Dr. Jayne   Supervising Physician: Hughes Simmonds  Patient Status: [facility] - In-pt  History of Present Illness: Jacqueline Mathews is a 89 y.o. female nursing home resident who was found unresponsive the morning of 2//26. O2 saturation 50% upon arrival and she was emergently intubated. She was also tachypneic and hypothermic. Imaging showed a large hiatal hernia and there was concern for gastric outlet obstruction. Imaging also revealed a large cystic lesion. GYN consulted with recommendation made for percutaneous aspiration with possible drain placement. Imaging reviewed and procedure approved by Dr. Hughes.   No past medical history on file.  No past surgical history on file.  Allergies: Clarithromycin, Penicillin g benzathine, Penicillins, and Sulfamethoxazole-trimethoprim  Medications: Prior to Admission medications  Medication Sig Start Date End Date Taking? Authorizing Provider  acetaminophen  (TYLENOL ) 500 MG tablet Take 1,000 mg by mouth in the morning and at bedtime.   Yes [provider]  amLODipine  (NORVASC ) 10 MG tablet Take 10 mg by mouth daily.   Yes [provider]  latanoprost  (XALATAN ) 0.005 % ophthalmic solution Place 1 drop into both eyes at bedtime.   Yes [provider]  magnesium oxide (MAG-OX) 400 (240 Mg) MG tablet Take 400 mg by mouth daily.   Yes [provider]  Nutritional Supplements (NUTRITIONAL SHAKE) LIQD Take 120 mLs by mouth in the morning and at bedtime.   Yes [provider]  ondansetron  (ZOFRAN -ODT) 4 MG disintegrating tablet Take 4 mg by mouth 3 (three) times daily before meals. 07/31/24  Yes [provider]  pantoprazole  (PROTONIX ) 40 MG tablet Take 1 tablet (40 mg total) by mouth daily at 6 (six) AM. 11/03/19  Yes Akula, Vijaya, MD  polyethylene glycol powder (GLYCOLAX /MIRALAX ) 17 GM/SCOOP powder  Take 17 g by mouth daily. Dissolve 1 capful (17g) in 4-8 ounces of liquid and take by mouth daily.   Yes [provider]  promethazine (PHENERGAN) 12.5 MG tablet Take 25 mg by mouth every 6 (six) hours as needed for nausea or vomiting. 07/29/24 08/03/24 Yes [provider]  senna-docusate (SENOKOT-S) 8.6-50 MG tablet Take 2 tablets by mouth 2 (two) times daily as needed for mild constipation or moderate constipation.   Yes [provider]  timolol  (TIMOPTIC ) 0.5 % ophthalmic solution Place 1 drop into both eyes 2 (two) times daily.   Yes [provider]     Family History  Problem Relation Age of Onset   Colon cancer Mother     Social History   Socioeconomic History   Marital status: Widowed    Spouse name: Not on file   Number of children: Not on file   Years of education: Not on file   Highest education level: Not on file  Occupational History   Not on file  Tobacco Use   Smoking status: Never   Smokeless tobacco: Never  Substance and Sexual Activity   Alcohol use: No    Alcohol/week: 0.0 standard drinks of alcohol   Drug use: No   Sexual activity: Not on file  Other Topics Concern   Not on file  Social History Narrative   Not on file   Social Drivers of Health   Tobacco Use: Not on file  Financial Resource Strain: Not on file  Food Insecurity: Unknown (05/29/2024)   Received from [facility]   Epic    Within the past 12  months, you worried that your food would run out before you got money to buy more: Patient declined to answer    Within the past 12 months, the food you bought just didn't last and you didn't have money to get more. : Patient declined to answer  Transportation Needs: Not on file (05/29/2024)  Physical Activity: Not on file  Stress: Not on file  Social Connections: Not on file  Depression (EYV7-0): Not on file  Alcohol Screen: Not on file  Housing: Not on file (05/29/2024)  Utilities: Unknown (05/29/2024)   Received  from [facility]   Utilities    In the past 12 months has the electric, gas, oil, or water company threatened to shut off services in your home? : Patient declined to answer  Health Literacy: Not on file    Review of Systems: A 12 point ROS discussed and pertinent positives are indicated in the HPI above.  All other systems are negative.  Review of Systems  Unable to perform ROS: Intubated    Vital Signs: BP 98/62   Pulse (!) 113   Temp (!) 97.3 F (36.3 C) (Oral)   Resp 20   Ht 5' 4 (1.626 m)   Wt 97 lb (44 kg)   SpO2 100%   BMI 16.65 kg/m   Physical Exam Constitutional:      Comments: Intubated/sedated   Pulmonary:     Comments: Intubated/sedated    Labs:  CBC: Recent Labs    08/01/24 1612 08/01/24 1640 08/01/24 1642 08/02/24 0319  WBC  --  17.9*  --  16.0*  HGB 10.9*  10.5* 10.4* 10.5* 12.9  HCT 32.0*  31.0* 33.2* 31.0* 38.4  PLT  --  353  --  366    COAGS: Recent Labs    08/01/24 1640  INR 1.0    BMP: Recent Labs    08/01/24 1612 08/01/24 1640 08/01/24 1642 08/02/24 0319  NA 135  135 135 137 134*  K 4.6  4.6 4.9 3.8 4.3  CL 100 98  --  101  CO2  --  23  --  23  GLUCOSE 108* 109*  --  139*  BUN 29* 28*  --  28*  CALCIUM  --  8.6*  --  8.1*  CREATININE 1.10* 0.89  --  0.86  GFRNONAA  --  >60  --  >60    LIVER FUNCTION TESTS: Recent Labs    08/01/24 1640  BILITOT 0.6  AST 50*  ALT 77*  ALKPHOS 418*  PROT 5.7*  ALBUMIN 2.2*    TUMOR MARKERS: No results for input(s): AFPTM, CEA, CA199, CHROMGRNA in the last 8760 hours.  Assessment and Plan:  Cystic pelvic mass: Jacqueline Mathews, 89 year old female, is scheduled today for an image-guided cystic pelvic mass aspiration with possible drain placement. IR team unable to reach family for consent and patient is critically ill. Two provider emergency consent obtained.   Thank you for this interesting consult.  I greatly enjoyed meeting Jacqueline Mathews and look forward  to participating in their care.  A copy of this report was sent to the requesting provider on this date.  Electronically Signed: Riverlyn Kizziah, AGACNP-BC 08/02/2024, 2:08 PM   I spent a total of 20 Minutes    in face to face in clinical consultation, greater than 50% of which was counseling/coordinating care for cystic pelvic mass   "

## 2024-08-02 NOTE — Consult Note (Signed)
 WOC Nurse Consult Note: Reason for Consult: Deep tissue injury to bilateral heels and left nasal dorsum abrasion Arrives from SNF and heel protectors are in place.  Wound type: pressure and trauma Pressure Injury POA: Yes Measurement: Left hallux valgus with intact deep tissue injury Right hallux valgus with intact deep tissue injury  Left lateral heel with deep tissue injury  Right heel with unstageable pressure injury, some drainage noted.  Left nose:  0.5 cm abrasion, partial thickness, she is currently intubated and there is no medical device in this area She did require bag mask ventilations when en route.    Wound bed: All deep tissue injuries are darkened .  Left nose, pink and moist  Right heel, scabbed and dark.  Drainage (amount, consistency, odor) minimal serosanguinous to left nose and right heel Periwound: Dry skin  Bunion creates a bony deformity at the metatarsophalangeal joint creating a pressure point.  Both great toes are rotated inwards.  Dressing procedure/placement/frequency: Cleanse wounds to bilateral great toes and heels with soap and water and pat dry. Foam dressing to right heel if draining.   May apply small (round) bandage to nasal wound if draining.   Will not follow at this time.  Please re-consult if needed.  Darice Cooley MSN, RN, FNP-BC CWON Wound, Ostomy, Continence Nurse Outpatient [facility] [phone] Work cell phone:  [phone]

## 2024-08-02 NOTE — Progress Notes (Signed)
 "  NAME:  Jacqueline Mathews, MRN:  [id number], DOB:  02/05/1935, LOS: 1 ADMISSION DATE:  08/01/2024, CONSULTATION DATE:  08/01/2024 REFERRING MD: ED, CHIEF COMPLAINT: Septic shock  History of Present Illness:  Jacqueline Mathews is a 89 year old female with past medical history significant for HTN and glaucoma who presented to the ED via EMS from [facility] with reports of AMS, hypoxia , and coffee-ground emesis.  On EMS arrival patient was seen with oxygen saturation 50% per bag mask assisted ventilations were provided.  Later patient became unresponsive and route to ED prompting emergent endotracheal intubation on admission.  Additionally patient was seen hypothermic and tachycardic on admission.  Lab work significant for alkaline phosphatase 418 albumin 2.2, AST 50, ALT 77, total protein 5.7 lactic acid 5.0, WBC 17.9.  CT chest abdomen and pelvis pending at time of assessment.   Pertinent  Medical History  Glaucoma  Significant Hospital Events: Including procedures, antibiotic start and stop dates in addition to other pertinent events   08/01/2024 presented from [facility] with significant hypoxia, AMS, and coffee-ground emesis.  Intubated on ED arrival with shock state requiring vasopressor support  Interim History / Subjective:   No acute events overnight On 6 of levophed  On PSV trial GI considering bedside EGD  Objective    Blood pressure 107/69, pulse (!) 112, temperature 97.9 F (36.6 C), resp. rate 20, height 5' 4 (1.626 m), weight 44 kg, SpO2 95%.    Vent Mode: PRVC FiO2 (%):  [50 %-100 %] 50 % Set Rate:  [18 bmp-20 bmp] 20 bmp Vt Set:  [440 mL-470 mL] 440 mL PEEP:  [5 cmH20] 5 cmH20 Plateau Pressure:  [17 cmH20-21 cmH20] 17 cmH20   Intake/Output Summary (Last 24 hours) at 08/02/2024 9196 Last data filed at 08/02/2024 0600 Gross per 24 hour  Intake 3006.4 ml  Output 408 ml  Net 2598.4 ml   Filed Weights   08/01/24 2030 08/02/24 0500  Weight: 44 kg 44 kg    Examination: General: Acute on chronically ill appearing cachetic elderly female lying in bed on mechanical ventilation, in NAD HEENT: ETT, MM pink/moist, PERRL,  Neuro: PERRL, following commands - moving extremities CV: s1s2 regular rate and rhythm, no murmur, rubs, or gallops,  PULM:  Clear to auscultation bilaterally, no increased work of breathing, no added breath sounds GI: soft, mildly distended Extremities: warm/dry, no edema  Skin: no rashes or lesions  Resolved problem list   Assessment and Plan  Septic Shock due to possible UTI -Patient presented with hypothermia, tachypnea, and hypoxia in the setting of positive lactic acid of 5.0 and leukocytosis.  -Possible gastric outlet obstruction per CT ABD - Possible UTI based on UA P: Follow cultures including blood and urine Empiric Vanco and Zosyn  Continue pressors for MAP goal greater than 65 Trend lactic acid Monitor urine output  Acute Hypoxic Respiratory Failure  -Per EMS patient had oxygen saturation of 50 to 60% on room air requiring bag mask assist ventilation on their arrival.  Patient emergently intubated in ED P: Continue ventilator support with lung protective strategies  Wean PEEP and FiO2 for sats greater than 90%. Head of bed elevated 30 degrees. Plateau pressures less than 30 cm H20.  Follow intermittent chest x-ray and ABG.   SAT/SBT as tolerated, waiting to extubate until after EGD Ensure adequate pulmonary hygiene  Follow cultures  VAP bundle in place  PAD protocol Empiric antibiotics as above  Concern for GI bleed with possible gastric outlet obstruction  on CT  -Reports of coffee-ground emesis at SNF, transfused 1 unit PRBCs 2/4 in ER P: Trend CBC Transfuse per protocol Hemoglobin goal greater than 7 Monitor for ongoing signs of bleeding PPI GI following, plan for bedside EGD  Elevated LFTs P: Trend LFTs Avoid hepatotoxins CT abdomen without acute issues of liver/gallbladder  Concern  for protein calorie malnutrition Hypoalbuminemia P: RD consult await for protein calorie malnutrition  Essential hypertension - Medication reconciliation not yet completed but it appears patient utilizes Norvasc  at baseline: P: Hold home medications given shock state  History of glaucoma P: Once medication reconciliation complete resume home eyedrops  Large cystic mass left lower abd - Large complex cystic mass in the left lower abdomen, new since 10/30/19, measuring 18.9 cm. P: OB/GYN consulted   Labs   CBC: Recent Labs  Lab 08/01/24 1612 08/01/24 1640 08/01/24 1642 08/02/24 0319  WBC  --  17.9*  --  16.0*  NEUTROABS  --  15.5*  --   --   HGB 10.9*  10.5* 10.4* 10.5* 12.9  HCT 32.0*  31.0* 33.2* 31.0* 38.4  MCV  --  102.8*  --  92.5  PLT  --  353  --  366    Basic Metabolic Panel: Recent Labs  Lab 08/01/24 1612 08/01/24 1640 08/01/24 1642 08/02/24 0319  NA 135  135 135 137 134*  K 4.6  4.6 4.9 3.8 4.3  CL 100 98  --  101  CO2  --  23  --  23  GLUCOSE 108* 109*  --  139*  BUN 29* 28*  --  28*  CREATININE 1.10* 0.89  --  0.86  CALCIUM  --  8.6*  --  8.1*  MG  --   --   --  2.1  PHOS  --   --   --  3.4   GFR: Estimated Creatinine Clearance: 32 mL/min (by C-G formula based on SCr of 0.86 mg/dL). Recent Labs  Lab 08/01/24 1612 08/01/24 1640 08/01/24 2104 08/02/24 0319  WBC  --  17.9*  --  16.0*  LATICACIDVEN 5.0*  --  3.7*  --     Liver Function Tests: Recent Labs  Lab 08/01/24 1640  AST 50*  ALT 77*  ALKPHOS 418*  BILITOT 0.6  PROT 5.7*  ALBUMIN 2.2*   No results for input(s): LIPASE, AMYLASE in the last 168 hours. No results for input(s): AMMONIA in the last 168 hours.  ABG    Component Value Date/Time   PHART 7.369 08/01/2024 1642   PCO2ART 48.0 08/01/2024 1642   PO2ART 159 (H) 08/01/2024 1642   HCO3 27.7 08/01/2024 1642   TCO2 29 08/01/2024 1642   ACIDBASEDEF 1.0 08/01/2024 1612   O2SAT 99 08/01/2024 1642      Coagulation Profile: Recent Labs  Lab 08/01/24 1640  INR 1.0    Cardiac Enzymes: No results for input(s): CKTOTAL, CKMB, CKMBINDEX, TROPONINI in the last 168 hours.  HbA1C: No results found for: HGBA1C  CBG: Recent Labs  Lab 08/01/24 2021 08/01/24 2325 08/02/24 0350 08/02/24 0734  GLUCAP 157* 168* 129* 134*   Critical care time:   CRITICAL CARE Performed by: Dorn KATHEE Chill   Total critical care time: 35 minutes  Critical care time was exclusive of separately billable procedures and treating other patients.  Critical care was necessary to treat or prevent imminent or life-threatening deterioration.  Critical care was time spent personally by me on the following activities: development of treatment plan with  patient and/or surrogate as well as nursing, discussions with consultants, evaluation of patient's response to treatment, examination of patient, obtaining history from patient or surrogate, ordering and performing treatments and interventions, ordering and review of laboratory studies, ordering and review of radiographic studies, pulse oximetry and re-evaluation of patient's condition.  Dorn Chill, MD [facility] Office: [phone]   See Amion for personal pager PCCM on call pager [phone] until 7pm. Please call Elink 7p-7a. [phone]            "

## 2024-08-02 NOTE — Consult Note (Signed)
 "                                               Consultation Note   Referring Provider:  PCCM PCP: Jame Maude FALCON, MD (Inactive) Primary Gastroenterologist:   [facility]      Reason for Consultation:  Upper GI bleed DOA: 08/01/2024         Hospital Day: 2  Assessment:: # 89 yo female with acute hypoxic respiratory failure , sepsis , vomiting / coffee ground emesis in setting of large hiatal hernia causing gastric outlet obstruction.   Bleeding could have been 2/2 to ischemia if had gastric volvulus. Also erosive esophagitis or even esophagus necrosis. Diffuse wall thickening and hyperenhancement of esophagus on CT scan. Surgery has evaluated. Patient is at very high risk for hernia repair. Unfortunately, this could happen again in the future.  Upper GI bleeding has stopped.  No bloody NG tube output overnight.  Stool light brown DRE.  Hemoglobin on admission was 10.9, it remained stable in the 10 range though she did get a unit of blood and now hemoglobin is 12.9 today.    # Large cystic lower abdominal mass on CT scan  Appears indistinct from uterus. Concerning for neoplasm.   # Elevated liver chemistries, mainly alkaline phosphatase Alk phos 418, AST 50, ALT 77 normal.  Other than multiple hepatic cysts, liver unremarkable CT. Gallbladder unremarkable other than distention, no biliary duct dilation.  Etiology of elevated alk phos not clear at this point  # See PMH for any additional medical history  / medical problems   Plan: -- Continue IV pantoprazole  --GYN evaluation of large cystic mass -- Patient being weaned from sedation.  I have sent a message to critical care team asking them to please keep patient intubated as we are considering need for bedside upper endoscopy today.  Unfortunately there is no family available to help with consent.  Apparently patient is estranged from her family   Principal Problem:   Sepsis (HCC)  HPI:   History comes from chart.   89 yo  female brought to ED from [facility] yesterday with AMS, hypoxia , coffee ground emesis, hypotension and hypothermia. At some point she became unresponsive requiring intubation. In ED she had elevated white count and lactic acid. CT scan notable for possible GOO 2/2 to large HH containing most of stomach with mesenteroaxial rotation  Admitting hemoglobin  10.4 (last recorded hemoglobin was 10.5 in 2021.  BUN 28 Seen by Surgery yesterday. She is at very high risk for surgery  CT chest Diffuse wall thickening and mucosal hyperenhancement of the esophagus compatible with esophagitis.  CT angio A/P Large complex cystic mass in the left lower abdomen, new since 10/30/19, measuring 18.9 cm. The origin is unclear but appears distinct from the uterus, with the most likely source being adnexal/ovarian concerning for low-grade cystic neoplasm. Gynecology consultation recommended. 2. Large hiatal hernia containing most of the stomach in the chest, with mesenteroaxial rotation and possible gastric outlet obstruction.   Recent Labs    08/01/24 1640  PROT 5.7*  ALBUMIN 2.2*  AST 50*  ALT 77*  ALKPHOS 418*  BILITOT 0.6   Recent Labs    08/01/24 1640 08/01/24 1642 08/02/24 0319  WBC 17.9*  --  16.0*  HGB 10.4* 10.5* 12.9  HCT 33.2* 31.0* 38.4  MCV 102.8*  --  92.5  PLT 353  --  366   Recent Labs    08/01/24 1612 08/01/24 1640 08/01/24 1642 08/02/24 0319  NA 135  135 135 137 134*  K 4.6  4.6 4.9 3.8 4.3  CL 100 98  --  101  CO2  --  23  --  23  GLUCOSE 108* 109*  --  139*  BUN 29* 28*  --  28*  CREATININE 1.10* 0.89  --  0.86  CALCIUM  --  8.6*  --  8.1*     Review of Systems: Unable to obtain  Physical Exam: Vital signs in last 24 hours: Temp:  [93.2 F (34 C)-98.4 F (36.9 C)] 93.2 F (34 C) (02/05 0815) Pulse Rate:  [96-147] 117 (02/05 0830) Resp:  [15-24] 18 (02/05 0830) BP: (81-133)/(62-93) 93/64 (02/05 0830) SpO2:  [89 %-100 %] 94 % (02/05 0830) FiO2 (%):  [40  %-100 %] 40 % (02/05 0800) Weight:  [44 kg] 44 kg (02/05 0500)   General:  Awake, intubate female in NAD Psych:  Cooperative.  Eyes: Pupils equal Ears:  Normal auditory acuity Nose: No deformity, discharge or lesions Neck:  Supple, no masses felt Lungs:  Chest clear to auscultation.  Heart:  Sinus tachycardia.   Abdomen:  Mid abdomen is distended, somewhat firm,  flat to percussion, hypoactive bowel sounds Rectal :  Soft, light brown stool in vault Msk: Symmetrical without gross deformities.  Neurologic:  Alert, oriented, grossly normal neurologically Extremities : No edema Skin:  Intact without significant lesions.   OUTPATIENT MEDICATIONS Prior to Admission medications  Medication Sig Start Date End Date Taking? Authorizing Provider  acetaminophen  (TYLENOL ) 500 MG tablet Take 1,000 mg by mouth in the morning and at bedtime.   Yes [provider]  amLODipine  (NORVASC ) 10 MG tablet Take 10 mg by mouth daily.   Yes [provider]  latanoprost  (XALATAN ) 0.005 % ophthalmic solution Place 1 drop into both eyes at bedtime.   Yes [provider]  magnesium oxide (MAG-OX) 400 (240 Mg) MG tablet Take 400 mg by mouth daily.   Yes [provider]  Nutritional Supplements (NUTRITIONAL SHAKE) LIQD Take 120 mLs by mouth in the morning and at bedtime.   Yes [provider]  ondansetron  (ZOFRAN -ODT) 4 MG disintegrating tablet Take 4 mg by mouth 3 (three) times daily before meals. 07/31/24  Yes [provider]  pantoprazole  (PROTONIX ) 40 MG tablet Take 1 tablet (40 mg total) by mouth daily at 6 (six) AM. 11/03/19  Yes Akula, Vijaya, MD  polyethylene glycol powder (GLYCOLAX /MIRALAX ) 17 GM/SCOOP powder Take 17 g by mouth daily. Dissolve 1 capful (17g) in 4-8 ounces of liquid and take by mouth daily.   Yes [provider]  promethazine (PHENERGAN) 12.5 MG tablet Take 25 mg by mouth every 6 (six) hours as needed for nausea or vomiting. 07/29/24  08/03/24 Yes [provider]  senna-docusate (SENOKOT-S) 8.6-50 MG tablet Take 2 tablets by mouth 2 (two) times daily as needed for mild constipation or moderate constipation.   Yes [provider]  timolol  (TIMOPTIC ) 0.5 % ophthalmic solution Place 1 drop into both eyes 2 (two) times daily.   Yes [provider]    Allergies as of 08/01/2024 - Reviewed 08/01/2024  Allergen Reaction Noted   Clarithromycin Hives 07/15/2009   Penicillin g benzathine Hives 07/15/2009   Penicillins Hives 05/28/2024   Sulfamethoxazole-trimethoprim Hives 07/15/2009    INPATIENT MEDICATIONS Current Facility-Administered Medications  Medication Dose Route Frequency  Provider Last Rate Last Admin   0.9 %  sodium chloride  infusion  250 mL Intravenous Continuous Simon Lavonia SAILOR, MD 10 mL/hr at 08/02/24 0818 250 mL at 08/02/24 0818   ceFEPIme  (MAXIPIME ) 2 g in sodium chloride  0.9 % 100 mL IVPB  2 g Intravenous Q12H Ogan, Okoronkwo U, MD   Stopped at 08/02/24 9446   Chlorhexidine  Gluconate Cloth 2 % PADS 6 each  6 each Topical Daily Arloa Folks D, NP   6 each at 08/01/24 2009   fentaNYL  (SUBLIMAZE ) bolus via infusion 25-100 mcg  25-100 mcg Intravenous Q15 min PRN Simon Lavonia SAILOR, MD   100 mcg at 08/01/24 1528   fentaNYL  (SUBLIMAZE ) injection 25-50 mcg  25-50 mcg Intravenous Once Lemly, Tatum N, MD       fentaNYL  in NS (17mcg/ml) infusion-PREMIX  0-400 mcg/hr Intravenous Continuous Simon Lavonia SAILOR, MD 2.5 mL/hr at 08/02/24 0800 25 mcg/hr at 08/02/24 0800   midazolam  PF (VERSED ) injection 1-2 mg  1-2 mg Intravenous Q1H PRN Lemly, Tatum N, MD   2 mg at 08/01/24 1529   norepinephrine  (LEVOPHED ) 4mg  in (0.016 mg/mL) premix infusion  0-10 mcg/min Intravenous Titrated Simon Lavonia SAILOR, MD 22.5 mL/hr at 08/02/24 0819 6 mcg/min at 08/02/24 9180   Oral care mouth rinse  15 mL Mouth Rinse Q2H Mohammed, Shahid, MD   15 mL at 08/02/24 0800   Oral care mouth rinse  15 mL Mouth Rinse PRN  Gatha Pence, MD       pantoprazole  (PROTONIX ) injection 80 mg  80 mg Intravenous Q12H Ann Fine, MD   80 mg at 08/02/24 0516   polyethylene glycol (MIRALAX  / GLYCOLAX ) packet 17 g  17 g Per Tube Daily Lemly, Tatum N, MD       polyethylene glycol (MIRALAX  / GLYCOLAX ) packet 17 g  17 g Oral Daily PRN Harris, Whitney D, NP       senna (SENOKOT) tablet 8.6 mg  1 tablet Per Tube BID Lemly, Tatum N, MD       senna (SENOKOT) tablet 8.6 mg  1 tablet Oral BID PRN Arloa Folks D, NP       vancomycin  (VANCOREADY) IVPB 500 mg/100 mL  500 mg Intravenous Q24H Clair Lynwood CROME, RPH         No past medical history on file.  No past surgical history on file.  Family History  Problem Relation Age of Onset   Colon cancer Mother     Social History   Socioeconomic History   Marital status: Widowed    Spouse name: Not on file   Number of children: Not on file   Years of education: Not on file   Highest education level: Not on file  Occupational History   Not on file  Tobacco Use   Smoking status: Never   Smokeless tobacco: Never  Substance and Sexual Activity   Alcohol use: No    Alcohol/week: 0.0 standard drinks of alcohol   Drug use: No   Sexual activity: Not on file  Other Topics Concern   Not on file  Social History Narrative   Not on file   Social Drivers of Health   Tobacco Use: Not on file  Financial Resource Strain: Not on file  Food Insecurity: Unknown (05/29/2024)   Received from [facility]   Epic    Within the past 12 months, you worried that your food would run out before you got money to buy more: Patient declined to  answer    Within the past 12 months, the food you bought just didn't last and you didn't have money to get more. : Patient declined to answer  Transportation Needs: Not on file (05/29/2024)  Physical Activity: Not on file  Stress: Not on file  Social Connections: Not on file  Intimate Partner Violence: Not on file  Depression (EYV7-0): Not  on file  Alcohol Screen: Not on file  Housing: Not on file (05/29/2024)  Utilities: Unknown (05/29/2024)   Received from [facility]   Utilities    In the past 12 months has the electric, gas, oil, or water company threatened to shut off services in your home? : Patient declined to answer  Health Literacy: Not on file    Code Status   Code Status: Full Code   Vina Dasen, NP-C   08/02/2024, 8:45 AM     "

## 2024-08-02 NOTE — Plan of Care (Signed)
  Problem: Education: Goal: Knowledge of General Education information will improve Description: Including pain rating scale, medication(s)/side effects and non-pharmacologic comfort measures Outcome: Progressing   Problem: Clinical Measurements: Goal: Ability to maintain clinical measurements within normal limits will improve Outcome: Progressing Goal: Will remain free from infection Outcome: Progressing Goal: Diagnostic test results will improve Outcome: Progressing Goal: Respiratory complications will improve Outcome: Progressing Goal: Cardiovascular complication will be avoided Outcome: Progressing   Problem: Safety: Goal: Ability to remain free from injury will improve Outcome: Progressing   Problem: Skin Integrity: Goal: Risk for impaired skin integrity will decrease Outcome: Progressing   

## 2024-08-03 ENCOUNTER — Inpatient Hospital Stay (HOSPITAL_COMMUNITY)

## 2024-08-03 LAB — POCT I-STAT 7, (LYTES, BLD GAS, ICA,H+H)
Acid-base deficit: 3 mmol/L — ABNORMAL HIGH (ref 0.0–2.0)
Bicarbonate: 22.1 mmol/L (ref 20.0–28.0)
Calcium, Ion: 1.25 mmol/L (ref 1.15–1.40)
HCT: 33 % — ABNORMAL LOW (ref 36.0–46.0)
Hemoglobin: 11.2 g/dL — ABNORMAL LOW (ref 12.0–15.0)
O2 Saturation: 81 %
Patient temperature: 98.5
Potassium: 3.6 mmol/L (ref 3.5–5.1)
Sodium: 138 mmol/L (ref 135–145)
TCO2: 23 mmol/L (ref 22–32)
pCO2 arterial: 38.3 mmHg (ref 32–48)
pH, Arterial: 7.369 (ref 7.35–7.45)
pO2, Arterial: 47 mmHg — ABNORMAL LOW (ref 83–108)

## 2024-08-03 LAB — CBC
HCT: 35.3 % — ABNORMAL LOW (ref 36.0–46.0)
Hemoglobin: 11.9 g/dL — ABNORMAL LOW (ref 12.0–15.0)
MCH: 31.3 pg (ref 26.0–34.0)
MCHC: 33.7 g/dL (ref 30.0–36.0)
MCV: 92.9 fL (ref 80.0–100.0)
Platelets: 297 10*3/uL (ref 150–400)
RBC: 3.8 MIL/uL — ABNORMAL LOW (ref 3.87–5.11)
RDW: 19.1 % — ABNORMAL HIGH (ref 11.5–15.5)
WBC: 19.2 10*3/uL — ABNORMAL HIGH (ref 4.0–10.5)
nRBC: 0 % (ref 0.0–0.2)

## 2024-08-03 LAB — BASIC METABOLIC PANEL WITH GFR
Anion gap: 13 (ref 5–15)
BUN: 30 mg/dL — ABNORMAL HIGH (ref 8–23)
CO2: 22 mmol/L (ref 22–32)
Calcium: 8.4 mg/dL — ABNORMAL LOW (ref 8.9–10.3)
Chloride: 100 mmol/L (ref 98–111)
Creatinine, Ser: 0.94 mg/dL (ref 0.44–1.00)
GFR, Estimated: 58 mL/min — ABNORMAL LOW
Glucose, Bld: 87 mg/dL (ref 70–99)
Potassium: 4.2 mmol/L (ref 3.5–5.1)
Sodium: 135 mmol/L (ref 135–145)

## 2024-08-03 LAB — GLUCOSE, CAPILLARY
Glucose-Capillary: 102 mg/dL — ABNORMAL HIGH (ref 70–99)
Glucose-Capillary: 103 mg/dL — ABNORMAL HIGH (ref 70–99)
Glucose-Capillary: 110 mg/dL — ABNORMAL HIGH (ref 70–99)
Glucose-Capillary: 111 mg/dL — ABNORMAL HIGH (ref 70–99)
Glucose-Capillary: 120 mg/dL — ABNORMAL HIGH (ref 70–99)
Glucose-Capillary: 67 mg/dL — ABNORMAL LOW (ref 70–99)

## 2024-08-03 LAB — MAGNESIUM: Magnesium: 2.1 mg/dL (ref 1.7–2.4)

## 2024-08-03 LAB — CULTURE, BLOOD (ROUTINE X 2)
Culture: NO GROWTH
Culture: NO GROWTH
Special Requests: ADEQUATE

## 2024-08-03 LAB — URINE CULTURE: Culture: >=100000 — AB

## 2024-08-03 LAB — HEPATIC FUNCTION PANEL
ALT: 35 U/L (ref 0–44)
AST: 19 U/L (ref 15–41)
Albumin: 1.8 g/dL — ABNORMAL LOW (ref 3.5–5.0)
Alkaline Phosphatase: 262 U/L — ABNORMAL HIGH (ref 38–126)
Bilirubin, Direct: 0.2 mg/dL (ref 0.0–0.2)
Indirect Bilirubin: 0.2 mg/dL — ABNORMAL LOW (ref 0.3–0.9)
Total Bilirubin: 0.4 mg/dL (ref 0.0–1.2)
Total Protein: 4.9 g/dL — ABNORMAL LOW (ref 6.5–8.1)

## 2024-08-03 LAB — PRO BRAIN NATRIURETIC PEPTIDE: Pro Brain Natriuretic Peptide: 1656 pg/mL — ABNORMAL HIGH

## 2024-08-03 LAB — LACTIC ACID, PLASMA: Lactic Acid, Venous: 2.2 mmol/L (ref 0.5–1.9)

## 2024-08-03 LAB — AEROBIC/ANAEROBIC CULTURE W GRAM STAIN (SURGICAL/DEEP WOUND)

## 2024-08-03 MED ORDER — DEXTROSE IN LACTATED RINGERS 5 % IV SOLN: INTRAVENOUS | Status: AC

## 2024-08-03 MED ORDER — METRONIDAZOLE 500 MG/100ML IV SOLN
500.0000 mg | Freq: Two times a day (BID) | INTRAVENOUS | Status: AC
  Administered 2024-08-03 (×2): 500 mg via INTRAVENOUS
  Filled 2024-08-03 (×2): qty 100

## 2024-08-03 MED ORDER — LACTATED RINGERS IV BOLUS
500.0000 mL | Freq: Once | INTRAVENOUS | Status: AC
  Administered 2024-08-03: 500 mL via INTRAVENOUS

## 2024-08-03 MED ORDER — ORAL CARE MOUTH RINSE: 15.0000 mL | OROMUCOSAL | Status: AC | PRN

## 2024-08-03 MED ORDER — VANCOMYCIN HCL 500 MG/100ML IV SOLN
500.0000 mg | INTRAVENOUS | Status: AC
  Administered 2024-08-03: 500 mg via INTRAVENOUS
  Filled 2024-08-03: qty 100

## 2024-08-03 MED ORDER — TIMOLOL MALEATE 0.5 % OP SOLN
1.0000 [drp] | Freq: Two times a day (BID) | OPHTHALMIC | Status: AC
  Administered 2024-08-03 (×2): 1 [drp] via OPHTHALMIC
  Filled 2024-08-03 (×2): qty 5

## 2024-08-03 MED ORDER — LATANOPROST 0.005 % OP SOLN
1.0000 [drp] | Freq: Every day | OPHTHALMIC | Status: AC
  Administered 2024-08-03: 1 [drp] via OPHTHALMIC
  Filled 2024-08-03: qty 2.5

## 2024-08-03 NOTE — Progress Notes (Signed)
 "   Referring Physician(s): Dr. Jayne  Supervising Physician: Karalee Beat  Patient Status:  [facility] - In-pt  Chief Complaint: Septic Shock S/p drainage catheter placement into left adnexal cystic mass  Subjective: Patient was upright and asleep upon arrival. She awakens to external stimulus. Patient able to track me around the room but was not able to follow commands. Patient was not sedated and was on heated high flow. Not able to obtain thorough ROS due to mental status.  Allergies: Clarithromycin, Penicillin g benzathine, Penicillins, and Sulfamethoxazole-trimethoprim  Medications: Prior to Admission medications  Medication Sig Start Date End Date Taking? Authorizing Provider  acetaminophen  (TYLENOL ) 500 MG tablet Take 1,000 mg by mouth in the morning and at bedtime.   Yes [provider]  amLODipine  (NORVASC ) 10 MG tablet Take 10 mg by mouth daily.   Yes [provider]  latanoprost  (XALATAN ) 0.005 % ophthalmic solution Place 1 drop into both eyes at bedtime.   Yes [provider]  magnesium oxide (MAG-OX) 400 (240 Mg) MG tablet Take 400 mg by mouth daily.   Yes [provider]  Nutritional Supplements (NUTRITIONAL SHAKE) LIQD Take 120 mLs by mouth in the morning and at bedtime.   Yes [provider]  ondansetron  (ZOFRAN -ODT) 4 MG disintegrating tablet Take 4 mg by mouth 3 (three) times daily before meals. 07/31/24  Yes [provider]  pantoprazole  (PROTONIX ) 40 MG tablet Take 1 tablet (40 mg total) by mouth daily at 6 (six) AM. 11/03/19  Yes Akula, Vijaya, MD  polyethylene glycol powder (GLYCOLAX /MIRALAX ) 17 GM/SCOOP powder Take 17 g by mouth daily. Dissolve 1 capful (17g) in 4-8 ounces of liquid and take by mouth daily.   Yes [provider]  promethazine (PHENERGAN) 12.5 MG tablet Take 25 mg by mouth every 6 (six) hours as needed for nausea or vomiting. 07/29/24 08/03/24 Yes [provider]  senna-docusate  (SENOKOT-S) 8.6-50 MG tablet Take 2 tablets by mouth 2 (two) times daily as needed for mild constipation or moderate constipation.   Yes [provider]  timolol  (TIMOPTIC ) 0.5 % ophthalmic solution Place 1 drop into both eyes 2 (two) times daily.   Yes [provider]     Vital Signs: BP 100/63   Pulse 100   Temp 98.5 F (36.9 C) (Oral)   Resp 16   Ht 5' 4 (1.626 m)   Wt 99 lb 10.4 oz (45.2 kg)   SpO2 96%   BMI 17.10 kg/m   Physical Exam Vitals and nursing note reviewed.  Constitutional:      General: She is not in acute distress.    Appearance: She is not ill-appearing, toxic-appearing or diaphoretic.  HENT:     Head: Normocephalic and atraumatic.  Cardiovascular:     Rate and Rhythm: Tachycardia present.  Pulmonary:     Effort: Pulmonary effort is normal. No respiratory distress.  Abdominal:     General: Bowel sounds are normal. There is distension.     Palpations: Abdomen is soft. There is no mass.     Tenderness: There is no abdominal tenderness. There is no guarding.     Comments: Insertion site unremarkable with no evidence of erythema, edema, purulent drainage or signs of infection. Insertion site was dry and intact. Sutures were in place. Bandage was not soiled and was in place. Drain flushes and aspirates easily. Approximately 20 mL of serous fluid was present in the bulb. JP drain was appropriately charged.    Skin:  General: Skin is warm and dry.  Neurological:     Comments: Patient able to track me in the room but does not respond to questions or follow commands.     Imaging: IR US  ABSCESS DRAIN PLACEMENT Result Date: 08/03/2024 INDICATION: LEFT adnexal cystic mass EXAM: ULTRASOUND-GUIDED LEFT ADNEXAL CYSTIC MASS DRAINAGE CATHETER PLACEMENT COMPARISON:  CT AP, 08/02/2023. MEDICATIONS: The patient is currently admitted to the hospital and on intravenous antibiotics. Antibiotics were administered within an appropriate time frame prior to skin  puncture. ANESTHESIA/SEDATION: Local anesthetic was administered. The patient was continuously monitored during the procedure by the interventional radiology nurse under my direct supervision. CONTRAST:  None FLUOROSCOPY: None COMPLICATIONS: None immediate. PROCEDURE: Informed written consent was obtained from the patient and/or patient's representative after a discussion of the risks, benefits and alternatives to treatment. Questions regarding the procedure were encouraged and answered. A timeout was performed prior to the initiation of the procedure. The LEFT pelvis was prepped and draped in the usual sterile fashion, and a sterile drape was applied covering the operative field. Maximum barrier sterile technique with sterile gowns and gloves were used for the procedure. A timeout was performed prior to the initiation of the procedure. Local anesthesia was provided with 1% lidocaine  with epinephrine. Ultrasound scanning of the LEFT pelvis demonstrated a large minimally-vascular mass extending from the LEFT pelvis. The mass exhibits a anechoic simple-appearing supernatant, and a dependent heterogeneously hypoechoic component. A Yueh needle was advanced into the pelvic mass under direct ultrasound guidance. An ultrasound image was saved for documentation purposes. Appropriate intraluminal puncture was confirmed with the efflux of fluid and a 0.035 inch wire into the pelvic mass. Over a short Amplatz wire, a 10 Fr drainage tube was advanced into the pelvic mass, coiled and locked. Fluid was aspirated and several post procedural spot sonographic images were obtained. The catheter was secured to the skin with suture, connected to a drainage bag and a dressing was placed. The patient tolerated the procedure well without immediate post procedural complication. IMPRESSION: 1. Large, heterogeneously-echoic mass extending from the LEFT pelvis, to the mid abdomen. 2. Successful ultrasound-guided placement of a 10 Fr drainage  tube into the LEFT adnexal cystic mass. Thom Hall, MD [facility] Electronically Signed   By: Thom Hall M.D.   On: 08/03/2024 07:44   CT Angio Abd/Pel W and/or Wo Contrast Result Date: 08/01/2024 EXAM: CTA ABDOMEN AND PELVIS WITHOUT AND WITH CONTRAST 08/01/2024 06:01:00 PM TECHNIQUE: CTA images of the abdomen and pelvis without and with intravenous contrast. 75 mL iohexol  (OMNIPAQUE ) 350 MG/ML injection. Three-dimensional MIP/volume rendered formations were performed. Automated exposure control, iterative reconstruction, and/or weight based adjustment of the mA/kV was utilized to reduce the radiation dose to as low as reasonably achievable. COMPARISON: 10/30/2019 CLINICAL HISTORY: hematemesis. AMS. Intubated. Distended abdomen. FINDINGS: VASCULATURE: Aortic atherosclerotic calcification. AORTA: Aortic atherosclerotic calcification. No acute finding. No abdominal aortic aneurysm. No dissection. CELIAC TRUNK: No acute finding. No occlusion or significant stenosis. SUPERIOR MESENTERIC ARTERY: No acute finding. No occlusion or significant stenosis. RENAL ARTERIES: No acute finding. No occlusion or significant stenosis. ILIAC ARTERIES: No acute finding. No occlusion or significant stenosis. LIVER: Innumerable hepatic cysts. GALLBLADDER AND BILE DUCTS: Distended gallbladder. No biliary ductal dilatation. SPLEEN: The spleen is unremarkable. PANCREAS: The pancreas is unremarkable. ADRENAL GLANDS: Bilateral adrenal glands demonstrate no acute abnormality. KIDNEYS, URETERS AND BLADDER: Absent left kidney. Large extrarenal pelvis in the right kidney, slightly increased in size since 2021. Foley catheter and  gas in the bladder. Nonobstructing right renal calculi. No hydronephrosis. No perinephric or periureteral stranding. GI AND BOWEL: Large hiatal hernia containing most of the stomach in the chest. The stomach demonstrates mesenteroaxial rotation with possible  gastric outlet obstruction. Enteric tube tip in the stomach. Moderate stool in the rectum. There is no bowel obstruction. No abnormal bowel wall thickening or distension. REPRODUCTIVE: A large cystic pelvic mass is present, measuring 18.9 x 13.1 x 17.4 cm. This is new since the most recent comparison of 10/30/2019. There is mass effect on the bladder which is displaced to the right as well as the small bowel and colon. The origin of the mass is unclear. It appears distinct from the uterus. Most likely source is an adnexal/ovarian cystic lesion. PERITONEUM AND RETROPERITONEUM: No ascites or free air. LUNG BASE: Findings in the chest reported separately. LYMPH NODES: No lymphadenopathy. BONES AND SOFT TISSUES: Scoliosis. No acute abnormality of the bones. No acute soft tissue abnormality. IMPRESSION: 1. Large complex cystic mass in the left lower abdomen, new since 10/30/19, measuring 18.9 cm. The origin is unclear but appears distinct from the uterus, with the most likely source being adnexal/ovarian concerning for low-grade cystic neoplasm. Gynecology consultation recommended. 2. Large hiatal hernia containing most of the stomach in the chest, with mesenteroaxial rotation and possible gastric outlet obstruction. Electronically signed by: Norman Gatlin MD 08/01/2024 06:37 PM EST RP Workstation: [id number]   CT Chest W Contrast Result Date: 08/01/2024 EXAM: CT CHEST WITH CONTRAST 08/01/2024 06:01:00 PM TECHNIQUE: CT of the chest was performed with the administration of 75 mL of iohexol  (OMNIPAQUE ) 350 MG/ML injection. Multiplanar reformatted images are provided for review. Automated exposure control, iterative reconstruction, and/or weight based adjustment of the mA/kV was utilized to reduce the radiation dose to as low as reasonably achievable. COMPARISON: Comparison with same day x-ray and CT chest 10/16/2024. CLINICAL HISTORY: Eval for pneumonia. Hematemesis, altered mental status. FINDINGS: MEDIASTINUM: Heart and  pericardium: Coronary artery and aortic atherosclerotic calcification. Central airways: Endotracheal tube in the intrathoracic trachea. Layering debris in the trachea and left mainstem bronchus with occlusive debris in the left lower lobe bronchi. Esophagus: Diffuse wall thickening and mucosal hyperenhancement of the esophagus compatible with esophagitis. Hiatal hernia: Large hiatal hernia containing most of the stomach in the chest. There is mesenteraxial rotation of the stomach. Air-fluid level in the stomach. Gastric outlet obstruction is not excluded. LYMPH NODES: No mediastinal, hilar or axillary lymphadenopathy. LUNGS AND PLEURA: Lungs: Emphysema. Scattered centrilobular micronodules and tree-in-bud opacities. Complete atelectasis of the left lower lobe with some low density edema compatible with pneumonia. Pleura: Moderate left pleural effusion. Small right pleural effusion.  No pneumothorax. SOFT TISSUES/BONES: No acute abnormality of the bones or soft tissues. UPPER ABDOMEN: Subdiaphragmatic enteric tube. Innumerable hepatic cysts. No other acute abnormality. IMPRESSION: 1. Complete collapse of the left lower lobe with postobstructive pneumonia. Moderate left pleural effusion. 2. Large hiatal hernia containing most of the stomach in the chest with mesenteraxial rotation and air-fluid level. Gastric outlet obstruction is not excluded. 3. Diffuse wall thickening and mucosal hyperenhancement of the esophagus compatible with esophagitis. Electronically signed by: Norman Gatlin MD 08/01/2024 06:25 PM EST RP Workstation: [id number]   CT Head Wo Contrast Result Date: 08/01/2024 EXAM: CT HEAD WITHOUT CONTRAST 08/01/2024 06:01:00 PM TECHNIQUE: CT of the head was performed without the administration of intravenous contrast. Automated exposure control, iterative reconstruction, and/or weight based adjustment of the mA/kV was utilized to reduce the radiation dose to as low as reasonably achievable.  COMPARISON: 5 / 4  / 21 CLINICAL HISTORY: FINDINGS: BRAIN AND VENTRICLES: No acute hemorrhage. No evidence of acute infarct. No hydrocephalus. No extra-axial collection. No mass effect or midline shift. 5 mm calcified extra-axial lesion along left para midline falx anteriorly. Most likely meningioma. Generalized cerebral volume loss. Scattered hypoattenuating foci in supratentorial white matter, nonspecific, likely chronic microangiopathic changes. Intracranial atherosclerosis. ORBITS: No acute abnormality. SINUSES: Mild scattered paranasal sinus mucosal thickening. Opacification of right mastoid air cells. SOFT TISSUES AND SKULL: No acute soft tissue abnormality. No skull fracture. IMPRESSION: 1. No acute intracranial abnormality. Electronically signed by: Norman Gatlin MD 08/01/2024 06:10 PM EST RP Workstation: [id number]   DG Chest Portable 1 View Result Date: 08/01/2024 CLINICAL DATA:  Status post intubation EXAM: PORTABLE CHEST 1 VIEW COMPARISON:  Oct 30, 2019 FINDINGS: The heart size and mediastinal contours are within normal limits. Endotracheal tube is in grossly good position. Nasogastric tube tip is seen in proximal stomach. Right lung is clear. Minimal left basilar atelectasis or infiltrate is noted with possible small left pleural effusion. The visualized skeletal structures are unremarkable. IMPRESSION: Endotracheal and nasogastric tubes are in grossly good position. Minimal left basilar atelectasis or infiltrate is noted with possible small left pleural effusion. Electronically Signed   By: Lynwood Landy Raddle M.D.   On: 08/01/2024 16:00    Labs:  CBC: Recent Labs    08/01/24 1612 08/01/24 1640 08/01/24 1642 08/02/24 0319  WBC  --  17.9*  --  16.0*  HGB 10.9*  10.5* 10.4* 10.5* 12.9  HCT 32.0*  31.0* 33.2* 31.0* 38.4  PLT  --  353  --  366    COAGS: Recent Labs    08/01/24 1640  INR 1.0    BMP: Recent Labs    08/01/24 1612 08/01/24 1640 08/01/24 1642 08/02/24 0319 08/02/24 2342  NA 135   135 135 137 134* 135  K 4.6  4.6 4.9 3.8 4.3 4.2  CL 100 98  --  101 100  CO2  --  23  --  23 22  GLUCOSE 108* 109*  --  139* 87  BUN 29* 28*  --  28* 30*  CALCIUM  --  8.6*  --  8.1* 8.4*  CREATININE 1.10* 0.89  --  0.86 0.94  GFRNONAA  --  >60  --  >60 58*    LIVER FUNCTION TESTS: Recent Labs    08/01/24 1640  BILITOT 0.6  AST 50*  ALT 77*  ALKPHOS 418*  PROT 5.7*  ALBUMIN 2.2*    Assessment and Plan: S/p drainage catheter placement into left adnexal cystic mass  Drain Location: LLQ Size: Fr size: 10 Fr Date of placement: 07/02/2024  Currently to: Drain collection device: suction bulb 24 hour output:  Output by Drain (mL) 08/01/24 0701 - 08/01/24 1900 08/01/24 1901 - 08/02/24 0700 08/02/24 0701 - 08/02/24 1900 08/02/24 1901 - 08/03/24 0700 08/03/24 0701 - 08/03/24 1600  Closed System Drain 1 Medial;Left LUQ Bulb (JP) 10 Fr.   780 925 325    Interval imaging/drain manipulation:  No drain manipulation or interval imaging since placement.  Current examination: Flushes/aspirates easily.  Insertion site unremarkable. Suture and stat lock in place. Dressed appropriately.   Plan: Continue TID flushes with 5 cc NS. Record output Q shift. Dressing changes QD or PRN if soiled.  Call IR APP or on call IR MD if difficulty flushing or sudden change in drain output.  Repeat imaging/possible drain injection once output < 10 mL/QD (  excluding flush material). Consideration for drain removal if output is < 10 mL/QD (excluding flush material), pending discussion with the providing surgical service.  Discharge planning: Please contact IR APP or on call IR MD prior to patient d/c to ensure appropriate follow up plans are in place. Typically patient will follow up with IR clinic 10-14 days post d/c for repeat imaging/possible drain injection. IR scheduler will contact patient with date/time of appointment. Patient will need to flush drain QD with 5 cc NS, record output QD, dressing  changes every 2-3 days or earlier if soiled.   IR will continue to follow - please call with questions or concerns.     Electronically Signed: Leanor JINNY Forget, PA-C 08/03/2024, 9:09 AM   I spent a total of 25 Minutes at the the patient's bedside AND on the patient's hospital floor or unit, greater than 50% of which was counseling/coordinating care for left adnexal cystic mass.      "

## 2024-08-03 NOTE — Procedures (Signed)
 Extubation Procedure Note  Patient Details:   Name: Jacqueline Mathews DOB: Jan 08, 1935 MRN: [id number]   Airway Documentation:    Vent end date: 08/03/24 Vent end time: 1049   Evaluation  O2 sats: stable throughout Complications: No apparent complications Patient did tolerate procedure well. Bilateral Breath Sounds: Clear   Yes Pt was successfully extubated with no apparent complications. Audible cuffleak heard prior extubation with volume loss noted, No stridor present at this time. Pt VSS on 4L [place] and is tolerating well at this time.  Germain JAYSON Mater 08/03/2024, 10:50 AM

## 2024-08-03 NOTE — Progress Notes (Signed)
 Pharmacy Antibiotic Note  Jacqueline Mathews is a 89 y.o. female admitted on 08/01/2024 with sepsis.  Pharmacy has been consulted for restart Vancomycin  dosing with E. Faecalis in the urine and adnexal abscess fluid pending. WBC is elevated and trending up. CrCl ~30-35 mL/min. Last Vancomycin  2/4 at 1600 PM.   Plan: Restart Vancomycin  500 mg IV q24h >>>Estimated AUC: 422 Remains on Cefepime  + Flagyl  fro abscess and Pseudomonas in urine Trend WBC, temp, renal function  F/U infectious work-up Drug levels as indicated   Height: 5' 4 (162.6 cm) Weight: 45.2 kg (99 lb 10.4 oz) IBW/kg (Calculated) : 54.7  Temp (24hrs), Avg:98 F (36.7 C), Min:97.2 F (36.2 C), Max:99 F (37.2 C)  Recent Labs  Lab 08/01/24 1612 08/01/24 1640 08/01/24 2104 08/02/24 0319 08/02/24 1011 08/02/24 1342 08/02/24 2342 08/03/24 0914  WBC  --  17.9*  --  16.0*  --   --   --  19.2*  CREATININE 1.10* 0.89  --  0.86  --   --  0.94  --   LATICACIDVEN 5.0*  --  3.7*  --  2.8* 2.4*  --  2.2*    Estimated Creatinine Clearance: 30.1 mL/min (by C-G formula based on SCr of 0.94 mg/dL).    Allergies[1]  CFP 2/5 >>  Flagyl  2/6 >> Vanco 2/5 x1    2/4 BCX >> NGTD x 12h  2/4 MRSA PCR -  2/4 UCX >100K PsA + E. Faecalis 2/5 Fluid aspirate > mod GNR  Harlene Boga, PharmD Please refer to [facility] for [facility] numbers 08/03/2024, 2:46 PM       [1]  Allergies Allergen Reactions   Clarithromycin Hives   Penicillin G Benzathine Hives   Penicillins Hives   Sulfamethoxazole-Trimethoprim Hives

## 2024-08-03 NOTE — Plan of Care (Signed)
" °  Problem: Clinical Measurements: Goal: Respiratory complications will improve Outcome: Progressing   Problem: Education: Goal: Knowledge of General Education information will improve Description: Including pain rating scale, medication(s)/side effects and non-pharmacologic comfort measures Outcome: Not Progressing   Problem: Clinical Measurements: Goal: Will remain free from infection Outcome: Not Progressing   Problem: Nutrition: Goal: Adequate nutrition will be maintained Outcome: Not Progressing   Problem: Skin Integrity: Goal: Risk for impaired skin integrity will decrease Outcome: Not Progressing   "

## 2024-08-03 NOTE — Progress Notes (Signed)
 "  General Surgery Follow Up Note  Subjective:    Overnight Issues:   Objective:  Vital signs for last 24 hours: Temp:  [97.2 F (36.2 C)-98.6 F (37 C)] 98.5 F (36.9 C) (02/06 0811) Pulse Rate:  [85-131] 101 (02/06 0915) Resp:  [11-23] 21 (02/06 0930) BP: (68-127)/(48-81) 115/76 (02/06 0930) SpO2:  [87 %-100 %] 97 % (02/06 0915) FiO2 (%):  [40 %] 40 % (02/06 0806) Weight:  [45.2 kg] 45.2 kg (02/06 0500)  Hemodynamic parameters for last 24 hours:    Intake/Output from previous day: 02/05 0701 - 02/06 0700 In: 2079.9 [I.V.:1979.9; IV Piggyback:100] Out: 2060 [Urine:305; Drains:1705]  Intake/Output this shift: Total I/O In: 308.4 [I.V.:278; IV Piggyback:30.4] Out: 340 [Urine:40; Drains:300]  Vent settings for last 24 hours: Vent Mode: PSV;CPAP FiO2 (%):  [40 %] 40 % Set Rate:  [20 bmp] 20 bmp Vt Set:  [440 mL] 440 mL PEEP:  [5 cmH20] 5 cmH20 Pressure Support:  [5 cmH20-8 cmH20] 5 cmH20 Plateau Pressure:  [12 cmH20-17 cmH20] 17 cmH20  Physical Exam:  Gen: comfortable, no distress Neuro: follows commands HEENT: PERRL Neck: supple CV: RRR Pulm: unlabored breathing on mechanical ventilation Abd: soft, NT , JP purulent GU: urine clear and yellow, +Foley Extr: wwp, no edema  Results for orders placed or performed during the hospital encounter of 08/01/24 (from the past 24 hours)  Lactic acid, plasma     Status: Abnormal   Collection Time: 08/02/24 10:11 AM  Result Value Ref Range   Lactic Acid, Venous 2.8 (HH) 0.5 - 1.9 mmol/L  Blood transfusion report - scanned     Status: None ()   Collection Time: 08/02/24 11:10 AM   Narrative   Ordered by an unspecified provider.  Blood transfusion report - scanned     Status: None   Collection Time: 08/02/24 11:10 AM   Narrative   Ordered by an unspecified provider.  Glucose, capillary     Status: Abnormal   Collection Time: 08/02/24 11:28 AM  Result Value Ref Range   Glucose-Capillary 100 (H) 70 - 99 mg/dL  Lactic  acid, plasma     Status: Abnormal   Collection Time: 08/02/24  1:42 PM  Result Value Ref Range   Lactic Acid, Venous 2.4 (HH) 0.5 - 1.9 mmol/L  Aerobic/Anaerobic Culture w Gram Stain (surgical/deep wound)     Status: None (Preliminary result)   Collection Time: 08/02/24  3:20 PM   Specimen: Path fluid; Body Fluid  Result Value Ref Range   Specimen Description FLUID    Special Requests ASPIRATE    Gram Stain      MODERATE WBC PRESENT, PREDOMINANTLY PMN MODERATE GRAM NEGATIVE RODS Performed at [facility], [address], [place], [place] [address]    Culture PENDING    Report Status PENDING   Glucose, capillary     Status: Abnormal   Collection Time: 08/02/24  4:00 PM  Result Value Ref Range   Glucose-Capillary 106 (H) 70 - 99 mg/dL  Glucose, capillary     Status: None   Collection Time: 08/02/24  7:33 PM  Result Value Ref Range   Glucose-Capillary 94 70 - 99 mg/dL  Glucose, capillary     Status: None   Collection Time: 08/02/24 11:18 PM  Result Value Ref Range   Glucose-Capillary 81 70 - 99 mg/dL  Basic metabolic panel     Status: Abnormal   Collection Time: 08/02/24 11:42 PM  Result Value Ref Range   Sodium 135  135 - 145 mmol/L   Potassium 4.2 3.5 - 5.1 mmol/L   Chloride 100 98 - 111 mmol/L   CO2 22 22 - 32 mmol/L   Glucose, Bld 87 70 - 99 mg/dL   BUN 30 (H) 8 - 23 mg/dL   Creatinine, Ser 9.05 0.44 - 1.00 mg/dL   Calcium 8.4 (L) 8.9 - 10.3 mg/dL   GFR, Estimated 58 (L) >60 mL/min   Anion gap 13 5 - 15  Magnesium     Status: None   Collection Time: 08/02/24 11:42 PM  Result Value Ref Range   Magnesium 2.1 1.7 - 2.4 mg/dL  Pro Brain natriuretic peptide     Status: Abnormal   Collection Time: 08/02/24 11:42 PM  Result Value Ref Range   Pro Brain Natriuretic Peptide 1,656.0 (H) <300.0 pg/mL  Glucose, capillary     Status: Abnormal   Collection Time: 08/03/24  3:31 AM  Result Value Ref Range   Glucose-Capillary 67 (L) 70 - 99 mg/dL  Glucose, capillary      Status: Abnormal   Collection Time: 08/03/24  7:37 AM  Result Value Ref Range   Glucose-Capillary 110 (H) 70 - 99 mg/dL  CBC     Status: Abnormal   Collection Time: 08/03/24  9:14 AM  Result Value Ref Range   WBC 19.2 (H) 4.0 - 10.5 K/uL   RBC 3.80 (L) 3.87 - 5.11 MIL/uL   Hemoglobin 11.9 (L) 12.0 - 15.0 g/dL   HCT 64.6 (L) 63.9 - 53.9 %   MCV 92.9 80.0 - 100.0 fL   MCH 31.3 26.0 - 34.0 pg   MCHC 33.7 30.0 - 36.0 g/dL   RDW 80.8 (H) 88.4 - 84.4 %   Platelets 297 150 - 400 K/uL   nRBC 0.0 0.0 - 0.2 %    Assessment & Plan: The plan of care was discussed with the bedside nurse for the day, who is in agreement with this plan and no additional concerns were raised.   Present on Admission:  Sepsis (HCC)    LOS: 2 days   Additional comments:I reviewed the patient's new clinical lab test results.   and I reviewed the patients new imaging test results.    Assessment    Jacqueline Mathews is an 89 y.o. female who presented from SNF with AMS, hypoxia, and c/f GI bleed. Found to have large cystic mass on LLQ concerning for gynecologic origin as well as large hiatal hernia with GOO.   Plan  - Patient is very high risk for surgery. Please see NSQIP calculator below. 62.1% risk of death. I am in agreement with Dr. Cindi assessment and think that risks of surgery are too high to consider surgery at this time to be an acceptable option. Patient with no identifiable family and patient may be extubatable later today. If so, a realistic discussion with the patient would be most appropriate.  - s/p IR drainage 2/5 of L adnexal cystic mass with 1.7L purulent drainage and additional 300cc out this AM.  - s/p EGD 2/5 with endoscopic de-torsion of the gastric volvulus and no ischemic changes noted - Protonix  80 BID given GI bleed - FEN - strict NPO, IVF per CCM, consider TPN - Continue OGT  - DVT - SCDs, LMWH per primary team recommendations - Dispo - ICU, remainder of care per CCM        Dreama GEANNIE Hanger, MD Trauma & General Surgery Please use [email or web address] to contact on call provider  08/03/2024  *  Care during the described time interval was provided by me. I have reviewed this patient's available data, including medical history, events of note, physical examination and test results as part of my evaluation.  "

## 2024-08-03 NOTE — Progress Notes (Signed)
 "  NAME:  Jacqueline Mathews, MRN:  [id number], DOB:  24-Aug-1934, LOS: 2 ADMISSION DATE:  08/01/2024, CONSULTATION DATE:  08/01/2024 REFERRING MD: ED, CHIEF COMPLAINT: Septic shock  History of Present Illness:  Jacqueline Mathews is a 89 year old female with past medical history significant for HTN and glaucoma who presented to the ED via EMS from [facility] with reports of AMS, hypoxia , and coffee-ground emesis.  On EMS arrival patient was seen with oxygen saturation 50% per bag mask assisted ventilations were provided.  Later patient became unresponsive and route to ED prompting emergent endotracheal intubation on admission.  Additionally patient was seen hypothermic and tachycardic on admission.  Lab work significant for alkaline phosphatase 418 albumin 2.2, AST 50, ALT 77, total protein 5.7 lactic acid 5.0, WBC 17.9.  CT chest abdomen and pelvis pending at time of assessment.   Pertinent  Medical History  Glaucoma  Significant Hospital Events: Including procedures, antibiotic start and stop dates in addition to other pertinent events   08/01/2024 presented from [facility] with significant hypoxia, AMS, and coffee-ground emesis.  Intubated on ED arrival with shock state requiring vasopressor support 08/02/2024 - S/p IR drainage of L adenexal cystic mass with 500 cc purulent drainage                  - S/p EGD endoscopic reduction of hiatal hernia and de-torsiongastric volvulus and no ischemic changes noted   Interim History / Subjective:  S/p IR drainage of L adenexal cystic mass with 500 cc purulent drainage  S/p EGD endoscopic reduction of hiatal hernia and de-torsion of the gastric volvulus and no ischemic changes noted  Overnight brief runs of SVT. Levophed  was changed to Neo and metoprolol  PRN ordered.  Objective    Blood pressure (!) 103/59, pulse 98, temperature (!) 97.4 F (36.3 C), temperature source Axillary, resp. rate 20, height 5' 4 (1.626 m), weight 45.2 kg, SpO2 97%.    Vent  Mode: PRVC FiO2 (%):  [40 %] 40 % Set Rate:  [20 bmp] 20 bmp Vt Set:  [440 mL] 440 mL PEEP:  [5 cmH20] 5 cmH20 Pressure Support:  [8 cmH20] 8 cmH20 Plateau Pressure:  [12 cmH20-17 cmH20] 17 cmH20   Intake/Output Summary (Last 24 hours) at 08/03/2024 0759 Last data filed at 08/03/2024 0600 Gross per 24 hour  Intake 1929.86 ml  Output 2060 ml  Net -130.14 ml   Filed Weights   08/01/24 2030 08/02/24 0500 08/03/24 0500  Weight: 44 kg 44 kg 45.2 kg   Physical Exam: General: Chronically ill, cachetic-appearing, no acute distress HENT: [place], AT, ETT in place Eyes: EOMI, no scleral icterus Respiratory: Clear to auscultation bilaterally.  No crackles, wheezing or rales Cardiovascular: RRR, -M/R/G, no JVD GI: BS+, soft, nontender Extremities:-Edema,-tenderness Neuro: Eyes, open intermittently follows commands, CNII-XII grossly intact GU: Foley in place  Imaging, labs and test in EMR in the last 24 hours reviewed independently by me. Pertinent findings below:  BMET, normal Cr L Adnexal abscess - moderate GNR Bcx 2/4 NGTD Ucx sent  Resolved problem list   Assessment and Plan  Septic Shock due to pelvic abscess +/- pna - improving shock -Patient presented with hypothermia, tachypnea, and hypoxia in the setting of positive lactic acid of 5.0 and leukocytosis.  -Possible gastric outlet obstruction per CT ABD - Possible UTI based on UA P: Follow cultures including pelvic, blood and urine DC Vanc. Continue Cefepime . Add Flagyl  Wean off Neo for MAP goal >65 Continue D5LR  gtt x 24 hours Trend LA Monitor urine output  Acute Hypoxic Respiratory Failure 2/2 post-obstructive pna, LLL collapse, left pleural effusion Emphysema -Per EMS patient had oxygen saturation of 50 to 60% on room air requiring bag mask assist ventilation on their arrival.  Patient emergently intubated in ED P: Continue ventilator support with lung protective strategies  Wean PEEP and FiO2 for sats greater than  90%. Head of bed elevated 30 degrees. Plateau pressures less than 30 cm H20.  Follow intermittent chest x-ray and ABG.  CXR today SBT/WUA. Plan to extubate  Ensure adequate pulmonary hygiene  Follow cultures  VAP bundle in place  PAD protocol. DC sedation Antibiotics as above  GI bleed 2/2 early esophageal necrosis/esophagitis in setting of gastric volvulus s/p EGD reduction 2/5 Large hiatal hernia -Reports of coffee-ground emesis at SNF, transfused 1 unit PRBCs 2/4 in ER -High risk for recurrent volvulus P: Trend CBC Transfuse per protocol Hemoglobin goal greater than 7 Monitor for ongoing signs of bleeding PPI GI following Avoid NG tube High risk for hernia repair. Consider PEG Surgery consulted. Patient deemed high risk at this time and warrants discussion with patient once extubated  Elevated LFTs P: Trend LFTs Avoid hepatotoxins CT abdomen without acute issues of liver/gallbladder  Concern for protein calorie malnutrition Hypoalbuminemia P: RD consult await for protein calorie malnutrition  SVT Essential hypertension - Medication reconciliation not yet completed but it appears patient utilizes Norvasc  at baseline: P: Hold home medications PRN BB  History of glaucoma P: Restarted home eye gtts  Large  L adenexal cystic mass - Large complex cystic mass in the left lower abdomen, new since 10/30/19, measuring 18.9 cm. - S/p IR drainage 2/5 with 500 cc purulent drainage  P: OB/GYN following Management as above with antibiotics Drain inplace. 1705 cc output yesterday Consider re-imaging once drainage decreases  GOC Brother listed but notes state estranged from family. Procedures required 2 physician consent.   Critical care time: 48 min   The patient is critically ill with multiple organ systems failure and requires high complexity decision making for assessment and support, frequent evaluation and titration of therapies, application of advanced monitoring  technologies and extensive interpretation of multiple databases.  Independent Critical Care Time: 48 Minutes.   Slater Staff, M.D. [facility] Pulmonary/Critical Care Medicine 08/03/2024 7:59 AM   Please see Amion for pager number to reach on-call Pulmonary and Critical Care Team.           "

## 2024-08-03 NOTE — Progress Notes (Signed)
 Pt placed on PS/CPAP mode on ventilator and is tolerating well at this time.   08/03/24 0806  Daily Weaning Assessment  Daily Assessment of Readiness to Wean Wean protocol criteria met (SBT performed)  SBT Method CPAP 5 cm H20 and PS 5 cm H20  Weaning Start Time [phone]

## 2024-08-03 NOTE — Progress Notes (Addendum)
 Pt currently intubated, on levo, and peripheral IV's. Pt is from [facility]. CSW attempted to call pt contact [place] to complete initial assessment. CSW left a VM.  CSW will continue to follow.  Lendia Dais, [facility] Inpatient Care Management (ICM) [phone]

## 2024-08-03 NOTE — Consult Note (Signed)
 GYN FOLLOW UP CONSULT NOTE:  Pt is extubated  Interestingly fluid appeared to be abscess fluid and is growing GNR on cefipime which is difficult to make sens of since it was so large So far has put out 330 cc post procedure 1.7L at initial drain placement by IR It woiuld be hard to imagine an tubo ovarian abscess that got so large, not to mention how it would have developed in the first place,, maybe benign ovarian neoplasm that became infected?  In any event if does recover and considered reasonable candidate for surgery surgical exploration with removal may be indicated in the future  Jacqueline VEAR Inch, MD 08/03/2024 2:21 PM

## 2024-08-03 NOTE — Progress Notes (Signed)
 eLink Physician-Brief Progress Note Patient Name: Jacqueline Mathews DOB: 05-28-1935 MRN: [id number]   Date of Service  08/03/2024  HPI/Events of Note  CBG  81, patient is not receiving any calories.  eICU Interventions  D5 LR gtt ordered to run at 50 ml / hour.        Clariza Sickman U Stonewall Doss 08/03/2024, 12:52 AM
